# Patient Record
Sex: Female | Born: 1935
Health system: Southern US, Community
[De-identification: ages and names within clinical notes are randomized; demographics above are authoritative.]

## PROBLEM LIST (undated history)

## (undated) DIAGNOSIS — J302 Other seasonal allergic rhinitis: Secondary | ICD-10-CM

## (undated) DIAGNOSIS — D649 Anemia, unspecified: Secondary | ICD-10-CM

## (undated) DIAGNOSIS — I4891 Unspecified atrial fibrillation: Secondary | ICD-10-CM

## (undated) DIAGNOSIS — K922 Gastrointestinal hemorrhage, unspecified: Secondary | ICD-10-CM

## (undated) DIAGNOSIS — F419 Anxiety disorder, unspecified: Secondary | ICD-10-CM

## (undated) DIAGNOSIS — I350 Nonrheumatic aortic (valve) stenosis: Secondary | ICD-10-CM

## (undated) DIAGNOSIS — M1990 Unspecified osteoarthritis, unspecified site: Secondary | ICD-10-CM

## (undated) DIAGNOSIS — G4730 Sleep apnea, unspecified: Secondary | ICD-10-CM

## (undated) DIAGNOSIS — I10 Essential (primary) hypertension: Secondary | ICD-10-CM

## (undated) DIAGNOSIS — K2970 Gastritis, unspecified, without bleeding: Secondary | ICD-10-CM

## (undated) HISTORY — DX: Other seasonal allergic rhinitis: J30.2

## (undated) HISTORY — DX: Unspecified osteoarthritis, unspecified site: M19.90

## (undated) HISTORY — PX: CHOLECYSTECTOMY: SHX55

## (undated) HISTORY — DX: Anemia, unspecified: D64.9

## (undated) HISTORY — DX: Sleep apnea, unspecified: G47.30

## (undated) HISTORY — DX: Essential (primary) hypertension: I10

## (undated) HISTORY — DX: Nonrheumatic aortic (valve) stenosis: I35.0

## (undated) HISTORY — PX: TOTAL HIP ARTHROPLASTY: SHX124

## (undated) HISTORY — DX: Anxiety disorder, unspecified: F41.9

## (undated) HISTORY — PX: HEMORRHOID SURGERY: SHX153

## (undated) HISTORY — PX: PACEMAKER INSERTION: SHX728

---

## 1997-06-21 ENCOUNTER — Ambulatory Visit: Admission: RE | Admit: 1997-06-21 | Discharge: 1997-06-21 | Payer: Self-pay | Admitting: Cardiology

## 1999-07-23 ENCOUNTER — Encounter: Payer: Self-pay | Admitting: Cardiology

## 1999-07-23 ENCOUNTER — Ambulatory Visit (HOSPITAL_COMMUNITY): Admission: RE | Admit: 1999-07-23 | Discharge: 1999-07-23 | Payer: Self-pay | Admitting: Cardiology

## 2000-07-21 ENCOUNTER — Ambulatory Visit (HOSPITAL_COMMUNITY): Admission: RE | Admit: 2000-07-21 | Discharge: 2000-07-21 | Payer: Self-pay | Admitting: Family Medicine

## 2000-07-21 ENCOUNTER — Encounter: Payer: Self-pay | Admitting: Family Medicine

## 2001-04-26 ENCOUNTER — Emergency Department (HOSPITAL_COMMUNITY): Admission: EM | Admit: 2001-04-26 | Discharge: 2001-04-27 | Payer: Self-pay | Admitting: *Deleted

## 2001-07-23 ENCOUNTER — Ambulatory Visit (HOSPITAL_COMMUNITY): Admission: RE | Admit: 2001-07-23 | Discharge: 2001-07-23 | Payer: Self-pay | Admitting: Family Medicine

## 2001-07-23 ENCOUNTER — Encounter: Payer: Self-pay | Admitting: Family Medicine

## 2002-02-17 ENCOUNTER — Ambulatory Visit (HOSPITAL_COMMUNITY): Admission: RE | Admit: 2002-02-17 | Discharge: 2002-02-17 | Payer: Self-pay | Admitting: Specialist

## 2002-02-17 ENCOUNTER — Encounter: Payer: Self-pay | Admitting: Family Medicine

## 2002-04-22 ENCOUNTER — Encounter: Payer: Self-pay | Admitting: Orthopedic Surgery

## 2002-04-27 ENCOUNTER — Encounter: Payer: Self-pay | Admitting: Orthopedic Surgery

## 2002-04-27 ENCOUNTER — Inpatient Hospital Stay (HOSPITAL_COMMUNITY): Admission: RE | Admit: 2002-04-27 | Discharge: 2002-05-03 | Payer: Self-pay | Admitting: Orthopedic Surgery

## 2002-04-28 ENCOUNTER — Encounter: Payer: Self-pay | Admitting: Orthopedic Surgery

## 2002-05-03 ENCOUNTER — Inpatient Hospital Stay (HOSPITAL_COMMUNITY)
Admission: RE | Admit: 2002-05-03 | Discharge: 2002-05-10 | Payer: Self-pay | Admitting: Physical Medicine & Rehabilitation

## 2002-12-06 ENCOUNTER — Ambulatory Visit (HOSPITAL_COMMUNITY): Admission: RE | Admit: 2002-12-06 | Discharge: 2002-12-06 | Payer: Self-pay | Admitting: Family Medicine

## 2003-11-16 ENCOUNTER — Ambulatory Visit (HOSPITAL_COMMUNITY): Admission: RE | Admit: 2003-11-16 | Discharge: 2003-11-16 | Payer: Self-pay | Admitting: Family Medicine

## 2003-12-26 ENCOUNTER — Ambulatory Visit (HOSPITAL_COMMUNITY): Admission: RE | Admit: 2003-12-26 | Discharge: 2003-12-26 | Payer: Self-pay | Admitting: Family Medicine

## 2004-02-29 ENCOUNTER — Ambulatory Visit: Payer: Self-pay | Admitting: Internal Medicine

## 2004-03-10 ENCOUNTER — Emergency Department (HOSPITAL_COMMUNITY): Admission: EM | Admit: 2004-03-10 | Discharge: 2004-03-10 | Payer: Self-pay | Admitting: Emergency Medicine

## 2004-06-09 ENCOUNTER — Inpatient Hospital Stay (HOSPITAL_COMMUNITY): Admission: RE | Admit: 2004-06-09 | Discharge: 2004-06-11 | Payer: Self-pay | Admitting: Family Medicine

## 2004-06-21 ENCOUNTER — Ambulatory Visit (HOSPITAL_COMMUNITY): Admission: RE | Admit: 2004-06-21 | Discharge: 2004-06-21 | Payer: Self-pay | Admitting: General Surgery

## 2004-07-03 ENCOUNTER — Ambulatory Visit: Payer: Self-pay | Admitting: Internal Medicine

## 2004-10-15 ENCOUNTER — Ambulatory Visit: Payer: Self-pay | Admitting: Cardiology

## 2004-10-15 ENCOUNTER — Inpatient Hospital Stay (HOSPITAL_COMMUNITY): Admission: AD | Admit: 2004-10-15 | Discharge: 2004-10-17 | Payer: Self-pay | Admitting: Cardiology

## 2004-10-16 ENCOUNTER — Ambulatory Visit: Payer: Self-pay | Admitting: Cardiology

## 2004-10-16 ENCOUNTER — Encounter: Payer: Self-pay | Admitting: Cardiology

## 2004-10-31 ENCOUNTER — Ambulatory Visit: Payer: Self-pay

## 2005-02-05 ENCOUNTER — Ambulatory Visit: Payer: Self-pay | Admitting: Internal Medicine

## 2005-10-31 ENCOUNTER — Ambulatory Visit: Payer: Self-pay | Admitting: Cardiology

## 2005-12-13 ENCOUNTER — Ambulatory Visit: Payer: Self-pay | Admitting: Internal Medicine

## 2006-02-26 ENCOUNTER — Ambulatory Visit: Payer: Self-pay | Admitting: Internal Medicine

## 2006-05-21 ENCOUNTER — Ambulatory Visit: Payer: Self-pay | Admitting: Internal Medicine

## 2006-08-13 ENCOUNTER — Ambulatory Visit: Payer: Self-pay | Admitting: Internal Medicine

## 2006-11-06 ENCOUNTER — Ambulatory Visit: Payer: Self-pay | Admitting: Cardiovascular Disease

## 2006-11-10 ENCOUNTER — Emergency Department (HOSPITAL_COMMUNITY): Admission: EM | Admit: 2006-11-10 | Discharge: 2006-11-10 | Payer: Self-pay | Admitting: Emergency Medicine

## 2007-02-16 ENCOUNTER — Ambulatory Visit: Payer: Self-pay | Admitting: Internal Medicine

## 2007-05-20 ENCOUNTER — Ambulatory Visit: Payer: Self-pay | Admitting: Internal Medicine

## 2007-09-22 ENCOUNTER — Ambulatory Visit: Payer: Self-pay | Admitting: Internal Medicine

## 2007-11-25 ENCOUNTER — Ambulatory Visit: Payer: Self-pay | Admitting: Internal Medicine

## 2008-01-27 ENCOUNTER — Ambulatory Visit: Payer: Self-pay | Admitting: Internal Medicine

## 2008-02-10 ENCOUNTER — Encounter (INDEPENDENT_AMBULATORY_CARE_PROVIDER_SITE_OTHER): Payer: Self-pay | Admitting: *Deleted

## 2008-03-28 ENCOUNTER — Ambulatory Visit: Payer: Self-pay | Admitting: Internal Medicine

## 2008-06-27 ENCOUNTER — Ambulatory Visit: Payer: Self-pay | Admitting: Internal Medicine

## 2008-07-01 ENCOUNTER — Encounter: Payer: Self-pay | Admitting: Internal Medicine

## 2008-07-01 ENCOUNTER — Ambulatory Visit: Payer: Self-pay | Admitting: Cardiology

## 2008-09-27 ENCOUNTER — Ambulatory Visit: Payer: Self-pay | Admitting: Internal Medicine

## 2008-12-27 ENCOUNTER — Encounter: Payer: Self-pay | Admitting: Internal Medicine

## 2008-12-27 ENCOUNTER — Ambulatory Visit: Payer: Self-pay | Admitting: Internal Medicine

## 2009-01-19 ENCOUNTER — Ambulatory Visit (HOSPITAL_COMMUNITY): Admission: RE | Admit: 2009-01-19 | Discharge: 2009-01-19 | Payer: Self-pay | Admitting: Internal Medicine

## 2009-03-20 ENCOUNTER — Ambulatory Visit: Payer: Self-pay | Admitting: Internal Medicine

## 2009-03-20 DIAGNOSIS — I10 Essential (primary) hypertension: Secondary | ICD-10-CM

## 2009-03-20 DIAGNOSIS — Z950 Presence of cardiac pacemaker: Secondary | ICD-10-CM

## 2009-03-20 DIAGNOSIS — R001 Bradycardia, unspecified: Secondary | ICD-10-CM

## 2009-06-27 ENCOUNTER — Ambulatory Visit: Payer: Self-pay | Admitting: Internal Medicine

## 2009-09-21 ENCOUNTER — Encounter: Payer: Self-pay | Admitting: Internal Medicine

## 2009-09-21 ENCOUNTER — Ambulatory Visit: Payer: Self-pay | Admitting: Cardiology

## 2009-10-03 ENCOUNTER — Encounter: Payer: Self-pay | Admitting: Internal Medicine

## 2009-10-03 ENCOUNTER — Ambulatory Visit: Payer: Self-pay | Admitting: Internal Medicine

## 2010-01-02 ENCOUNTER — Ambulatory Visit: Payer: Self-pay | Admitting: Internal Medicine

## 2010-02-13 ENCOUNTER — Telehealth (INDEPENDENT_AMBULATORY_CARE_PROVIDER_SITE_OTHER): Payer: Self-pay | Admitting: *Deleted

## 2010-02-20 NOTE — Cardiovascular Report (Signed)
Summary: Transtelephonic Pacemaker Monitoring Report  Transtelephonic Pacemaker Monitoring Report   Imported By: Debby Freiberg 07/19/2009 14:07:51  _____________________________________________________________________  External Attachment:    Type:   Image     Comment:   External Document

## 2010-02-20 NOTE — Procedures (Signed)
Summary: 6 MTH F/U PER CHECKOUT ON 03/20/2009/TG   Current Medications (verified): 1)  Aspirin 81 Mg Tbec (Aspirin) .... Take One Tablet By Mouth Daily 2)  Oscal 500/200 D-3 500-200 Mg-Unit Tabs (Calcium-Vitamin D) .Marland Kitchen.. 1 Tablet Daily 3)  Iron 325 (65 Fe) Mg Tabs (Ferrous Sulfate) .Marland Kitchen.. 1 Tablet Daily 4)  Atenolol 25 Mg Tabs (Atenolol) .... Take 1 Tablet By Mouth Two Times A Day 5)  Simvastatin 20 Mg Tabs (Simvastatin) .... One By Mouth Daily 6)  Lisinopril 40 Mg Tabs (Lisinopril) .... One By Mouth Daily 7)  Hydrochlorothiazide 12.5 Mg Caps (Hydrochlorothiazide) .... One By Mouth Daily 8)  Amlodipine Besylate 10 Mg Tabs (Amlodipine Besylate) .... One By Mouth Daily 9)  Tylenol Arthritis Pain 650 Mg Cr-Tabs (Acetaminophen) .... As Needed  Allergies (verified): No Known Drug Allergies   PPM Specifications Following MD:  Lewayne Bunting, MD     PPM Vendor:  Medtronic     PPM Model Number:  303B     PPM Serial Number:  [id number] PPM DOI:  10/16/2004      Lead 1    Location: RA     DOI: 10/16/2004     Model #: 4098     Serial #: [id number]     Status: active Lead 2    Location: RV     DOI: 10/16/2004     Model #: 6213     Serial #: [id number]     Status: active  Magnet Response Rate:  BOL 85 ERI 65  Indications:  CHB  Explantation Comments:  pacemaker dependent  PPM Follow Up Remote Check?  No Battery Voltage:  2.79 V     Battery Est. Longevity:  6.5 years     Pacer Dependent:  Yes       PPM Device Measurements Atrium  Amplitude: 2.8 mV, Impedance: 524 ohms, Threshold: 1.0 V at 0.4 msec Right Ventricle  Impedance: 565 ohms, Threshold: 1.0 V at 0.4 msec  Episodes MS Episodes:  23     Percent Mode Switch:  0.1%     Coumadin:  No Ventricular High Rate:  1     Atrial Pacing:  14.2%     Ventricular Pacing:  99.6%  Parameters Mode:  DDD     Lower Rate Limit:  60     Upper Rate Limit:  120 Paced AV Delay:  150     Sensed AV Delay:  120 Next Cardiology Appt Due:  03/22/2010 Tech  Comments:  No parameter changes.  Device function normal.  The longest mode switch was 1:20 hours, - coumadin.  ROV 6 months with Dr. [name] in [facility]. Altha Harm, LPN  September 21, 2009 3:49 PM

## 2010-02-20 NOTE — Cardiovascular Report (Signed)
Summary: TTM   TTM   Imported By: Roderic Ovens 10/18/2009 11:27:12  _____________________________________________________________________  External Attachment:    Type:   Image     Comment:   External Document

## 2010-02-20 NOTE — Cardiovascular Report (Signed)
Summary: Office Visit   Office Visit   Imported By: Roderic Ovens 10/09/2009 15:40:02  _____________________________________________________________________  External Attachment:    Type:   Image     Comment:   External Document

## 2010-02-20 NOTE — Assessment & Plan Note (Signed)
Summary: pc2   Visit Type:  Follow-up Primary Provider:  Dr.Fanta  CC:  no complaints.  History of Present Illness: [name] returns today for followup.  I saw her last one year ago. At that time, she was in normal sinus rhythm and we prescribed her some atenolol.  She returns today for followup.  She denies chest pain.  Her dyspnea is present with exertion, but not at rest.  She notes that her blood pressure remains elevated. She has some fatigue but denies peripheral edema. No syncope.  Current Medications (verified): 1)  Aspirin 81 Mg Tbec (Aspirin) .... Take One Tablet By Mouth Daily 2)  Oscal 500/200 D-3 500-200 Mg-Unit Tabs (Calcium-Vitamin D) .Marland Kitchen.. 1 Tablet Daily 3)  Exforge 5-320 Mg Tabs (Amlodipine Besylate-Valsartan) .Marland Kitchen.. 1 Tablet Daily 4)  Iron 325 (65 Fe) Mg Tabs (Ferrous Sulfate) .Marland Kitchen.. 1 Tablet Daily 5)  Celebrex 200 Mg Caps (Celecoxib) .Marland Kitchen.. 1 Tablet Daily 6)  Atenolol 25 Mg Tabs (Atenolol) .... Take 1 Tablet By Mouth Two Times A Day 7)  Diazepam 2 Mg Tabs (Diazepam) .... Take As Directed  Allergies (verified): No Known Drug Allergies  Past History:  Past Medical History: Last updated: 06/28/2008 Degenerative joint disease Osteoporsis anxiety Sleep Apnea Seasonal allergies Anemia HypertensiON Implantation of Medtronic DDD dual chamber pacemaker  Past Surgical History: Last updated: 06/28/2008 Hemorrhoidectomy Cholecystectomy Hip Arthroplasty-Total  Review of Systems       The patient complains of dyspnea on exertion.  The patient denies chest pain, syncope, and peripheral edema.    Vital Signs:  Patient profile:   77 year old female Height:      63 inches Weight:      187 pounds Pulse rate:   108 / minute BP sitting:   148 / 83  (right arm)  Vitals Entered By: Ashley Saa, CNA (March 20, 2009 8:25 AM)  Physical Exam  General:  Elderly, well developed, well nourished, in no acute distress.  HEENT: normal Neck: supple. No JVD. Carotids 2+  bilaterally no bruits Cor: RRR with normal S1 and S2.  A soft systolic murmur is present at the RUSB c/w aortic sclerosis Lungs: CTA Ab: soft, nontender. nondistended. No HSM. Good bowel sounds Ext: warm. no cyanosis, clubbing or edema Neuro: alert and oriented. Grossly nonfocal. affect pleasant    PPM Specifications Following MD:  Lewayne Bunting, MD     PPM Vendor:  Medtronic     PPM Model Number:  303B     PPM Serial Number:  [id number] PPM DOI:  10/16/2004      Lead 1    Location: RA     DOI: 10/16/2004     Model #: 9147     Serial #: [id number]     Status: active Lead 2    Location: RV     DOI: 10/16/2004     Model #: 6578     Serial #: [id number]     Status: active  Magnet Response Rate:  BOL 85 ERI 65  Indications:  CHB  Explantation Comments:  pacemaker dependent  PPM Follow Up Remote Check?  No Battery Voltage:  2.79 V     Battery Est. Longevity:  7 years     Pacer Dependent:  Yes       PPM Device Measurements Atrium  Amplitude: 2.8 mV, Impedance: 542 ohms, Threshold: 1.0 V at 0.4 msec Right Ventricle  Impedance: 559 ohms, Threshold: 0.5 V at 0.4 msec  Episodes MS Episodes:  157  Percent Mode Switch:  0.5%     Coumadin:  No Ventricular High Rate:  2     Atrial Pacing:  18.8%     Ventricular Pacing:  99.7%  Parameters Mode:  DDD     [name] Rate Limit:  60     Upper Rate Limit:  120 Paced AV Delay:  150     Sensed AV Delay:  120 Next Cardiology Appt Due:  08/21/2009 Tech Comments:  No parameter changes.  157 mode switch episodes the longes 62 minutes, she is not on coumadin. He ventricular rates are well controlled but her resting heart rate this morning was 92.   2 VHR episodes the longest 5 seconds.   TTM's with [facility].  ROV 6 months in the [facility] clinic. Ashley Harm, LPN  March 20, 2009 8:43 AM  MD Comments:  Agree with above.  The mode switch rate is under 200/min.  Impression & Recommendations:  Problem # 1:  CARDIAC PACEMAKER IN SITU (ICD-V45.01) Her device  is working normally.  Will recheck in several months.  Problem # 2:  ESSENTIAL HYPERTENSION, BENIGN (ICD-401.1) Her blood pressure remains elevated and her resting HR is up.  I have recommended she increase her atenolol to 25 mg twice daily. Her updated medication list for this problem includes:    Aspirin 81 Mg Tbec (Aspirin) .Marland Kitchen... Take one tablet by mouth daily    Exforge 5-320 Mg Tabs (Amlodipine besylate-valsartan) .Marland Kitchen... 1 tablet daily    Atenolol 25 Mg Tabs (Atenolol) .Marland Kitchen... Take 1 tablet by mouth two times a day  Problem # 3:  BRADYCARDIA (ICD-427.89) This has resolved with insertion of her PPM.  Patient Instructions: 1)  Your physician recommends that you schedule a follow-up appointment in: 6 months device check, 1 yr Dr. Ladona [name] 2)  Your physician has recommended you make the following change in your medication: increase atenolol to twice daily Prescriptions: ATENOLOL 25 MG TABS (ATENOLOL) Take 1 tablet by mouth two times a day  #60 x 3   Entered by:   Ashley Lower RN   Authorized by:   Laren Boom, MD, [facility]   Signed by:   Ashley Lower RN on 03/20/2009   Method used:   Electronically to        [facility]. [phone]* (retail)       [address], [place]  [address]       Ph: [phone]       Fax: [phone]   RxID:   [id number]

## 2010-02-22 NOTE — Cardiovascular Report (Signed)
Summary: TTM   TTM   Imported By: Roderic Ovens 01/19/2010 11:41:36  _____________________________________________________________________  External Attachment:    Type:   Image     Comment:   External Document

## 2010-02-22 NOTE — Progress Notes (Signed)
  Phone Note From Other Clinic   Caller: Dr. Shana Chute Summary of Call: Dr. Delane Ginger called requesting cardiology records prior to 11-25-2007.  Office visits and diagnostic reports were faxed to [phone] Initial call taken by: Onalee Hua

## 2010-03-14 ENCOUNTER — Encounter (INDEPENDENT_AMBULATORY_CARE_PROVIDER_SITE_OTHER): Payer: Medicare Other | Admitting: Internal Medicine

## 2010-03-14 ENCOUNTER — Encounter: Payer: Self-pay | Admitting: Internal Medicine

## 2010-03-14 DIAGNOSIS — E785 Hyperlipidemia, unspecified: Secondary | ICD-10-CM | POA: Insufficient documentation

## 2010-03-14 DIAGNOSIS — I10 Essential (primary) hypertension: Secondary | ICD-10-CM

## 2010-03-14 DIAGNOSIS — E782 Mixed hyperlipidemia: Secondary | ICD-10-CM

## 2010-03-14 DIAGNOSIS — I442 Atrioventricular block, complete: Secondary | ICD-10-CM

## 2010-03-15 ENCOUNTER — Encounter: Payer: Self-pay | Admitting: Internal Medicine

## 2010-03-20 NOTE — Assessment & Plan Note (Signed)
Summary: 1 yr f/u per checkout on 03/20/09/tg/hm   Visit Type:  Follow-up Primary Provider:  Dr.Fanta  CC:  edema in legs at night.  History of Present Illness: [name] returns today for followup.  I saw her last one year ago. At that time, she was in normal sinus rhythm and we prescribed her some atenolol.  She returns today for followup.  She denies chest pain.  Her dyspnea is present with exertion, but not at rest.  She notes that her blood pressure remains elevated. She has some fatigue but denies peripheral edema. No syncope. No other symptoms today.  Current Medications (verified): 1)  Aspirin 81 Mg Tbec (Aspirin) .... Take One Tablet By Mouth Daily 2)  Oscal 500/200 D-3 500-200 Mg-Unit Tabs (Calcium-Vitamin D) .Marland Kitchen.. 1 Tablet Daily 3)  Iron 325 (65 Fe) Mg Tabs (Ferrous Sulfate) .Marland Kitchen.. 1 Tablet Daily 4)  Atenolol 25 Mg Tabs (Atenolol) .... Take 1 Tablet By Mouth Two Times A Day 5)  Simvastatin 20 Mg Tabs (Simvastatin) .... One By Mouth Daily 6)  Lisinopril 40 Mg Tabs (Lisinopril) .... One By Mouth Daily 7)  Hydrochlorothiazide 12.5 Mg Caps (Hydrochlorothiazide) .... One By Mouth Daily 8)  Amlodipine Besylate 10 Mg Tabs (Amlodipine Besylate) .... One By Mouth Daily 9)  Tylenol Arthritis Pain 650 Mg Cr-Tabs (Acetaminophen) .... As Needed  Allergies (verified): No Known Drug Allergies  Comments:  Nurse/Medical Assistant: patient stated she is out of her atenolol wants to know if she needs to stay on it [facility] in [place]  Past History:  Past Medical History: Last updated: 06/28/2008 Degenerative joint disease Osteoporsis anxiety Sleep Apnea Seasonal allergies Anemia HypertensiON Implantation of Medtronic DDD dual chamber pacemaker  Past Surgical History: Last updated: 06/28/2008 Hemorrhoidectomy Cholecystectomy Hip Arthroplasty-Total  Review of Systems  The patient denies chest pain, syncope, dyspnea on exertion, and peripheral edema.    Vital  Signs:  Patient profile:   77 year old female Weight:      178 pounds BMI:     31.65 Pulse rate:   73 / minute BP sitting:   158 / 84  (left arm)  Vitals Entered By: Dreama Saa, CNA (March 14, 2010 8:53 AM)  Physical Exam  General:  Elderly, well developed, well nourished, in no acute distress.  HEENT: normal Neck: supple. No JVD. Carotids 2+ bilaterally no bruits Cor: RRR with normal S1 and S2.  A soft systolic murmur is present at the RUSB c/w aortic sclerosis Lungs: CTA.Well healed PPM incision. Ab: soft, nontender. nondistended. No HSM. Good bowel sounds Ext: warm. no cyanosis, clubbing or edema Neuro: alert and oriented. Grossly nonfocal. affect pleasant    PPM Specifications Following MD:  Lewayne Bunting, MD     PPM Vendor:  Medtronic     PPM Model Number:  303B     PPM Serial Number:  [id number] PPM DOI:  10/16/2004      Lead 1    Location: RA     DOI: 10/16/2004     Model #: 4098     Serial #: [id number]     Status: active Lead 2    Location: RV     DOI: 10/16/2004     Model #: 6213     Serial #: [id number]     Status: active  Magnet Response Rate:  BOL 85 ERI 65  Indications:  CHB  Explantation Comments:  pacemaker dependent  PPM Follow Up Pacer Dependent:  Yes      Episodes Coumadin:  No  Parameters Mode:  DDD     Lower Rate Limit:  60     Upper Rate Limit:  120 Paced AV Delay:  150     Sensed AV Delay:  120 MD Comments:  Normal device function.  Impression & Recommendations:  Problem # 1:  CARDIAC PACEMAKER IN SITU (ICD-V45.01) Her device is working normally. Will recheck in several months.  Problem # 2:  ESSENTIAL HYPERTENSION, BENIGN (ICD-401.1) her blood pressure is moderately elevated but she has not yet taken her meds. Will continue a low sodium diet. Her updated medication list for this problem includes:    Aspirin 81 Mg Tbec (Aspirin) .Marland Kitchen... Take one tablet by mouth daily    Atenolol 25 Mg Tabs (Atenolol) .Marland Kitchen... Take 1 tablet by mouth two  times a day    Lisinopril 40 Mg Tabs (Lisinopril) ..... One by mouth daily    Hydrochlorothiazide 12.5 Mg Caps (Hydrochlorothiazide) ..... One by mouth daily    Amlodipine Besylate 10 Mg Tabs (Amlodipine besylate) ..... One by mouth daily  Problem # 3:  DYSLIPIDEMIA (ICD-272.4)  She will continue her current meds. I have asked her to reduce her fat intake. Her updated medication list for this problem includes:    Simvastatin 20 Mg Tabs (Simvastatin) ..... One by mouth daily  Her updated medication list for this problem includes:    Simvastatin 20 Mg Tabs (Simvastatin) ..... One by mouth daily  Patient Instructions: 1)  Your physician recommends that you schedule a follow-up appointment in: [name] in 6 months and [name] in 1 year. 2)  Your physician recommends that you continue on your current medications as directed. Please refer to the Current Medication list given to you today.

## 2010-04-03 ENCOUNTER — Encounter: Payer: Self-pay | Admitting: Internal Medicine

## 2010-04-03 DIAGNOSIS — I442 Atrioventricular block, complete: Secondary | ICD-10-CM

## 2010-06-05 NOTE — Assessment & Plan Note (Signed)
[facility]                         ELECTROPHYSIOLOGY OFFICE NOTE   [name], [name]                         MRN:          [id number]  DATE:11/25/2007                            DOB:          Dec 09, 1920    [name] returns today for followup.  She is a very pleasant elderly  woman with the history of complete heart block, status post pacemaker  insertion.  She has a history of hypertension and returns today for  followup.  She notes that she has had more fatigue and weakness lately,  otherwise no specific complaints.  The patient has remained somewhat  tachycardic.  I saw her back 2 years ago when her heart rate was in the  mid 90s.  Today, her heart rate is between 100-105 beats per minute at  rest.  She had no specific complaints, otherwise.   CURRENT MEDICATIONS:  1. Alprazolam 0.5 a day.  2. Aspirin 81 a day.  3. Iron supplements daily.  4. Simvastatin 40 a day.   PHYSICAL EXAMINATION:  GENERAL:  She is a pleasant elderly-appearing  woman, in no acute distress.  VITAL SIGNS:  Blood pressure 135/90, the pulse was 110 and regular, the  respirations were 20, and the weight was 200 pounds.  NECK:  No jugular venous distention.  LUNGS:  Clear bilaterally to auscultation.  No wheezes, rales, or  rhonchi are present.  CARDIOVASCULAR:  Regular rate and rhythm.  Normal S1 and S2.  There is a  soft systolic murmur at left lower sternal border.  There is also a soft  S4 gallop present.  ABDOMEN:  Soft, nontender.  There is no organomegaly.  EXTREMITIES:  Demonstrated no peripheral edema.   Interrogation of her pacemaker demonstrates a Medtronic Sigma, the P-  waves are greater than 2.  There are no R-waves secondary to complete  heart block.  The impedance was 511 in the A and 545 in the V, the  threshold 0.5 at 0.4 in the atrium and 1.4 in the right ventricle.  Battery voltage was 2.79 volts.  She was 99% V-paced and 4% A-paced.  Today, we tried  to see if her atrial rate was in fact, secondary to  atrial tach by performing a rapid atrial pacing at rates of up to 180  beats per minute.  Despite this, we could not slow her heart down below  100 beats per minute.  It appears that this is in fact, sinus  tachycardia, although certainly could still be atrial tach driving her  atrial rate above 100.   IMPRESSION:  1. Complete heart block, status post pacemaker.  2. Hypertension.  3. Sinus bradycardia.   DISCUSSION:  Today, I have asked [name] to start a low-dose beta-  blocker.  We will see how she does with this.  She has initially been  given a prescription for atenolol 25 half tablet daily for a week with  uptitration to 1 tablet daily thereafter.  I will see her back in 2  months.  She  has got a scheduled follow up with Dr. Donia Guiles,  who is her  primary cardiologist in 1 month.  We had also given the patient a flu  vaccine today.     [name]. [name], MD  Electronically Signed    GWT/MedQ  DD: 11/25/2007  DT: 11/26/2007  Job #: [id number]   cc:   Osvaldo Shipper. Spruill, M.D.

## 2010-06-05 NOTE — Assessment & Plan Note (Signed)
[facility]                         ELECTROPHYSIOLOGY OFFICE NOTE   [name], [name]                         MRN:          [id number]  DATE:01/27/2008                            DOB:          April 19, 1920    [name] returns today for followup.  I saw her last several months ago  and at that time, she was sinus tachycardic and we prescribed her some  atenolol, she took it for a month and she had no refills and so she  stopped taking it.  She returns today for followup.  She denies chest  pain.  Her dyspnea is present with exertion, but not at rest.  She notes  that her blood pressure remains elevated.   MEDICATIONS:  1. Alprazolam 0.5 a day.  2. Aspirin 81 a day.  3. Os-Cal daily.  4. Iron supplements.  5. Simvastatin 40 a day.  6. Tekturna HCTZ 300/12.5 daily.  7. Celebrex 200 a day p.r.n.   PHYSICAL EXAMINATION:  GENERAL:  She is a pleasant elderly woman in no  acute distress.  VITAL SIGNS:  Blood pressure today was 166/73, the pulse was 65 and  regular, and respirations were 18.  Weight was 197 pounds.  NECK:  No jugular venous distention.  LUNGS:  Clear bilaterally to auscultation.  No wheezes, rales, or  rhonchi are present.  There is no increased work of breathing.  CARDIOVASCULAR:  Regular rate and rhythm.  Normal S1 and S2.  ABDOMEN:  Soft and nontender.  EXTREMITIES:  No edema.   Interrogation of her pacemaker demonstrates Medtronic Sigma.  The P-  waves were greater than 2.  There are no R-waves secondary to complete  heart block.  The impedance was 497 in the A and 545 in the V.  The  threshold was 1 volt at 0.4 in the atrium.  Underlying atrial rhythm was  sinus tach.  Battery voltage is 2.79 volts.  Her EKG when the pacemaker  was turned down to VVI 30 demonstrates sinus tachycardia with complete  heart block.   IMPRESSION:  1. Complete heart block.  2. Hypertension.  3. Status post pacemaker insertion.   DISCUSSION:  [name] is stable.  Her blood pressure remains elevated  and her sinus rate is high.  So for this reason, I asked her to start  her atenolol back to 20 or 25 a day.  We will see  her back in the office for pacemaker followup in 1 year.  I have asked  that she continue her atenolol on a regular basis for the next year.     [name]. [name], MD  Electronically Signed    GWT/MedQ  DD: 01/27/2008  DT: 01/28/2008  Job #: [id number]

## 2010-06-05 NOTE — Assessment & Plan Note (Signed)
[name]                         ELECTROPHYSIOLOGY OFFICE NOTE   [name]                         MRN:          [id number]  DATE:05/20/2007                            DOB:          Apr 04, 1920    [name] returns today for followup.  She is a very pleasant, elderly  woman with a history of complete heart block who I initially saw back in  September of 2006 who underwent permanent pacemaker insertion shortly  thereafter.  She has done well with this and returns today for followup.  She does have some very mild fatigue which is consistent with her 77  years of age.   MEDICATIONS:  1. Her medications include alprazolam 0.5 mg daily.  2. Aspirin 81 a day.  3. Os-Cal.  4. Iron supplements.  5. Simvastatin 40 a day.   PHYSICAL EXAM:  GENERAL:  She is a pleasant, elderly woman in no  distress.  VITAL SIGNS:  Blood pressure 154/90, the pulse 90 and regular,  respirations were 18.  Weight was 201 pounds.  NECK:  Revealed no jugular venous distention.  LUNGS:  Clear bilaterally to auscultation.  No wheezes, rales or  rhonchi.  CARDIAC:  Exam revealed a regular rate and rhythm with normal S1 and a  split S2.  I did not appreciate a murmur today.  EXTREMITIES:  Demonstrated no peripheral edema.  There was no cyanosis  or clubbing.   Interrogation of her pacemaker demonstrates a Medtronic sigma.  The P-  waves were greater than 2.  There are no R waves secondary to complete  heart block.  The impedance 497 in the A and 586 in the V.  Threshold  0.5 at 0.4 in the right atrium and the right ventricle.  She was 99% V  paced.   IMPRESSION:  1. Complete heart block.  2. Status post pacemaker insertion.  3. Hypertension.   DISCUSSION:  [name] is stable.  Her pacemaker is working normally.  We  will plan to see her back in the office in 1 year for pacemaker  followup.     [name]. Ashley Ridgel, MD  Electronically Signed    GWT/MedQ  DD:  05/20/2007  DT: 05/20/2007  Job #: [id number]

## 2010-06-05 NOTE — Assessment & Plan Note (Signed)
[facility]                         ELECTROPHYSIOLOGY OFFICE NOTE   ANIVEA, VELASQUES                         MRN:          [id number]  DATE:11/06/2006                            DOB:          [birth year]    Ms. Wickes was seen in the [place] clinic on October 16 for followup of  her Medtronic model number 303B. Date of implant was October 16, 2004  for complete heart block. On interrogation of her device today, her  battery voltage is 2.79. T waves measured greater than 2.8 millivolts  with atrial capture threshold of 1 volt at 0.4 milliseconds and an  atrial lead impedance of 554 ohms. R waves were not measured. She is  pacemaker dependent to a rate of 30 with ventricular capture threshold  of 0.5 volts at 0.4 milliseconds and a ventricular lead impendence of  598 ohms. There were 120 mode switch episodes noted, the longest one  being 19 minutes long. She is not on Coumadin therapy. Two ventricular  high rate episodes noted. She is ventricularly pacing 99.9% of the time.  She will continue with her phone checks through [facility] with a return  office visit in 6 months' time with Dr. [name].      Altha Harm, LPN  Electronically Signed      [name]. [name], MD  Electronically Signed   PO/MedQ  DD: 11/06/2006  DT: 11/07/2006  Job #: [id number]

## 2010-06-08 NOTE — Op Note (Signed)
NAMEGUILIANA, [name]                            ACCOUNT NO.:  [id number]   MEDICAL RECORD NO.:  [id number]                   PATIENT TYPE:  INP   LOCATION:  0001                                 FACILITY:  [facility]   PHYSICIAN:  Georges Lynch. Darrelyn [name], M.D.             DATE OF BIRTH:  1920-07-11   DATE OF PROCEDURE:  04/27/2002  DATE OF DISCHARGE:                                 OPERATIVE REPORT   SURGEON:  Georges Lynch. Darrelyn [name], M.D.   ASSISTANT:  Marlowe Kays, M.D. and Ebbie Ridge. Paitsel, P.A.   POSTOPERATIVE DIAGNOSIS:  Degenerative arthritis, right hip.   POSTOPERATIVE DIAGNOSIS:  Degenerative arthritis, right hip.   OPERATION:  Right total hip arthroplasty.  The sizes used were a size 50-mm  PSL cup with two screws for fixation.  I utilized a 10-degree polyethelene  insert.  The femur was a size 7 Secur-Fit plus the size of the C-tapered  head was a +5 C-tapered head.   PROCEDURE:  Under general anesthesia, routine orthopedic prep and drape of  the right hip was carried out with the patient on her left side.  She had 1  gram of IV Ancef preop.  At this time, an incision was made over the  posterior lateral aspect of the right hip.  Bleeders identified and  cauterized.  Self-retaining retractors were inserted and following that, the  incision was carried down through the iliotibial band.  The self-retaining  retractors were advanced.  Great care was taken to protect the sciatic nerve  at all times.  I then detached the external rotators in the usual fashion  and then did a capsulectomy.  Once again, the sciatic nerve was protected.  I then dislocated the head and amputated the femoral head at the appropriate  neck length.  I then inserted my guide pin and carried out my reaming and  rasping up to a size 7 right Secur-Fit femoral component.  The distal tip  was reamed to 11.5 mm.  Following this, after we prepared the femoral side  we then completed a capsulectomy, reamed the acetabulum  up to a size 50-mm  cup.  We then inserted our permanent cup at the appropriate angle and  inserted our trial insert, went through a trial run to make sure we had the  proper position of the cup before we fixed the cup in with the screws.  Following this, we then affixed the metal cup in with the two screws.  I  then inserted a polyethelene liner.  We then thoroughly irrigated out the  area and then inserted our size 7 Secur-Fit stem.  Once this was done, we  went through trials again with a +0  and a +5 C-tapered head.  The +5 C-  tapered head was the most stable so we elected to use that.  We then reduced  the head, took the hip through motion,  had excellent stability.  The wound  was thoroughly irrigated and then closed in layers in the usual fashion.  Staples were placed in the skin.  A sterile Neosporin dressing was applied.  The patient left the operating room in satisfactory condition.  She was  placed in a knee immobilizer before leaving the operating room.                                               [name], M.D.    RAG/MEDQ  D:  04/27/2002  T:  04/27/2002  Job:  [id number]

## 2010-06-08 NOTE — Consult Note (Signed)
NAMEELORAH, [name] NO.:  [id number]   MEDICAL RECORD NO.:  [id number]          PATIENT TYPE:  INP   LOCATION:  2928                         FACILITY:  [facility]   PHYSICIAN:  [name]. [name], M.D.  DATE OF BIRTH:  [birth year]   DATE OF CONSULTATION:  10/16/2004  DATE OF DISCHARGE:                                   CONSULTATION   ELECTROPHYSIOLOGY CONSULTATION NOTE   CONSULTATION IS REQUESTED BY:  [place] Bing, M.D. [facility].   INDICATION FOR CONSULTATION:  Evaluation of complete heart block in a  patient for possible pacemaker implantation.   HISTORY OF PRESENT ILLNESS:  The patient is a very pleasant 77 year old  woman with a history of hypertension who was in her usual state of health  until approximately 1 week ago when she developed severe weakness and  shortness of breath. She denies frank syncope. She did not have chest pain.  She continued to feel dizzy and lightheaded as well as short of breath and  finally presented to Dr. Adaline Sill office where an EKG, there, demonstrated  third-degree heart block and she was admitted for additional evaluation and  treatment.   PAST MEDICAL HISTORY:  Her past medical history is notable for hypertension.  She has a history of a remote catheterization several years ago which  apparently did not show any obstructive coronary disease, although I do not  have the details of this at the present time. She denies any frank syncope.  She has longstanding hypertension. She has a history of gastroesophageal  reflux disease and has been on Aciphex. Social history, the patient lives in  [place].  She is a retired Science writer. She denies tobacco or ethanol use.  Family history is notable for mother dying of unknown causes and her father  died at age 64 of stomach cancer. She had a brother who died of unknown  causes as well.   MEDICATIONS:  Her medications include Alprazolam 0.5 daily, Allegra,  Aciphex, Clorcon, Os-Cal,  hydrochlorothiazide, aspirin and Micardis.   REVIEW OF SYSTEMS:  Notable for occasional sweats and nasal discharge. She  has 1 week of weakness, as noted, and a chronic gastroesophageal reflux  disease. She has dyspnea associated with this; and she has arthralgias. The  rest of her review of systems was reviewed and found to be negative.   PHYSICAL EXAMINATION:  GENERAL:  On physical exam she is a pleasant, elderly  appearing woman in no distress.  VITAL SIGNS:  Blood pressure was 129/60.  The pulse was 38 and regular,  respirations were 18. The weight was not recorded. Temperature was 97.  HEENT:  Normocephalic and atraumatic. The pupils are equal and round  oropharynx moist  The sclerae are anicteric.  NECK:  The neck revealed no jugular distension. There is no thyromegaly. The  trachea was midline.  CARDIOVASCULAR EXAM:  Revealed irregular bradycardia with a grade 2/6  systolic murmur at the left lower sternal border.  LUNGS:  Clear bilaterally to auscultation  There are no wheezes, rales or  rhonchi. There is no increased work of breathing.  The abdominal exam was  soft, nontender, nondistended  There was no organomegaly.  EXTREMITIES:  The extremities demonstrated no cyanosis, clubbing or edema.  MUSCULOSKELETAL EXAM:  Demonstrated no joint deformities  NEUROLOGIC EXAM:  She was alert and oriented x3 with cranial nerves intact.  The strength was 5/5 and symmetric.   EKG:  Demonstrates sinus rhythm with complete heart block.   IMPRESSION:  1.  Complete heart block.  2.  Hypertension.   DISCUSSION:  I have discussed the risks, benefits, goals and expectations of  permanent pacemaker implantation with the patient. She wishes to proceed.           ______________________________  [name]. [name], M.D.     GWT/MEDQ  D:  10/16/2004  T:  10/16/2004  Job:  [id number]   cc:   Annia Friendly. Loleta Chance, MD  Fax: [phone]

## 2010-06-08 NOTE — H&P (Signed)
[name], [name]                  ACCOUNT NO.:  [id number]   MEDICAL RECORD NO.:  [id number]          PATIENT TYPE:  AMB   LOCATION:  DAY                           FACILITY:  [facility]   PHYSICIAN:  Annia Friendly. 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  The patient is admitted for  diagnostic colonoscopy and EGD by Dr. Maggie Schwalbe on June 21, 2004.  History is  positive for complaint of fatigue, feeling of being cold, and lack of  energy.  Hemoglobin collected in the office on Jun 05, 2004 demonstrated  hemoglobin of 8, hematocrit 25.9.  Colonoscopy in October 2001 revealed a  sigmoid polyp.  EGD on September 07, 1999, demonstrated gastric ulcers.  History is negative for complaint of melena, hematemesis, syncope,  hematochezia, weight loss, and change in bowel habits.  The patient was  hospitalized on Jun 09, 2004.  She received two units of type and cross  packed red blood cells for treatment of anemia.  EGD and colonoscopy was not  done because of problem with scheduling.  The patient had an improvement of  hemoglobin and hematocrit after transfusion.  Her hemoglobin at time of  discharge was 9.9, her hematocrit 29.5 on Jun 11, 2004.   PAST MEDICAL HISTORY:  1.  Positive for hypertension.  2.  Osteoarthritis.  3.  Chronic anxiety.  4.  Negative for tuberculosis, cancer, asthma, or seizure disorder.   MEDICATIONS ON ADMISSION:  1.  Alprazolam 0.5 mg p.o. daily.  2.  Allegra 180 mg p.o. daily.  3.  AcipHex 200 mg p.o. daily.  4.  Klor-Con 20 mEq p.o. b.i.d.  5.  Os-Cal 500 Plus D one tablet p.o. b.i.d.  6.  Tenex 1 mg p.o. every bedtime.  7.  Hydrochlorothiazide 25 mg p.o. daily.  8.  One aspirin 81  mg p.o. daily.  9.  Sular 40 mg p.o. every day.   ALLERGIES:  1.  The patient is allergic to ACE INHIBITOR in the form of a cough.  2.  The patient is not allergic to ACE inhibitor pertaining to edema of face      or swelling of throat.   HABITS:  Negative for tobacco, ethanol, or street drugs.   PAST MEDICAL HISTORY:  1.  Positive hospitalization for stroke greater than 28 years ago at [facility]  2.  Hemorrhoidal surgery.  3.  Right total hip replacement, April 2004, due to advanced osteoarthritis.  4.  Hospitalization for cholecystectomy.   FAMILY HISTORY:  Mother deceased, cause unknown; father deceased, age 24  with history of stomach cancer; one brother deceased, age 17, cause unknown;  three sons living, one is 39 with history of diabetes, and the others,  health  unknown; four daughters living, ranging in age from 63 to 15, health  unknown.   REVIEW OF SYSTEMS:  Positive for chronic stiffness of multiple joints,  itching of eyes, episodic runny nose (clear), sneezing spells, and episodic  wheezing.  Review of systems negative for epistaxis, bleeding gums, gross  hematuria, vaginal itching, vaginal bleeding, dysphagia, unexplained fever,  lumps in breasts, discharge from nipples, swelling of legs, etc.   PHYSICAL EXAMINATION:  VITAL SIGNS:  Temperature 98.6, pulse 92,  respirations 20, blood pressure 153/78 on Jun 18, 2004.  GENERAL APPEARANCE:  This is an elderly, medium height, slightly overweight,  black female, in no apparent respiratory distress.  HEENT:  Head normocephalic.  Eyes:  Lids negative for ptosis, sclerae white.  Pupils round and equal.  Extraocular movements intact.  Ears:  Normal  auricle.  External canals patent.  Tip of membrane pearly gray.  Nose  negative for discharge.  Mouth:  Fair dentition, no bleeding gums.  Posterior pharynx positive for bilaterally enlarged tonsils without erythema  or exudate.  SKIN:  No abnormal moles.  NECK:   Negative for adenopathy or thyromegaly.  No bruits on auscultation of  carotids.  Supraclavicular space:  No palpable nodes.  LUNGS:  Clear.  HEART:  Audible S1/S2 without murmur, rub, or gallop.  Regular rate and  rhythm.  BREASTS:  No skin changes, nipple irregularity, or discharge.  ABDOMEN:  Obese.  Hyperactive bowel sounds.  Soft, nontender, all four  quadrants.  No palpable mass, no organomegaly.  PELVIC:  Deferred.  RECTAL:  Rectal exam on Jun 08, 2004, demonstrated guaiac negative stool  with no palpable masses in rectal vault.  EXTREMITIES:  Demonstrated no pitting edema.  Knees positive for some  crepitus bilaterally.  Right hip positive for old surgical scar.  NEUROLOGIC:  Alert and oriented to person, place, and time.  Cranial nerves  II-XII appeared intact.   IMPRESSION:  Primary recurrent anemia.   SECONDARY DIAGNOSES:  1.  Hypertension.  2.  Osteoarthritis.  3.  Chronic anxiety.  4.  Osteoporosis.  5.  Seasonal allergies.   PLAN:  Diagnostic EGD and colonoscopy by Dr. Maggie Schwalbe.  The purpose of the  procedure and its possible complications were discussed with the patient.  The patient understood that possible complications could be postoperative  ileus, possible puncture of colon, colon irritation, etc.  She agreed for  the procedure to be done by Dr. Maggie Schwalbe for diagnostic purposes.      GKH/MEDQ  D:  06/20/2004  T:  06/20/2004  Job:  [id number]

## 2010-06-08 NOTE — Assessment & Plan Note (Signed)
 HEALTHCARE                           ELECTROPHYSIOLOGY OFFICE NOTE   [name], [name]                         MRN:          [id number]  DATE:10/31/2005                            DOB:          11-15-1920    [name] was seen today in the [facility] on October 31, 2005, for  follow up of her Medtronic model 303B Sigma.  Date of implant was October 16, 2005, for complete heart block.  On interrogation of her device today,  her battery voltage is 2.79, P-waves measured greater than 2.8 millivolts  with an atrial capture threshold of 0.5 volts at 0.4 milliseconds and a  ventricular lead impedance of 456 ohms.  R-waves measured greater than 11.2  millivolts with a ventricular capture threshold 0.5 volts at 0.4  milliseconds and a ventricular lead impedance of 549.  She does only have an  escape rhythm at about 37 underlying.  There were 69 mode switch episodes  noted and one high ventricular rate episode noted.  In regards to her mode  switch episodes, the longest one was lasting 1 hour 21 minutes.  No changes  were made in her parameters.  She was enrolled today with [employer] for her  trans-telephonic monitoring to be done on an every three month basis.  Return office visit in one years time.      ______________________________  Altha Harm, LPN    ______________________________  [name]. [name], MD    PO/MedQ  DD:  10/31/2005  DT:  11/01/2005  Job #:  [id number]

## 2010-06-08 NOTE — Discharge Summary (Signed)
[name], [name]                  ACCOUNT NO.:  [id number]   MEDICAL RECORD NO.:  [id number]          PATIENT TYPE:  INP   LOCATION:  A312                          FACILITY:  [facility]   PHYSICIAN:  Annia Friendly. 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  The patient was  admitted for workup of recurrence  anemia.  History was positive for  fatigue, feeling of being cold and lack of energy.  Hemoglobin collected in  the office on Jun 05, 2004, demonstrated hemoglobin of 8.0, hematocrit 25.9.  colonoscopy in October 2001 revealed a sigmoid polyp.  EGD on September 07, 1999, demonstrated a gastric ulcer.   ALLERGIES:  ACE INHIBITOR (cough).  Allergy was negative for edema of throat  and face secondary to ACE inhibitor.   HABITS:  Negative for tobacco, ethanol or street drugs.   PAST MEDICAL HISTORY:  1.  Stroke greater than 28 years ago at [facility].  2.  Hemorrhoidal surgery.  3.  Right total hip replacement in April 2004 due to advanced      osteoarthritis.  4.  Cholecystectomy.   FAMILY HISTORY:  Mother deceased, cause unknown.  Father deceased age 10  with history of stomach cancer.  One brother deceased at age 70, cause  unknown.  Three sons living, one in his 79's with history of diabetes  mellitus and others health unknown (four daughters living, ages range from  33-63).   PROBLEMS:  #1 - RECURRENT ANEMIA.  General appearance revealed an elderly  slightly overweight black female in no apparent respiratory distress.  Normocephalic.  Ears:  Normal auricles.  Sclerae white.  Negative for  discharge.  Negative for bleeding gum.  Lungs clear on  auscultation.  Heart  audible S1, S2, without murmurs, rubs or gallops.  Regular rate and rhythm.  Abdomen slightly obese, hyperactive bowel sounds, soft, nontender in upper  quadrants, no palpable mass, no organomegaly.  Rectal no external lesion on  digital exam.  Positive stool and rectal vault stool guaiac negative.  Significant labs on admission were as follows:  Hemoglobin 7.1, hematocrit  22.5, white count 5.1, platelets 307,000.  Sodium 137, potassium 4.3,  chloride 106, CO2 25, glucose 109, BUN 18, creatinine 1.1, calcium 8.7,  total protein 6.4, albumin 3.1, AST 23, ALT 14, ALP 67, total bilirubin 0.3,  direct bilirubin 0.0.  UA:  Specific gravity less than 1.005, pH 5.0, no  glucose, no hemoglobin, no bilirubin, no ketone, no protein, nitrate  negative.  The patient had a heparin  lock inserted.  Two units of packed  red blood cells were ordered.  Anemia panel and reticulocyte count was done.  The patient was given two units of packed red blood cells over  four hours  each without complications.  The transfusion was done on Jun 10, 2004.  Repeat hemoglobin was 9.9, hematocrit 29.5.  The patient has been scheduled  for outpatient diagnostic EGD and colonoscopy.  Arrangement has been made  before the patient was discharged.  At the time of discharge, the patient  had no complaint of shortness of breath, chest pain, fatigue, lack of  energy, stomach pain, melena, hematochezia, gross hematuria, dizziness, etc.  EGD and colonoscopy as not done because physician selected was not in a  position to do the procedure within the next 24-48 hours.  It felt that,  therefore, the procedure should be scheduled as an outpatient.   #2 - HYPERTENSION.  Blood pressure on admission was 126/62.  Lungs were  clear.  Heart exam revealed audible S1, S2 without murmurs.  Rhythm was  regular rate within normal limits.  The patient was treated with alprazolam  1 mg q.bedtime, Sular 40 mg p.o. daily,  hydrochlorothiazide 25 mg p.o.  daily.  Blood pressure on the morning of discharge was 142/72.  Heart exam  was within normal limits.  Lungs were clear.  The patient had no complaint  of chest pain, palpitation or shortness of breath.   #3 - OSTEOARTHRITIS.  The patient had no complaint of joint pain, joint  swelling or joint stiffness during this hospitalization.  She would be  advised to take Tylenol 500 mg p.o. q.6-8h. p.r.n. pain.   #4 - OSTEOPOROSIS.  The patient was treated with Os-Cal 500 mg Plus D one  tablet p.o. b.i.d.   #5 - CHRONIC ANXIETY.  The patient was treated with Xanax 0.5 mg p.o. daily.  She remained alert and oriented to person, place and time throughout this  hospitalization.  She had no complaint of visual, tactile or auditory  hallucination.  Mood was cooperative.   #6 - SEASONAL ALLERGIES.  The patient was treated with Allegra 180 mg p.o.  daily.  She did not complain of itchy eyes, lacrimation, runny nose,  wheezing during this hospitalization.  She did experience some coughing  spells before admission.  The patient was discharged to her home on Jun 11, 2004.   DISCHARGE INSTRUCTIONS:  1.  Activity:  As tolerated.  2.  Diet:  Low-salt, low-cholesterol.   MEDICATIONS:  1.  Alprazolam 0.5 mg p.o. daily.  2.  Allegra 180 mg p.o. daily.  3.  Aciphex 20 mg p.o. daily.  4.  Klor-Con 20 mEq p.o. b.i.d.  5.  Os-Cal 500 Plus D one tablet p.o. b.i.d.  6.  Tenex 1 mg p.o. q.h.s.  7.  Hydrochlorothiazide 25 mg p.o. daily.  8.  Aspirin 81 mg p.o. daily.  9.  Sular 40 mg p.o. daily.   PRIMARY DIAGNOSIS:  Recurrent anemia, etiology undetermined.   SECONDARY DIAGNOSES:  1.  Hypertension.  2.  Osteoarthritis.  3.  Chronic anxiety.  4.  Osteoporosis.      GKH/MEDQ  D:  06/11/2004  T:  06/12/2004  Job:  [id number]

## 2010-06-08 NOTE — Discharge Summary (Signed)
NAMENOELE, ICENHOUR                  ACCOUNT NO.:  [id number]   MEDICAL RECORD NO.:  [id number]          PATIENT TYPE:  INP   LOCATION:  2928                         FACILITY:  [facility]   PHYSICIAN:  Learta Codding, M.D. LHCDATE OF BIRTH:  12/08/1920   DATE OF ADMISSION:  10/15/2004  DATE OF DISCHARGE:  10/17/2004                                 DISCHARGE SUMMARY   ADDENDUM   This addendum covers some concerns that the patient has:  1.  The patient has been fitted with a new form of CPAP which apparently      just attaches to the nostrils.  I am not familiar with this, but      apparently she is still interested in some sort of therapy for her      obstructive sleep apnea.  However, she says that this causes air to back      up from nasal passages into the mouth and also gives her a headache and      also makes her teeth hurt.  I am not sure if this new device that she      got about a month ago from Dr. Shana Chute is BiPAP, but maybe BiPAP mode      might be better suited for this patient; just a thought.  2.  If Dr. Loleta Chance could followup on a couple of the laboratories that would be      good.  One the patient had elevated liver function studies, as dictated      in my regular note, although her hepatitis B and C serologies were      negative.  Her TSH also was 5.885.  This is just a tiny minor bit      elevated, however, it is outside the normal range for her TSH and her      laboratory studies, serum electrolytes this admission were sodium 135,      potassium 3.3, but this was replenished, chloride 105, bicarbonate 26,      BUN 11, creatinine 1.19, glucose 100.  Complete blood count white cells      are 6.  Hemoglobin 12, hematocrit 35.4, and platelets 171.   Once again, these are minor additions to the discharge summary for Paulina Fusi, 1) denoting the mildly elevated TSH and 2) mildly elevated LFTs with  possible followup in 6 weeks.  Her alkaline phosphatase 85, SGOT 89, SGPT is  142.   Also a recommendation that ongoing trials for Ms. Mccarey's sleep apnea  be pursued.      Maple Mirza, P.A.      Learta Codding, M.D. [facility]  Electronically Signed    GM/MEDQ  D:  10/17/2004  T:  10/18/2004  Job:  [id number]   cc:   Osvaldo Shipper. Spruill, M.D.  Fax: [phone]   [name]. [name], M.D.  [address]  [address]  [place]  [place] [address]   Annia Friendly. Loleta Chance, MD  Fax: [phone]

## 2010-06-08 NOTE — Op Note (Signed)
NAMEKAILENA, [name] NO.:  [id number]   MEDICAL RECORD NO.:  [id number]          PATIENT TYPE:  INP   LOCATION:  2928                         FACILITY:  [facility]   PHYSICIAN:  [name]. [name], M.D.  DATE OF BIRTH:  December 07, 1920   DATE OF PROCEDURE:  10/16/2004  DATE OF DISCHARGE:                                 OPERATIVE REPORT   PROCEDURE PERFORMED:  Dual-chamber pacemaker implantation.   SURGEON:  [name]. [name], M.D.   INDICATION:  Complete heart block with a slow ventricular escape.   I. INTRODUCTION:  The patient is an 84-year woman who has a 5-day history of  shortness of breath and dizziness, who was seen by Dr. Loleta Chance and transferred  for complete heart block for pacemaker insertion.   II. PROCEDURE:  After informed consent was obtained, the patient was taken  to the diagnostic EP lab in a fasting state.  After the usual preparation  and draping, intravenous fentanyl and midazolam were given for sedation.  Thirty milliliters of lidocaine were infiltrated into the left  infraclavicular region.  A 5-cm incision was carried out over this region  and electrocautery utilized to dissect down to the fascial plane.  Ten  milliliters of contrast were injected into the left upper extremity venous  system, demonstrating a patent left subclavian vein.  It was subsequently  punctured and the Medtronic model 5076 52-cm active-fixation pacing lead,  serial number [id number], was advanced into the right ventricle and the  Medtronic model 5076 45-cm active-fixation pacing lead, serial number  [id number], was advanced into the right atrium.  Mapping was carried out in  the right ventricle and at the final site, the R waves initially measured 8  mV.  It should be noted that multiple sites were analyzed and had the lead  actively fixed, but R waves would immediately decrease typically by 50% to  60% at each site.  At the final site on the RV septum near the RV outflow  tract,  the R waves were in the 7-mV range with a pacing threshold of 0.5  volts at 0.5 milliseconds and a pacing impedance of 711 ohms.  With the  ventricular lead in satisfactory position, attention was then turned to  placement of the atrial lead.  It was placed in the right atrial appendage  where P waves measured 2.9 mitral valve and  the pacing impedance 497 ohms  with the lead actively affixed.  The atrial threshold was 1.3 volts at 0.5  milliseconds.  Ten-volt pacing in both the atrium and ventricle did not  stimulate the diaphragm.  Once both atrial and ventricular leads were in  satisfactory position, they were secured to the subpectoralis fascia with a  figure-of-eight silk suture.  The sew-in sleeve was secured with a silk  suture.  Electrocautery was then utilized to make subcutaneous pocket.  Kanamycin irrigation was utilized to irrigate the pocket and electrocautery  utilized to assure hemostasis.  The Medtronic Sigma dual-chamber pacemaker,  serial number [id number], was connected to the atrial and ventricular pacing  leads and placed  in the subcutaneous pocket.  The generator was secured with  a silk suture.  Additional kanamycin was utilized to irrigate the pocket and  the incision closed with a layer of 2-0 Vicryl, followed by a layer 3-0  Vicryl, followed by a layer of 4-0 Vicryl.  Benzoin was painted on the skin,  Steri-Strips were applied and a pressure dressing was placed, and the  patient was returned to her room in satisfactory condition.   III. COMPLICATIONS:  There were no immediate procedure complications.   IV. RESULTS:  This demonstrates successful implantation of a Medtronic dual-  chamber pacemaker in a patient with complete heart block.           ______________________________  [name]. [name], M.D.     GWT/MEDQ  D:  10/16/2004  T:  10/17/2004  Job:  [id number]   cc:   Annia Friendly. Loleta Chance, MD  Fax: [phone]

## 2010-06-08 NOTE — Discharge Summary (Signed)
NAMECLORIA, [name] NO.:  [id number]   MEDICAL RECORD NO.:  [id number]          PATIENT TYPE:  INP   LOCATION:  2928                         FACILITY:  [facility]   PHYSICIAN:  [name]. [name], M.D.  DATE OF BIRTH:  [birth year]   DATE OF ADMISSION:  10/15/2004  DATE OF DISCHARGE:  10/17/2004                                 DISCHARGE SUMMARY   She is allergic to ACE INHIBITORS.   DISCHARGE DISPOSITION:  1.  Admitted to [facility], third degree heart block on      electrocardiogram.  2.  Weakness and shortness of breath, sudden onset, for seven days prior to      this admission.  3.  Discharging day #1, status post implantation of Medtronic SIGMA DDD      pacemaker for complete heart block, [name]. [name], M.D.  4.  2-D echocardiogram September 25, ejection fraction 75%, no wall motion      abnormalities, mild aortic regurgitation, aortic root normal size, left      atrium mild to moderately dilated, moderate to severe tricuspid      regurgitation.  Troponin I studies are negative to rule out acute      myocardial infarction.  5.  Elevated liver function tests studies, alkaline phosphatase 85, SGOT 89,      SGPT 142.  6.  Hepatitis serology for hepatitis B and C are negative.   SECONDARY DIAGNOSES:  1.  Hypertension.  2.  Admission for anemia May 2006, hemoglobin of 8, status post two units of      packed red blood cells.  3.  Status post right total hip arthroplasty April 2004.  4.  Degenerative joint disease.  5.  Status post cholecystectomy/hemorrhoidectomy.  6.  Osteoporosis.  7.  Anxiety.  8.  Obstructive sleep apnea, quit continuous positive airway pressure in      2003.  9.  Seasonal allergies.   PROCEDURES:  October 16, 2004, implantation of Medtronic DDD dual-chamber  pacemaker without complications by [name]. [name], M.D.  The chest x-ray  was inspected.  The leads were in appropriate position without pneumothorax.  The device was  interrogated on postprocedure day #1.  The patient has had no  episodes and no changes were made.  The patient's incision was healing well  without hematoma.  The patient is in a regular sinus rhythm.   The patient discharges on a low-sodium, low-cholesterol diet.  She is asked  to keep her incision dry for the next seven days and sponge-bathe until  Tuesday, October 3.  Takes Tylenol 325 mg one to tablets every four to six  hours.  She is asked not to drive for the next week and not to lift any  heavy weights for two weeks.   DISCHARGE MEDICATIONS:  1.  Os-Cal 500 mg daily.  2.  Xanax 0.5 mg daily.  3.  Hydrochlorothiazide 25 mg daily.  4.  Enteric-coated aspirin 81 mg daily.  5.  Micardis 80 mg daily.  6.  Potassium chloride 20 mEq twice daily.   Medications  that have been discontinued this admission are:  1.  Tenex, which she had been taking 1 mg daily.  2.  Sular 40 mg daily.   BRIEF HISTORY:  [name] is an 77 year old female.  She is a patient of  Dr. Loleta Chance.  She had seen Dr. Loleta Chance on Monday, September 18, for a physical  examination which was without incident.  Then on Tuesday, September 19, she  developed weakness such that going from room to room wore her completely  out.  She also got short of breath with any exertion.  Through the week her  symptoms seemed to worsen.  She re-presented to Dr. Loleta Chance on September 25.  Electrocardiogram showed third degree heart block.  He knew I wouldn't come  into the office unless something was really wrong.  The patient was  transferred to [facility] for further assessment.   HOSPITAL COURSE:  The patient presented to [facility] from Dr.  Adaline Sill office on September 25.  She was transferred for permanent pacemaker  implantation the same day, arriving here on September 25 in stable,  satisfactory condition, hemodynamically stable, oxygen saturation 97% on  room air.  She was in third degree heart block with heart rates in  the high  30s.  At rest she did not have any particular dyspnea or chest pain.  She  does not have palpitations.  She has had no history of syncope.  A 2-D  echocardiogram was obtained this admission.  The readings dictated above.  She also had elevated liver function studies and hepatitis serologies for B  and C were negative.  The patient underwent implantation of a permanent  pacemaker on September 26.  This was a dual-chamber Medtronic device and was  implanted without difficulty.  The patient discharging postprocedure day #1.  She will have the medications dictated above.  She has follow-up with the  pacer clinic Wednesday, October 31, 2004, at 9 o'clock.  She sees Dr. [name]  Tuesday, February 05, 2005, at 10:40, and she will have follow-up with Dr.  Loleta Chance for evaluation of liver function studies.      [name], P.A.    ______________________________  [name]. [name], M.D.    GM/MEDQ  D:  10/17/2004  T:  10/18/2004  Job:  [id number]   cc:   Annia Friendly. Loleta Chance, MD  Fax: [phone]   Osvaldo Shipper. Spruill, M.D.  Fax: [phone]

## 2010-06-08 NOTE — H&P (Signed)
[name], [name]                            ACCOUNT NO.:  [id number]   MEDICAL RECORD NO.:  [id number]                   PATIENT TYPE:  INP   LOCATION:  NA                                   FACILITY:  [facility]   PHYSICIAN:  Georges Lynch. Gioffre, M.D.             DATE OF BIRTH:  Jul 14, 1920   DATE OF ADMISSION:  DATE OF DISCHARGE:                                HISTORY & PHYSICAL   HISTORY OF PRESENT ILLNESS:  This patient has had right hip pain for the  past six to eight months.  She has had increasing difficulty ambulating.  She ambulate with a walker, but has severe pain with ambulation.  She has  degenerative changes of her right hip which have progressed, and she has  elected to proceed with a right total hip arthroplasty.   ALLERGIES:  No known drug allergies.   PRIMARY CARE PHYSICIAN:  Dr. Loleta Chance in [place].  The patient received  cardiac clearance from Dr. Shana Chute.   CURRENT MEDICATIONS:  1. Darvocet-N 100 one q.4-6h. p.r.n. pain.  2. Potassium 20 mEq one tablet b.i.d.  3. Xanax 0.5 mg b.i.d.  4. Sular 30 mg daily.  5. Lozol 1.25 mg daily.  6. Tenax 1 mg daily.   PAST MEDICAL HISTORY:  1. Anxiety.  2. Hemorrhoids.  3. Arthritis.  4. Hypertension.  5. Anemia.   PAST SURGICAL HISTORY:  1. The patient had hemorrhoid surgery in 1953.  2. Gallbladder removed in 1999.   FAMILY HISTORY:  The patient's father had cancer, unsure of what type.   SOCIAL HISTORY:  The patient is widowed.  She is a retired Tree surgeon.  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  Daughter will be primary caregiver following surgery.  She lives alone in a three bedroom house, two steps at the entrance, a  single story dwelling.   REVIEW OF SYMPTOMS:  GENERAL:  Denies weight change, fever, chills, fatigue.  HEENT:  Denies headache, visual changes, tinnitus, hearing loss, or sore  throat.  CARDIOVASCULAR:  Denies chest pain, palpitations, shortness of  breath, orthopnea.  PULMONARY:  Denies dyspnea, wheezing,  cough, sputum  production, or hemoptysis.  GASTROINTESTINAL:  Denies dysphagia, nausea,  vomiting, hematemesis, or abdominal pain.  GENITOURINARY:  Denies dysuria,  frequency, urgency, nocturia, or hematuria.  ENDOCRINE:  Denies polyuria,  polydipsia, appetite change, heat or cold intolerance.  NEUROLOGIC:  Denies  vertigo, dizziness, syncope, seizures.  SKIN:  Denies rash, itching, masses,  or moles.   PHYSICAL EXAMINATION:  GENERAL:  A 77 year old black female in no acute  distress.  HEENT:  Pupils equal, round, reactive to light.  Extraocular  movements were intact.  Pharynx clear.  Tympanic membranes are intact.  CHEST:  Clear to auscultation bilaterally.  No wheezes, rhonchi, or rales  noted.  HEART:  Regular rate and rhythm without murmurs, rubs, or gallops.  ABDOMEN:  Positive bowel sounds, soft, nontender, no  organomegaly or  abnormal masses.  EXTREMITIES:  Decreased range of motion of the right hip.  She has about 1/4 inch leg length discrepancy with right leg being shorter  then the left.  SKIN:  Warm and dry.   LABORATORY DATA:  X-ray of her right hip shows complete loss of the joint  space in the right hip.   IMPRESSION:  Degenerative arthritis, right hip.   PLAN:  The patient is to be admitted to [facility] on 04/27/02, to  undergo a right total hip arthroplasty.       Ebbie Ridge. Paitsel, P.A.                     Ronald A. Darrelyn Hillock, M.D.    ZOX/WRUE  D:  04/23/2002  T:  04/23/2002  Job:  [id number]

## 2010-06-08 NOTE — Discharge Summary (Signed)
[name], [name]                            ACCOUNT NO.:  [id number]   MEDICAL RECORD NO.:  [id number]                   PATIENT TYPE:  INP   LOCATION:  0466                                 FACILITY:  [facility]   PHYSICIAN:  Georges Lynch. Darrelyn Hillock, M.D.             DATE OF BIRTH:  05-22-1920   DATE OF ADMISSION:  04/27/2002  DATE OF DISCHARGE:  05/03/2002                                 DISCHARGE SUMMARY   ADMISSION DIAGNOSES:  1. Degenerative arthritis right hip.  2. Anxiety.  3. Hemorrhoids.  4. Arthritis.  5. Hypertension.  6. Anemia.   DISCHARGE DIAGNOSES:  1. Osteoarthritis right hip status post right total hip arthroplasty.  2. Anxiety.  3. Hemorrhoids.  4. Hypertension.  5. Anemia.   PROCEDURE:  The patient was taken to the operating room April 27, 2002 to  undergo a right total hip arthroplasty.  Surgeon Georges Lynch. Darrelyn Hillock, M.D.  Assistant Marlowe Kays, M.D., Ebbie Ridge. Paitsel, P.A.  Surgery was  performed under general anesthesia.   CONSULTS:  Physical therapy, occupational therapy.   BRIEF HISTORY:  This patient is a 77 year old female who has had right hip  pain for the past six to eight months.  She is having increasing difficulty  ambulating.  She is able to ambulate with a walker but due to the severity  of her pain with ambulation she does not ambulate very much.  She is  starting to become bed ridden.  The patient has severe degenerative changes  in her right hip which have progressed.  She has elected to proceed with a  right total hip arthroplasty.   LABORATORY DATA:  Pre admission CBC:  WBC 5.7, hemoglobin 10.6, hematocrit  33.5, platelet count 255,000.  Pre admission chemistry completely normal.  Pre admission urinalysis revealed moderate leukocyte esterase, wbc's 7-10  per high powered field, many bacteria.  The patient's blood type O+.  Negative antibody screen.  The patient's hemoglobin and hematocrit were  followed throughout her hospitalization.  She  had a decrease in her  hemoglobin on postoperative day one requiring 2 units of blood.  The patient  stabilized with a hemoglobin of 10.1.  Pre admission EKG revealed normal  sinus rhythm at a rate of 78.  Pre admission x-ray of right hip:  Moderate  right hip osteoarthritis.  Pre admission chest x-ray revealed a large  retrocardiac soft tissue density, most likely a hiatal hernia.  Postoperative x-ray right hip revealed satisfactory right total hip  replacement.   HOSPITAL COURSE:  The patient was admitted to [facility] and taken  to the operating room.  She underwent a right total hip arthroplasty without  complications.  The patient tolerated the procedure well.  Was allowed to  return to the recovery room and then to the orthopedic floor to continue her  postoperative care.  The patient was placed on PC analgesia for pain  control  following surgery.  The PCA was kept until postoperative day number three  when she was weaned over to oral analgesics.  The patient's hemoglobin and  hematocrit were followed throughout her hospitalization.  She did have a  drop in her hemoglobin on postoperative day two and she required 2 units of  blood.  Her hemoglobin did stabilize.  No further blood transfusions were  needed.  Physical therapy was consulted for gait training.  The patient was  very slow to progress and it was felt that she could benefit from inpatient  rehabilitation therapy prior to discharge home.  The patient was transferred  over to [facility] on May 03, 2002 on postoperative  day number six.   DISPOSITION:  Transferred to [facility] May 03, 2002.   DISCHARGE MEDICATIONS:  1. The patient is to continue all home medications.  2. Percocet 10/650 one to two q.4-6h. as needed for pain.  3. Robaxin 500 mg one q.6h. as needed for muscle spasm.   DIET:  As tolerated.   ACTIVITY:  Hip precautions.  Touchdown weightbearing with  walker.   FOLLOW UP:  The patient is to follow up with Windy Fast A. Gioffre, M.D. two  weeks from the date of surgery.  She will call to schedule the appointment.   CONDITION ON DISCHARGE:  Improved.     Ebbie Ridge. Paitsel, P.A.                     Ronald A. Darrelyn Hillock, M.D.    Tilden Dome  D:  05/12/2002  T:  05/12/2002  Job:  [id number]

## 2010-06-08 NOTE — H&P (Signed)
[name], [name] NO.:  [id number]   MEDICAL RECORD NO.:  [id number]          PATIENT TYPE:  INP   LOCATION:  2928                         FACILITY:  [facility]   PHYSICIAN:  Poynor Bing, M.D. [facility] OF BIRTH:  [birth year]   DATE OF ADMISSION:  10/15/2004  DATE OF DISCHARGE:                                HISTORY & PHYSICAL   CARDIOLOGY ADMISSION NOTE.   PRIMARY CARE PHYSICIAN:  Annia Friendly. Loleta Chance, MD.   PRIMARY CARDIOLOGIST:  Osvaldo Shipper. Spruill, M.D.-past   HISTORY OF PRESENT ILLNESS:  An 77 year old woman with 1 week history of  fatigue and found to have third-degree AV block. [name] has enjoyed  generally good health.. She does have a history of hypertension and a heart  murmur. She has been told of congestive heart failure in the past, but it is  unclear whether she has actually suffered pulmonary edema. There is a  question as to whether she has undergone coronary angiography previously.  She was referred to Dr. Shana Chute, some years ago, for evaluation of a murmur  and apparently not found to have significant valvular disease. She has done  well until the past week when she noted extreme fatigue and lassitude and  exercise intolerance. She has had dyspnea on exertion, but no chest  discomfort, lightheadedness, nor syncope.   PAST MEDICAL HISTORY:  Otherwise notable for DJD, prior right total hip  replacement, prior laparoscopic cholecystectomy and hemorrhoid repair. She  was evaluated for anemia requiring a transfusion in May 2006, but apparently  no etiology was identified.   CURRENT MEDICATIONS:  1.  A calcium supplement.  2.  Low-dose aspirin.  3.  Sular 40 mg daily.  4.  Xanax 0.5 mg daily.  5.  HCTZ 25 mg daily.  6.  KCl 20 mEq b.i.d.  7.  Tenex 1 mg daily.  8.  Micardis 8 mg daily.   ALLERGIES:  Angioedema during treatment with an ACE INHIBITOR.   SOCIAL HISTORY:  Widowed with seven children; resides alone in [place].  Occupation:  Retired Tree surgeon. No use of alcohol nor tobacco products.   FAMILY HISTORY:  Negative for coronary disease.   REVIEW OF SYSTEMS:  Occasional diaphoresis; frequent nasal discharge and  congestion; dependent edema; arthralgias of the back, shoulders and knees.  All other systems reviewed and are negative.   PHYSICAL EXAMINATION:  GENERAL:  On exam, a pleasant woman in no acute  distress.  VITAL SIGNS:  The temperature is 97, heart rate 40 and regular, respirations  20, blood pressure 120/60, weight 201.6.  HEENT:  Mild circumorbital edema; otherwise normal lids and conjunctivae.  Pupils small, round, and react to light. NECK:  No jugular venous  distension; normal carotid upstrokes without bruits.  ENDOCRINE:  No thyromegaly.  SKIN:  No significant lesions.  HEMATOPOIETIC:  No adenopathy.  LUNGS:  Decreased breath sounds at the bases; otherwise clear.  CARDIAC:  Normal PMI; distant first and second heart sounds; grade 2/6  systolic ejection murmur.  ABDOMEN:  Soft and nontender; normal bowel sounds; no bruits; no masses; no  organomegaly.  EXTREMITIES:  Trace edema; distal pulses intact.  NEUROMUSCULAR:  Symmetric strength and tone; normal cranial nerves.  MUSCULOSKELETAL:  No joint deformities.   EKG:  Normal sinus rhythm; PACs; third-degree AV block; underlying  junctional rhythm with right bundle branch block; minor nonspecific T-wave  abnormality.   OTHER LABORATORY:  Notable for normal renal function, a hemoglobin of 12.1,  mildly elevated SGOT and PT, albumin of 3.0.   IMPRESSION:  Ms. Langsam presents with the development of third-degree AV  block, likely occurring within the past week. She is taking no medications  that might have contributed to this problem. She has no history to suggest  an acute cardiac event. She will require implantation of a permanent dual-  chamber pacemaker. She has been transported from [facility], where  she was initially admitted, for  this procedure. An echocardiogram will be  obtained due to the presence of a systolic murmur. A chest x-ray is pending.  She does have mild jugular venous distension and may have a component of  congestive heart failure, which would likely be caused by her  bradyarrhythmia.      [place]mount Bing, M.D. [facility]  Electronically Signed     RR/MEDQ  D:  10/15/2004  T:  10/16/2004  Job:  [id number]

## 2010-06-08 NOTE — H&P (Signed)
NAMEDEBROH, SIELOFF                  ACCOUNT NO.:  [id number]   MEDICAL RECORD NO.:  [id number]           PATIENT TYPE:  INP   LOCATION:  A312                          FACILITY:  [facility]   PHYSICIAN:  Annia Friendly. 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  The patient is admitted for work up of  recurrent anemia.  Her history is positive for complaint of fatigue, feeling  of being cold, and lack of energy. She denies stomach pain, hematemesis,  nausea, vomiting, melena, syncope or shortness of breath at rest.   Hemoglobin collected in the office on Jun 05, 2004 demonstrated a value of  8.0 and hematocrit 25.9. The patient had a colonoscopy in October, 2001  revealing a sigmoid polyp. EGD on November 07, 1999 demonstrated a gastric  ulcer.   PAST MEDICAL HISTORY:  1.  Hypertension.  2.  Osteoarthritis.  3.  Degenerative joint disease.  4.  Chronic anxiety.  5.  History is negative for tuberculosis, cancer, asthma or seizure      disorder.   MEDICATIONS ON ADMISSION:  1.  Aciphex 20 mg p.o. daily.  2.  Xanax 0.5 mg p.o. daily.  3.  Klor-Con 20 mEq p.o. b.i.d.  4.  Os-Cal & D 500 mg p.o. b.i.d.  5.  Tenex 1 mg p.o. h.s.  6.  Sular 40 mg p.o. daily.  7.  Hydrochlorothiazide 25 mg p.o. daily.  8.  Aspirin 81 mg p.o. daily.   ALLERGIES:  ACE INHIBITOR (cough), denies edema of throat or face.   HABITS:  Negative for tobacco, ethanol and street drugs.   The patient is status post admission for a stroke greater than 28 years ago  at [facility]; hemorrhoid surgery; right total hip replacement in  April, 2004 due to advanced osteoarthritis. The patient also has had a  cholecystectomy.   FAMILY HISTORY:   Mother deceased, cause unknown; father deceased at age 33  with history of stomach cancer; a brother deceased at age 60, cause unknown;  three living sons, health unknown.  One in his 67's with history of diabetes  mellitus; four daughters living, ages range from  40 to 48, health unknown.   REVIEW OF SYSTEMS:  Positive for nasal congestion, itchy eyes, episodic  runny nose. Stiffness of knees after sitting for long periods of time.  Occasional constipation, etc. Review of systems is negative for blurred  vision, double vision, dizziness, hemoptysis, chronic cough, chest pain,  palpitations, dysuria, gross hematuria, edema of legs, weight loss, night  sweats, etc.   PHYSICAL EXAMINATION:  VITAL SIGNS:  In the office temperature was 96.0,  pulse 72, respirations 20, blood pressure 126/62, O2 saturation 97% on room  air.  GENERAL:  This is an alert, elderly black female, slightly  short and of  medium frame, who is no apparent respiratory distress.  HEENT:  Head normocephalic. Ears - external canals patent, tympanic  membranes pearly gray. Eyes - lids negative ptosis, sclerae white.  Extraocular movements are intact. Nose is negative for discharge. Mouth -  fair dentition, no bleeding gums. Posterior pharynx positive bilateral  enlarged tonsils without erythema or exudate.  NECK:  Negative for lymphadenopathy or thyromegaly. No bruits on  auscultation of carotids. Supraclavicular space without palpable nodes.  LUNGS:  Clear to auscultation.  HEART:  Audible S1 and S2 without murmurs, rubs, or gallops. Regular rate  and rhythm.  BREASTS:  No skin changes, no nipple discharge.  ABDOMEN:  Obese, hypoactive bowel sounds, soft, nontender in all 4  quadrants. No palpable masses. No organomegaly.  PELVIC:  Deferred.  RECTAL:  No external lesions on digital exam, positive stool in rectal  vault, stool guaiac negative.  EXTREMITIES: No pitting edema. Knees positive for some crepitance   bilaterally.  RIGHT HIP:  Positive for old surgical scar.  NEUROLOGIC:  Alert and oriented to person, place and time. Cranial nerves II-  XII appeared intact.   LABORATORY DATA:  On Jun 05, 2004 are as follows: White count 6.3 thousand,  hemoglobin 8.0, hematocrit 25.9, MCV 70, MCH 22, MCHC 31, platelets 362,000.  Glucose 105, BUN 11, creatinine 0.9, sodium 140, potassium 3.8, chloride  102, CO2 23, calcium 9.4.   IMPRESSION:  1.  Recurrent anemia.  2.  Hypertension.  3.  Osteoarthritis.  4.  Chronic anxiety.   PLAN:  Admit for diagnostic esophagogastroduodenoscopy and colonoscopy.  Retic count and anemia panel. Consult by Dr. Maggie Schwalbe to perform these  procedures. Intravenous normal saline at 30 cc/Hr.  Diet is 4 gram sodium,  low cholesterol. Fasting lipid profile.   MEDICATIONS:  1.  Alprazolam 0.5 mg p.o. daily.  2.  Allegra 180 mg p.o. daily.  3.  Aciphex 20 mg p.o. daily.  4.  Klor-Con 20 mEq p.o. b.i.d.  5.  Os-Cal & D 500 mg p.o. b.i.d.  6.  Tenex 1 mg p.o. h.s.  7.  Hydrochlorothiazide 25 mg p.o. daily.  8.  Aspirin 81 mg p.o. daily.  9.  Sular 40 mg p.o. daily.   ACTIVITY:  As tolerated.      GKH/MEDQ  D:  06/08/2004  T:  06/08/2004  Job:  [id number]

## 2010-06-08 NOTE — Discharge Summary (Signed)
[name], [name]                            ACCOUNT NO.:  [id number]   MEDICAL RECORD NO.:  [id number]                   PATIENT TYPE:  IPS   LOCATION:  4148                                 FACILITY:  [facility]   PHYSICIAN:  Ranelle Oyster, M.D.             DATE OF BIRTH:  20-Jul-1920   DATE OF ADMISSION:  05/03/2002  DATE OF DISCHARGE:  05/10/2002                                 DISCHARGE SUMMARY   DISCHARGE DIAGNOSES:  1. Right total hip replacement, April 27, 2002, secondary to advanced     osteoarthritis.  2. Anemia.  3. Pain management.  4. Coumadin for deep venous thrombosis prophylaxis.  5. Hypertension.  6. Anxiety.  7. History of hemorrhoids.   HISTORY OF PRESENT ILLNESS:  [age]-year-old black female admitted to  [facility], April 27, 2002, with chronic progressive right hip  pain, six to eight months, x-rays with advanced osteoarthritis, no relief  with conservative care.  Underwent a right total hip arthroplasty, April 27, 2002, per Dr. Georges Lynch. Gioffre, placed on Coumadin for deep venous  thrombosis prophylaxis, partial weightbearing with hip precautions.  Postoperative anemia, 8.2, and transfused.  Hypokalemia of 2.8 and  supplement added.  Pain management with OxyContin continued-release and  tapered.  Moderate assist for transfers.  Admitted for a comprehensive rehab  program.   PAST MEDICAL HISTORY:  See discharge diagnoses.   PAST SURGICAL HISTORY:  Hemorrhoid surgery and cholecystectomy.   ALLERGIES:  None.   SOCIAL HISTORY:  Denies alcohol or tobacco.   PRIMARY MEDICAL DOCTOR:  Her primary M.D. is Dr. Annia Friendly. [place].   MEDICATIONS PRIOR TO ADMISSION:  Darvocet, potassium chloride, Xanax, Sular,  Lozol and Tenex.   SOCIAL HISTORY:  Widowed, retired Tree surgeon, independent prior to  admission.  One-level home, two steps to entry.  Seven children; daughter  will be the main caregiver at discharge.   HOSPITAL COURSE:   The patient did well while in rehabilitation services,  with therapies initiated on a twice-daily basis.  The following issues were  followed during patient's rehab course:  Pertaining to the patient's right  total hip replacement, surgical site healing nicely, staples have been  removed, no signs of infection.  She was partial weightbearing with hip  precautions.  The patient would follow up with Dr. Worthy Rancher of orthopedic  services on discharge.  Postoperative anemia stable with latest hemoglobin  of 10.1 and hematocrit 30.9.  She continued on iron supplement.  Pain  management ongoing with the use of OxyContin continued-release, which was  tapered; she was also using Tylox for breakthrough pain.  She remained on  Coumadin for deep venous thrombosis prophylaxis; this was to be continued  until May 27, 2002 and discontinued.  She would be followed by [facility].  Blood pressure remained controlled on home doses of Tenex,  Lozol and Sular.  She would follow up with her primary M.D., Dr. Mirna Mires.  She had no bowel or bladder disturbances during her rehab course.  She was using Xanax at nighttime for a history of anxiety.  She was now  independent in her room with her functional mobility, essentially  independent to standby assist in all areas of activities of daily living of  dressing, grooming and homemaking.  Home health therapies were to be  arranged.   Latest labs showed an INR of 2.7, hemoglobin 10.1, hematocrit 30.9, sodium  136, BUN 8, creatinine 0.7.   DISCHARGE MEDICATIONS:  1. Coumadin 5 mg one-half tablet daily until May 27, 2002 and stop.  2. Tenex 1 mg daily.  3. Lozol 1.25 mg daily.  4. Sular 30 mg daily.  5. OxyContin continued-release 10 mg one tablet every 12 hours x1 week.  6. Trinsicon twice daily.  7. Xanax 0.5 mg twice daily as needed.  8. Tylox as needed -- pain.   ACTIVITY:  Partial weightbearing with hip precautions.   DIET:  Diet  was regular.   WOUND CARE:  Cleanse incision daily, warm water and soap.   SPECIAL INSTRUCTIONS:  Home health nurse to check INR on Wednesday, May 12, 2002, per [facility].   FOLLOWUP:  The patient should follow up with Dr. Worthy Rancher, [phone],  orthopedic services, and Dr. Mirna Mires, medical management.     Mariam Dollar, P.A.                     Ranelle Oyster, M.D.    DA/MEDQ  D:  05/10/2002  T:  05/11/2002  Job:  [id number]   cc:   Georges Lynch. Darrelyn Hillock, M.D.  [address]  [place]  [place] [address]  Fax: [phone]   Annia Friendly. Loleta Chance, M.D.  [address]  [place]  [place] [address]  Fax: [phone]

## 2010-07-03 ENCOUNTER — Encounter: Payer: Self-pay | Admitting: Internal Medicine

## 2010-07-03 DIAGNOSIS — I442 Atrioventricular block, complete: Secondary | ICD-10-CM

## 2010-09-04 ENCOUNTER — Other Ambulatory Visit: Payer: Self-pay | Admitting: Internal Medicine

## 2010-09-05 ENCOUNTER — Other Ambulatory Visit: Payer: Self-pay | Admitting: Internal Medicine

## 2010-09-07 ENCOUNTER — Other Ambulatory Visit: Payer: Self-pay | Admitting: *Deleted

## 2010-09-07 MED ORDER — ATENOLOL 25 MG PO TABS: 25.0000 mg | ORAL_TABLET | Freq: Two times a day (BID) | ORAL | Status: DC

## 2010-09-25 ENCOUNTER — Other Ambulatory Visit: Payer: Self-pay | Admitting: Internal Medicine

## 2010-09-26 ENCOUNTER — Other Ambulatory Visit: Payer: Self-pay | Admitting: Internal Medicine

## 2010-10-02 ENCOUNTER — Encounter: Payer: Self-pay | Admitting: Internal Medicine

## 2010-10-02 DIAGNOSIS — I442 Atrioventricular block, complete: Secondary | ICD-10-CM

## 2010-10-26 ENCOUNTER — Encounter: Payer: Self-pay | Admitting: Internal Medicine

## 2010-10-28 ENCOUNTER — Other Ambulatory Visit: Payer: Self-pay | Admitting: Internal Medicine

## 2010-10-31 LAB — URINALYSIS, ROUTINE W REFLEX MICROSCOPIC
Glucose, UA: NEGATIVE
Nitrite: NEGATIVE
Protein, ur: 30 — AB
Urobilinogen, UA: 0.2
pH: 6

## 2010-10-31 LAB — DIFFERENTIAL
Basophils Absolute: 0
Basophils Relative: 0
Eosinophils Absolute: 0.1
Lymphocytes Relative: 21
Monocytes Absolute: 0.5
Monocytes Relative: 8

## 2010-10-31 LAB — COMPREHENSIVE METABOLIC PANEL
ALT: 15
BUN: 8
Glucose, Bld: 92
Potassium: 3.6
Sodium: 140

## 2010-10-31 LAB — CBC
Hemoglobin: 14.4
MCV: 94.5
Platelets: 188
WBC: 6.3

## 2010-10-31 LAB — URINE MICROSCOPIC-ADD ON

## 2010-11-01 ENCOUNTER — Inpatient Hospital Stay (HOSPITAL_BASED_OUTPATIENT_CLINIC_OR_DEPARTMENT_OTHER)
Admission: RE | Admit: 2010-11-01 | Discharge: 2010-11-01 | Disposition: A | Discharge status: Home / Self Care | Payer: Medicare Other | Source: Ambulatory Visit | Attending: Cardiology | Admitting: Cardiology

## 2010-11-01 DIAGNOSIS — R0609 Other forms of dyspnea: Secondary | ICD-10-CM | POA: Insufficient documentation

## 2010-11-01 DIAGNOSIS — R0989 Other specified symptoms and signs involving the circulatory and respiratory systems: Secondary | ICD-10-CM | POA: Insufficient documentation

## 2010-11-01 DIAGNOSIS — I359 Nonrheumatic aortic valve disorder, unspecified: Secondary | ICD-10-CM | POA: Insufficient documentation

## 2010-11-01 DIAGNOSIS — E7800 Pure hypercholesterolemia, unspecified: Secondary | ICD-10-CM | POA: Insufficient documentation

## 2010-11-01 DIAGNOSIS — I10 Essential (primary) hypertension: Secondary | ICD-10-CM | POA: Insufficient documentation

## 2010-11-01 DIAGNOSIS — R5381 Other malaise: Secondary | ICD-10-CM | POA: Insufficient documentation

## 2010-11-01 DIAGNOSIS — M1990 Unspecified osteoarthritis, unspecified site: Secondary | ICD-10-CM | POA: Insufficient documentation

## 2010-11-01 DIAGNOSIS — I2510 Atherosclerotic heart disease of native coronary artery without angina pectoris: Secondary | ICD-10-CM | POA: Insufficient documentation

## 2010-11-01 DIAGNOSIS — I252 Old myocardial infarction: Secondary | ICD-10-CM | POA: Insufficient documentation

## 2010-11-15 NOTE — Op Note (Signed)
[name], [name]                  ACCOUNT NO.:  [id number]  MEDICAL RECORD NO.:  [id number]  LOCATION:                                 FACILITY:  PHYSICIAN:  Cherie Lasalle N. Sharyn Lull, M.D. DATE OF BIRTH:  1920/02/07  DATE OF PROCEDURE:  11/01/2010 DATE OF DISCHARGE:                              OPERATIVE REPORT   PROCEDURE:  Left cardiac catheterization with selective left and right coronary angiography, left ventriculography via right groin using Judkins technique.  INDICATION FOR THE PROCEDURE:  [name] is 77 year old black female with past medical history significant for coronary artery disease, history of CHF in the past probably secondary to diastolic dysfunction, hypertension, hypercholesteremia, history of MI, degenerative joint disease complains of exertional dyspnea with minimal exertion associated with feeling weak and tired.  Denies any chest pain, nausea, vomiting, diaphoresis.  Denies PND, orthopnea, or leg swelling.  Deniespalpitation, lightheadedness, or syncope.  Denies any cough, fever, or chills.  Denies any recent cardiac workup.  PAST MEDICAL HISTORY:  As above.  PAST SURGICAL HISTORY:  She had right hip replacement in the past, had cholecystectomy in the past, had chemo iridectomy in the past, had permanent pacemaker placed in the past.  ALLERGIES:  No known drug allergies.  MEDICATIONS:  At home, she is on aspirin, hydrochlorothiazide, atenolol, lisinopril, amlodipine, and simvastatin.  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  No history of alcohol abuse or tobacco abuse.  PHYSICAL EXAMINATION:  GENERAL:  She was alert, awake, oriented x3 in no acute distress. VITAL SIGNS:  Blood pressure was 140/80, pulse was 67 and regular. EYES:  Conjunctivae was pink. NECK:  Supple.  No JVD.  No bruit. LUNGS:  Clear to auscultation without rhonchi or rales. CARDIOVASCULAR:  S1, S2 was normal.  There was  2/6 systolic murmur. ABDOMEN:  Soft, bowel sounds present, nontender. EXTREMITIES:  No clubbing, cyanosis, or edema.  IMPRESSION:  Exertional dyspnea, weakness probably angina equivalent, rule out coronary insufficiency, hypertension, coronary artery disease, compensated heart failure, mild aortic stenosis,  probable sick sinus syndrome status post permanent pacemaker.  Discussed with the patient regarding various options of treatment, i.e., noninvasive stress testing versus left cath, its risks and benefits, i.e., death, MI, stroke, local vascular complications etc. and consented for left cath.  PROCEDURE:  After obtaining the informed consent, the patient was brought to the cath lab and was placed on fluoroscopy table.  Right groin was prepped and draped in usual fashion.  Xylocaine 1% was used for local anesthesia in the right groin.  With the help of thin wall needle, a 4-French arterial sheath was placed.  The sheath was aspirated and flushed.  Next, 4-French left Judkins catheter was advanced over the wire under fluoroscopic guidance up to the ascending aorta.  Wire was pulled out, the catheter was aspirated and connected to the Manifold. Catheter was further advanced and engaged into left coronary ostium. Multiple views of the left system were taken.  Next, the catheter was disengaged and was pulled out over the wire and was replaced with 4- [place] right Judkins catheter, which was advanced over  the wire under fluoroscopic guidance up to the ascending aorta.  Wire was pulled out, the catheter was aspirated and connected to the Manifold.  Catheter was further advanced and engaged into right coronary ostium.  Multiple views of the right system were taken.  Next, catheter was disengaged and was pulled out over the wire and was replaced with 4-French pigtail catheter which was advanced over the wire under fluoroscopic guidance up to the ascending aorta.  Wire was pulled out, the  catheter was aspirated and connected to the Manifold.  Catheter was further advanced across the aortic valve into the LV.  LV pressures were recorded.  Next, LV graphy was done in 30-degree RAO position.  Post angiographic pressures were recorded from LV and then pullback pressures were recorded from the aorta.  There was no significant gradient across the aortic valve. Next, pigtail catheter was pulled out over the wire, sheaths were aspirated and flushed.  FINDINGS:  LV showed good LV systolic function, EF of 65-70%.  Left main was patent.  LAD has 20-25% proximal and 65-70% mid focal stenosis and 30-40% distal stenosis.  The LAD vessel is calcified.  High diagonal 1 was moderate size which has 20-30% mid stenosis.  Diagonal 2 and 3 were very small.  Left circumflex has 40-50% mid stenosis.  High OM 1 is moderate size which has mild disease.  OM 2 to OM 4 were very small.  RCA has 15-20% mid stenosis.  PDA and PLV branches were small which were patent.  The patient tolerated the procedure well.  There were no complications.  The patient was transferred to recovery room in stable condition.     [name]. Sharyn Lull, M.D. MNH/MEDQ  D:  11/01/2010  T:  11/01/2010  Job:  [id number]  Electronically Signed by Rinaldo Cloud M.D. on 11/15/2010 10:03:56 AM

## 2011-01-01 ENCOUNTER — Encounter: Payer: Self-pay | Admitting: Internal Medicine

## 2011-01-01 DIAGNOSIS — I442 Atrioventricular block, complete: Secondary | ICD-10-CM

## 2011-01-07 ENCOUNTER — Other Ambulatory Visit: Payer: Self-pay | Admitting: Internal Medicine

## 2011-01-31 DIAGNOSIS — I2510 Atherosclerotic heart disease of native coronary artery without angina pectoris: Secondary | ICD-10-CM | POA: Diagnosis not present

## 2011-01-31 DIAGNOSIS — IMO0002 Reserved for concepts with insufficient information to code with codable children: Secondary | ICD-10-CM | POA: Diagnosis not present

## 2011-01-31 DIAGNOSIS — Z959 Presence of cardiac and vascular implant and graft, unspecified: Secondary | ICD-10-CM | POA: Diagnosis not present

## 2011-01-31 DIAGNOSIS — R269 Unspecified abnormalities of gait and mobility: Secondary | ICD-10-CM | POA: Diagnosis not present

## 2011-01-31 DIAGNOSIS — M1990 Unspecified osteoarthritis, unspecified site: Secondary | ICD-10-CM | POA: Diagnosis not present

## 2011-02-07 DIAGNOSIS — E7800 Pure hypercholesterolemia, unspecified: Secondary | ICD-10-CM | POA: Diagnosis not present

## 2011-02-07 DIAGNOSIS — I359 Nonrheumatic aortic valve disorder, unspecified: Secondary | ICD-10-CM | POA: Diagnosis not present

## 2011-02-07 DIAGNOSIS — I10 Essential (primary) hypertension: Secondary | ICD-10-CM | POA: Diagnosis not present

## 2011-02-07 DIAGNOSIS — I495 Sick sinus syndrome: Secondary | ICD-10-CM | POA: Diagnosis not present

## 2011-02-07 DIAGNOSIS — I2510 Atherosclerotic heart disease of native coronary artery without angina pectoris: Secondary | ICD-10-CM | POA: Diagnosis not present

## 2011-03-29 DIAGNOSIS — I359 Nonrheumatic aortic valve disorder, unspecified: Secondary | ICD-10-CM | POA: Diagnosis not present

## 2011-03-29 DIAGNOSIS — I495 Sick sinus syndrome: Secondary | ICD-10-CM | POA: Diagnosis not present

## 2011-03-29 DIAGNOSIS — E7800 Pure hypercholesterolemia, unspecified: Secondary | ICD-10-CM | POA: Diagnosis not present

## 2011-03-29 DIAGNOSIS — I10 Essential (primary) hypertension: Secondary | ICD-10-CM | POA: Diagnosis not present

## 2011-03-29 DIAGNOSIS — I2510 Atherosclerotic heart disease of native coronary artery without angina pectoris: Secondary | ICD-10-CM | POA: Diagnosis not present

## 2011-04-02 ENCOUNTER — Encounter: Payer: Self-pay | Admitting: Internal Medicine

## 2011-04-02 DIAGNOSIS — I442 Atrioventricular block, complete: Secondary | ICD-10-CM

## 2011-04-02 DIAGNOSIS — I495 Sick sinus syndrome: Secondary | ICD-10-CM | POA: Diagnosis not present

## 2011-04-11 ENCOUNTER — Telehealth: Payer: Self-pay | Admitting: Internal Medicine

## 2011-04-11 NOTE — Telephone Encounter (Signed)
04-11-11 Called pt to set up past due appt with [name] in [place] , pt had a mednet check done 04-02-11, per kristen will need June/July appt with [name], will send recall/mt

## 2011-04-17 ENCOUNTER — Other Ambulatory Visit: Payer: Self-pay | Admitting: Internal Medicine

## 2011-04-18 ENCOUNTER — Other Ambulatory Visit: Payer: Self-pay | Admitting: Internal Medicine

## 2011-04-18 ENCOUNTER — Other Ambulatory Visit: Payer: Self-pay

## 2011-04-18 MED ORDER — LISINOPRIL 40 MG PO TABS: 40.0000 mg | ORAL_TABLET | Freq: Every day | ORAL | Status: DC

## 2011-04-22 ENCOUNTER — Other Ambulatory Visit: Payer: Self-pay

## 2011-04-22 MED ORDER — LISINOPRIL 40 MG PO TABS: 40.0000 mg | ORAL_TABLET | Freq: Every day | ORAL | Status: DC

## 2011-05-09 DIAGNOSIS — I495 Sick sinus syndrome: Secondary | ICD-10-CM | POA: Diagnosis not present

## 2011-05-09 DIAGNOSIS — I509 Heart failure, unspecified: Secondary | ICD-10-CM | POA: Diagnosis not present

## 2011-05-09 DIAGNOSIS — I10 Essential (primary) hypertension: Secondary | ICD-10-CM | POA: Diagnosis not present

## 2011-05-09 DIAGNOSIS — E7800 Pure hypercholesterolemia, unspecified: Secondary | ICD-10-CM | POA: Diagnosis not present

## 2011-05-09 DIAGNOSIS — I2510 Atherosclerotic heart disease of native coronary artery without angina pectoris: Secondary | ICD-10-CM | POA: Diagnosis not present

## 2011-06-20 ENCOUNTER — Other Ambulatory Visit: Payer: Self-pay | Admitting: Internal Medicine

## 2011-07-02 DIAGNOSIS — I495 Sick sinus syndrome: Secondary | ICD-10-CM | POA: Diagnosis not present

## 2011-07-02 DIAGNOSIS — I442 Atrioventricular block, complete: Secondary | ICD-10-CM | POA: Diagnosis not present

## 2011-07-29 ENCOUNTER — Ambulatory Visit (INDEPENDENT_AMBULATORY_CARE_PROVIDER_SITE_OTHER): Payer: Medicare Other | Admitting: Internal Medicine

## 2011-07-29 ENCOUNTER — Encounter: Payer: Self-pay | Admitting: Internal Medicine

## 2011-07-29 VITALS — BP 159/89 | HR 81 | Ht 60.0 in | Wt 174.8 lb

## 2011-07-29 DIAGNOSIS — Z950 Presence of cardiac pacemaker: Secondary | ICD-10-CM

## 2011-07-29 DIAGNOSIS — I498 Other specified cardiac arrhythmias: Secondary | ICD-10-CM | POA: Diagnosis not present

## 2011-07-29 DIAGNOSIS — I10 Essential (primary) hypertension: Secondary | ICD-10-CM

## 2011-07-29 LAB — PACEMAKER DEVICE OBSERVATION
AL IMPEDENCE PM: 499 Ohm
AL THRESHOLD: 1 V
BAMS-0001: 175 {beats}/min
BATTERY VOLTAGE: 2.78 V
RV LEAD AMPLITUDE: 11.2 mv
RV LEAD IMPEDENCE PM: 541 Ohm
RV LEAD THRESHOLD: 1 V
VENTRICULAR PACING PM: 99.7

## 2011-07-29 NOTE — Assessment & Plan Note (Signed)
Her device is working normally. Will recheck in several months. 

## 2011-07-29 NOTE — Patient Instructions (Addendum)
Your physician recommends that you continue on your current medications as directed. Please refer to the Current Medication list given to you today.  Your physician recommends that you schedule a follow-up appointment in: 1 year  

## 2011-07-29 NOTE — Assessment & Plan Note (Signed)
Her blood pressure is elevated but she states that she took her meds just as she arrived.

## 2011-07-29 NOTE — Progress Notes (Signed)
HPI [name] returns today for followup. She is a pleasant 78 yo woman with a h/o symptomatic bradycardia, s/p PPM, obesity, HTN, and chest pain. She has been stable but does note some mild dyspnea with exertion and peripheral edema. No syncope. No Known Allergies   Current Outpatient Prescriptions  Medication Sig Dispense Refill  . acetaminophen (TYLENOL) 650 MG CR tablet Take 650 mg by mouth as needed.        Marland Kitchen amLODipine (NORVASC) 10 MG tablet TAKE 1 TABLET BY MOUTH EVERY DAY  30 tablet  2  . aspirin 81 MG tablet Take 81 mg by mouth daily.        Marland Kitchen atenolol (TENORMIN) 25 MG tablet TAKE 1 TABLET BY MOUTH TWICE DAILY  180 tablet  2  . calcium-vitamin D (OSCAL WITH D) 500-200 MG-UNIT per tablet Take 1 tablet by mouth daily.        . ferrous sulfate 325 (65 FE) MG tablet Take 325 mg by mouth daily with breakfast.        . hydrochlorothiazide (,MICROZIDE/HYDRODIURIL,) 12.5 MG capsule TAKE ONE CAPSULE BY MOUTH EVERY DAY  30 capsule  2  . lisinopril (PRINIVIL,ZESTRIL) 40 MG tablet Take 1 tablet (40 mg total) by mouth daily.  90 tablet  1  . simvastatin (ZOCOR) 20 MG tablet TAKE 1 TABLET BY MOUTH EVERY DAY  30 tablet  2  . DISCONTD: atenolol (TENORMIN) 25 MG tablet Take 1 tablet (25 mg total) by mouth 2 (two) times daily.  60 tablet  6     Past Medical History  Diagnosis Date  . DJD (degenerative joint disease)   . Osteoporosis   . Anxiety   . Sleep apnea   . Seasonal allergies   . Anemia   . Hypertension     ROS:   All systems reviewed and negative except as noted in the HPI.   Past Surgical History  Procedure Date  . Pacemaker insertion   . Hemorrhoid surgery   . Cholecystectomy   . Total hip arthroplasty      Family History  Problem Relation Age of Onset  . Stomach cancer Father      History   Social History  . Marital Status: Widowed    Spouse Name: N/A    Number of Children: N/A  . Years of Education: N/A   Occupational History  . Not on file.   Social  History Main Topics  . Smoking status: Unknown If Ever Smoked  . Smokeless tobacco: Not on file  . Alcohol Use: No  . Drug Use: No  . Sexually Active: Not on file   Other Topics Concern  . Not on file   Social History Narrative   WidowedDaughter     BP 159/89  Pulse 81  Ht 5' (1.524 m)  Wt 174 lb 12.8 oz (79.289 kg)  BMI 34.14 kg/m2  Physical Exam:  Well appearing elderly woman, NAD HEENT: Unremarkable Neck:  No JVD, no thyromegally Lungs:  Clear with no wheezes, rales, or rhonchi. HEART:  Regular rate rhythm, no murmurs, no rubs, no clicks Abd:  soft, positive bowel sounds, no organomegally, no rebound, no guarding Ext:  2 plus pulses, no edema, no cyanosis, no clubbing Skin:  No rashes no nodules Neuro:  CN II through XII intact, motor grossly intact   DEVICE  Normal device function.  See PaceArt for details.   Assess/Plan:

## 2011-07-30 DIAGNOSIS — I10 Essential (primary) hypertension: Secondary | ICD-10-CM | POA: Diagnosis not present

## 2011-07-30 DIAGNOSIS — I2510 Atherosclerotic heart disease of native coronary artery without angina pectoris: Secondary | ICD-10-CM | POA: Diagnosis not present

## 2011-07-30 DIAGNOSIS — E7800 Pure hypercholesterolemia, unspecified: Secondary | ICD-10-CM | POA: Diagnosis not present

## 2011-07-31 ENCOUNTER — Encounter: Payer: Self-pay | Admitting: Internal Medicine

## 2011-08-06 DIAGNOSIS — D649 Anemia, unspecified: Secondary | ICD-10-CM | POA: Diagnosis not present

## 2011-08-06 DIAGNOSIS — I2510 Atherosclerotic heart disease of native coronary artery without angina pectoris: Secondary | ICD-10-CM | POA: Diagnosis not present

## 2011-08-06 DIAGNOSIS — IMO0002 Reserved for concepts with insufficient information to code with codable children: Secondary | ICD-10-CM | POA: Diagnosis not present

## 2011-08-06 DIAGNOSIS — Z959 Presence of cardiac and vascular implant and graft, unspecified: Secondary | ICD-10-CM | POA: Diagnosis not present

## 2011-08-06 DIAGNOSIS — R7309 Other abnormal glucose: Secondary | ICD-10-CM | POA: Diagnosis not present

## 2011-08-08 DIAGNOSIS — I359 Nonrheumatic aortic valve disorder, unspecified: Secondary | ICD-10-CM | POA: Diagnosis not present

## 2011-08-08 DIAGNOSIS — I495 Sick sinus syndrome: Secondary | ICD-10-CM | POA: Diagnosis not present

## 2011-08-08 DIAGNOSIS — I509 Heart failure, unspecified: Secondary | ICD-10-CM | POA: Diagnosis not present

## 2011-08-08 DIAGNOSIS — I2510 Atherosclerotic heart disease of native coronary artery without angina pectoris: Secondary | ICD-10-CM | POA: Diagnosis not present

## 2011-08-08 DIAGNOSIS — I10 Essential (primary) hypertension: Secondary | ICD-10-CM | POA: Diagnosis not present

## 2011-08-08 DIAGNOSIS — E7800 Pure hypercholesterolemia, unspecified: Secondary | ICD-10-CM | POA: Diagnosis not present

## 2011-09-30 ENCOUNTER — Other Ambulatory Visit: Payer: Self-pay | Admitting: Internal Medicine

## 2011-10-01 DIAGNOSIS — I442 Atrioventricular block, complete: Secondary | ICD-10-CM | POA: Diagnosis not present

## 2011-10-01 DIAGNOSIS — I495 Sick sinus syndrome: Secondary | ICD-10-CM | POA: Diagnosis not present

## 2011-10-28 ENCOUNTER — Other Ambulatory Visit: Payer: Self-pay | Admitting: Internal Medicine

## 2011-11-05 DIAGNOSIS — I10 Essential (primary) hypertension: Secondary | ICD-10-CM | POA: Diagnosis not present

## 2011-11-05 DIAGNOSIS — Z23 Encounter for immunization: Secondary | ICD-10-CM | POA: Diagnosis not present

## 2011-11-05 DIAGNOSIS — I2510 Atherosclerotic heart disease of native coronary artery without angina pectoris: Secondary | ICD-10-CM | POA: Diagnosis not present

## 2011-11-07 DIAGNOSIS — I2510 Atherosclerotic heart disease of native coronary artery without angina pectoris: Secondary | ICD-10-CM | POA: Diagnosis not present

## 2011-11-07 DIAGNOSIS — E7800 Pure hypercholesterolemia, unspecified: Secondary | ICD-10-CM | POA: Diagnosis not present

## 2011-11-07 DIAGNOSIS — I10 Essential (primary) hypertension: Secondary | ICD-10-CM | POA: Diagnosis not present

## 2011-11-29 ENCOUNTER — Other Ambulatory Visit: Payer: Self-pay | Admitting: Internal Medicine

## 2011-12-31 ENCOUNTER — Encounter: Payer: Self-pay | Admitting: Internal Medicine

## 2011-12-31 DIAGNOSIS — I442 Atrioventricular block, complete: Secondary | ICD-10-CM

## 2011-12-31 DIAGNOSIS — I495 Sick sinus syndrome: Secondary | ICD-10-CM | POA: Diagnosis not present

## 2012-01-01 ENCOUNTER — Other Ambulatory Visit: Payer: Self-pay | Admitting: Internal Medicine

## 2012-02-04 ENCOUNTER — Other Ambulatory Visit: Payer: Self-pay | Admitting: Internal Medicine

## 2012-02-04 DIAGNOSIS — E119 Type 2 diabetes mellitus without complications: Secondary | ICD-10-CM | POA: Diagnosis not present

## 2012-02-04 DIAGNOSIS — I10 Essential (primary) hypertension: Secondary | ICD-10-CM | POA: Diagnosis not present

## 2012-02-04 DIAGNOSIS — I2510 Atherosclerotic heart disease of native coronary artery without angina pectoris: Secondary | ICD-10-CM | POA: Diagnosis not present

## 2012-02-04 DIAGNOSIS — D649 Anemia, unspecified: Secondary | ICD-10-CM | POA: Diagnosis not present

## 2012-02-06 DIAGNOSIS — I495 Sick sinus syndrome: Secondary | ICD-10-CM | POA: Diagnosis not present

## 2012-02-06 DIAGNOSIS — E7800 Pure hypercholesterolemia, unspecified: Secondary | ICD-10-CM | POA: Diagnosis not present

## 2012-02-06 DIAGNOSIS — I10 Essential (primary) hypertension: Secondary | ICD-10-CM | POA: Diagnosis not present

## 2012-02-06 DIAGNOSIS — I359 Nonrheumatic aortic valve disorder, unspecified: Secondary | ICD-10-CM | POA: Diagnosis not present

## 2012-02-07 DIAGNOSIS — I10 Essential (primary) hypertension: Secondary | ICD-10-CM | POA: Diagnosis not present

## 2012-02-07 DIAGNOSIS — D649 Anemia, unspecified: Secondary | ICD-10-CM | POA: Diagnosis not present

## 2012-02-07 DIAGNOSIS — I2510 Atherosclerotic heart disease of native coronary artery without angina pectoris: Secondary | ICD-10-CM | POA: Diagnosis not present

## 2012-02-12 ENCOUNTER — Encounter: Payer: Self-pay | Admitting: Internal Medicine

## 2012-02-12 ENCOUNTER — Ambulatory Visit (INDEPENDENT_AMBULATORY_CARE_PROVIDER_SITE_OTHER): Payer: Medicare Other | Admitting: *Deleted

## 2012-02-12 DIAGNOSIS — I495 Sick sinus syndrome: Secondary | ICD-10-CM | POA: Diagnosis not present

## 2012-02-12 DIAGNOSIS — I498 Other specified cardiac arrhythmias: Secondary | ICD-10-CM

## 2012-02-12 DIAGNOSIS — I359 Nonrheumatic aortic valve disorder, unspecified: Secondary | ICD-10-CM | POA: Diagnosis not present

## 2012-02-12 DIAGNOSIS — E7800 Pure hypercholesterolemia, unspecified: Secondary | ICD-10-CM | POA: Diagnosis not present

## 2012-02-12 DIAGNOSIS — I10 Essential (primary) hypertension: Secondary | ICD-10-CM | POA: Diagnosis not present

## 2012-02-12 LAB — PACEMAKER DEVICE OBSERVATION
AL IMPEDENCE PM: 484 Ohm
BATTERY VOLTAGE: 2.77 V
RV LEAD IMPEDENCE PM: 525 Ohm

## 2012-02-12 NOTE — Progress Notes (Signed)
PPM check 

## 2012-04-06 ENCOUNTER — Other Ambulatory Visit: Payer: Self-pay | Admitting: Internal Medicine

## 2012-04-08 ENCOUNTER — Other Ambulatory Visit: Payer: Self-pay | Admitting: *Deleted

## 2012-04-08 MED ORDER — LISINOPRIL 40 MG PO TABS: 40.0000 mg | ORAL_TABLET | Freq: Every day | ORAL | Status: DC

## 2012-05-04 DIAGNOSIS — I495 Sick sinus syndrome: Secondary | ICD-10-CM | POA: Diagnosis not present

## 2012-05-04 DIAGNOSIS — I442 Atrioventricular block, complete: Secondary | ICD-10-CM

## 2012-05-05 DIAGNOSIS — I2510 Atherosclerotic heart disease of native coronary artery without angina pectoris: Secondary | ICD-10-CM | POA: Diagnosis not present

## 2012-05-05 DIAGNOSIS — I10 Essential (primary) hypertension: Secondary | ICD-10-CM | POA: Diagnosis not present

## 2012-05-05 DIAGNOSIS — M1990 Unspecified osteoarthritis, unspecified site: Secondary | ICD-10-CM | POA: Diagnosis not present

## 2012-05-12 DIAGNOSIS — I10 Essential (primary) hypertension: Secondary | ICD-10-CM | POA: Diagnosis not present

## 2012-05-12 DIAGNOSIS — I495 Sick sinus syndrome: Secondary | ICD-10-CM | POA: Diagnosis not present

## 2012-05-12 DIAGNOSIS — E7800 Pure hypercholesterolemia, unspecified: Secondary | ICD-10-CM | POA: Diagnosis not present

## 2012-05-12 DIAGNOSIS — I509 Heart failure, unspecified: Secondary | ICD-10-CM | POA: Diagnosis not present

## 2012-08-03 DIAGNOSIS — I442 Atrioventricular block, complete: Secondary | ICD-10-CM

## 2012-08-03 DIAGNOSIS — Z950 Presence of cardiac pacemaker: Secondary | ICD-10-CM | POA: Diagnosis not present

## 2012-08-04 DIAGNOSIS — E669 Obesity, unspecified: Secondary | ICD-10-CM | POA: Diagnosis not present

## 2012-08-04 DIAGNOSIS — I2510 Atherosclerotic heart disease of native coronary artery without angina pectoris: Secondary | ICD-10-CM | POA: Diagnosis not present

## 2012-08-04 DIAGNOSIS — M1990 Unspecified osteoarthritis, unspecified site: Secondary | ICD-10-CM | POA: Diagnosis not present

## 2012-08-04 DIAGNOSIS — I10 Essential (primary) hypertension: Secondary | ICD-10-CM | POA: Diagnosis not present

## 2012-08-06 DIAGNOSIS — E669 Obesity, unspecified: Secondary | ICD-10-CM | POA: Diagnosis not present

## 2012-08-06 DIAGNOSIS — I2510 Atherosclerotic heart disease of native coronary artery without angina pectoris: Secondary | ICD-10-CM | POA: Diagnosis not present

## 2012-08-06 DIAGNOSIS — I10 Essential (primary) hypertension: Secondary | ICD-10-CM | POA: Diagnosis not present

## 2012-08-06 DIAGNOSIS — M1990 Unspecified osteoarthritis, unspecified site: Secondary | ICD-10-CM | POA: Diagnosis not present

## 2012-08-06 DIAGNOSIS — IMO0001 Reserved for inherently not codable concepts without codable children: Secondary | ICD-10-CM | POA: Diagnosis not present

## 2012-08-07 ENCOUNTER — Other Ambulatory Visit: Payer: Self-pay | Admitting: Internal Medicine

## 2012-08-10 ENCOUNTER — Other Ambulatory Visit: Payer: Self-pay

## 2012-08-10 MED ORDER — ATENOLOL 25 MG PO TABS: 25.0000 mg | ORAL_TABLET | Freq: Two times a day (BID) | ORAL | Status: DC

## 2012-08-10 MED ORDER — HYDROCHLOROTHIAZIDE 12.5 MG PO CAPS: ORAL_CAPSULE | ORAL | Status: DC

## 2012-08-12 DIAGNOSIS — I10 Essential (primary) hypertension: Secondary | ICD-10-CM | POA: Diagnosis not present

## 2012-08-12 DIAGNOSIS — I495 Sick sinus syndrome: Secondary | ICD-10-CM | POA: Diagnosis not present

## 2012-08-12 DIAGNOSIS — I509 Heart failure, unspecified: Secondary | ICD-10-CM | POA: Diagnosis not present

## 2012-08-12 DIAGNOSIS — E7800 Pure hypercholesterolemia, unspecified: Secondary | ICD-10-CM | POA: Diagnosis not present

## 2012-08-20 DIAGNOSIS — M5137 Other intervertebral disc degeneration, lumbosacral region: Secondary | ICD-10-CM | POA: Diagnosis not present

## 2012-09-01 ENCOUNTER — Encounter: Payer: Self-pay | Admitting: Internal Medicine

## 2012-09-01 ENCOUNTER — Ambulatory Visit (INDEPENDENT_AMBULATORY_CARE_PROVIDER_SITE_OTHER): Payer: Medicare Other | Admitting: Internal Medicine

## 2012-09-01 VITALS — BP 130/78 | HR 70 | Ht 60.0 in | Wt 159.0 lb

## 2012-09-01 DIAGNOSIS — Z950 Presence of cardiac pacemaker: Secondary | ICD-10-CM

## 2012-09-01 DIAGNOSIS — I498 Other specified cardiac arrhythmias: Secondary | ICD-10-CM

## 2012-09-01 DIAGNOSIS — I10 Essential (primary) hypertension: Secondary | ICD-10-CM | POA: Diagnosis not present

## 2012-09-01 LAB — PACEMAKER DEVICE OBSERVATION
AL IMPEDENCE PM: 482 Ohm
AL THRESHOLD: 0.5 V
RV LEAD AMPLITUDE: 11.2 mv

## 2012-09-01 NOTE — Progress Notes (Signed)
HPI [name] returns today for followup. She is a peasant 89 woman with a h/o CHB, s/p PPM insertion, HTN, and dyslipidemia. In the interim, she has done well. She is bothered by some arthritis but has not had chest pain, sob, or syncope. No peripheral edema. No Known Allergies   Current Outpatient Prescriptions  Medication Sig Dispense Refill  . acetaminophen (TYLENOL) 650 MG CR tablet Take 650 mg by mouth as needed.        Marland Kitchen amLODipine (NORVASC) 10 MG tablet TAKE 1 TABLET BY MOUTH EVERY DAY  30 tablet  0  . aspirin 81 MG tablet Take 81 mg by mouth daily.        Marland Kitchen atenolol (TENORMIN) 25 MG tablet TAKE 1 TABLET BY MOUTH TWICE DAILY  180 tablet  0  . calcium-vitamin D (OSCAL WITH D) 500-200 MG-UNIT per tablet Take 1 tablet by mouth daily.        . hydrochlorothiazide (MICROZIDE) 12.5 MG capsule TAKE 1 CAPSULE BY MOUTH EVERY DAY  90 capsule  0  . lisinopril (PRINIVIL,ZESTRIL) 40 MG tablet Take 1 tablet (40 mg total) by mouth daily.  90 tablet  0  . pravastatin (PRAVACHOL) 40 MG tablet Take 40 mg by mouth daily.       No current facility-administered medications for this visit.     Past Medical History  Diagnosis Date  . DJD (degenerative joint disease)   . Osteoporosis   . Anxiety   . Sleep apnea   . Seasonal allergies   . Anemia   . Hypertension     ROS:   All systems reviewed and negative except as noted in the HPI.   Past Surgical History  Procedure Laterality Date  . Pacemaker insertion    . Hemorrhoid surgery    . Cholecystectomy    . Total hip arthroplasty       Family History  Problem Relation Age of Onset  . Stomach cancer Father      History   Social History  . Marital Status: Widowed    Spouse Name: N/A    Number of Children: N/A  . Years of Education: N/A   Occupational History  . Not on file.   Social History Main Topics  . Smoking status: Never Smoker   . Smokeless tobacco: Not on file  . Alcohol Use: No  . Drug Use: No  . Sexually Active:  Not on file   Other Topics Concern  . Not on file   Social History Narrative   Widowed   Daughter     BP 130/78  Pulse 70  Ht 5' (1.524 m)  Wt 159 lb (72.122 kg)  BMI 31.05 kg/m2  SpO2 98%  Physical Exam:  Well appearing elderly woman, who looks younger than her stated age and in NAD HEENT: Unremarkable Neck:  7 cm JVD, no thyromegally Back:  No CVA tenderness Lungs:  Clear with no wheezes, rales, or rhonchi. Well healed PPM insertion. HEART:  Regular rate rhythm, no murmurs, no rubs, no clicks Abd:  soft, positive bowel sounds, no organomegally, no rebound, no guarding Ext:  2 plus pulses, no edema, no cyanosis, no clubbing Skin:  No rashes no nodules Neuro:  CN II through XII intact, motor grossly intact   DEVICE  Normal device function.  See PaceArt for details.   Assess/Plan:

## 2012-09-01 NOTE — Assessment & Plan Note (Signed)
Her blood pressure appears to be well controled. Continue current meds.She is instructed to maintain a low sodium diet.

## 2012-09-01 NOTE — Assessment & Plan Note (Signed)
Her medtronic DDD PPM is working normally. Will recheck in several months. 

## 2012-09-01 NOTE — Patient Instructions (Signed)
Your physician wants you to follow-up in: 1 year with Dr. Court Joy will receive a reminder letter in the mail two months in advance. If you don't receive a letter, please call our office to schedule the follow-up appointment.   No medication changes were made today

## 2012-09-04 ENCOUNTER — Encounter: Payer: Medicare Other | Admitting: Internal Medicine

## 2012-09-04 ENCOUNTER — Encounter: Payer: Self-pay | Admitting: Internal Medicine

## 2012-11-03 DIAGNOSIS — I2510 Atherosclerotic heart disease of native coronary artery without angina pectoris: Secondary | ICD-10-CM | POA: Diagnosis not present

## 2012-11-03 DIAGNOSIS — M1990 Unspecified osteoarthritis, unspecified site: Secondary | ICD-10-CM | POA: Diagnosis not present

## 2012-11-03 DIAGNOSIS — Z23 Encounter for immunization: Secondary | ICD-10-CM | POA: Diagnosis not present

## 2012-11-03 DIAGNOSIS — I10 Essential (primary) hypertension: Secondary | ICD-10-CM | POA: Diagnosis not present

## 2012-11-11 DIAGNOSIS — I495 Sick sinus syndrome: Secondary | ICD-10-CM | POA: Diagnosis not present

## 2012-11-11 DIAGNOSIS — E7800 Pure hypercholesterolemia, unspecified: Secondary | ICD-10-CM | POA: Diagnosis not present

## 2012-11-11 DIAGNOSIS — I2510 Atherosclerotic heart disease of native coronary artery without angina pectoris: Secondary | ICD-10-CM | POA: Diagnosis not present

## 2012-11-11 DIAGNOSIS — I10 Essential (primary) hypertension: Secondary | ICD-10-CM | POA: Diagnosis not present

## 2012-11-13 ENCOUNTER — Other Ambulatory Visit: Payer: Self-pay | Admitting: Internal Medicine

## 2012-12-04 ENCOUNTER — Encounter: Payer: Self-pay | Admitting: Internal Medicine

## 2012-12-04 DIAGNOSIS — I442 Atrioventricular block, complete: Secondary | ICD-10-CM | POA: Diagnosis not present

## 2012-12-16 ENCOUNTER — Encounter: Payer: Self-pay | Admitting: Internal Medicine

## 2013-01-11 ENCOUNTER — Encounter: Payer: Self-pay | Admitting: Internal Medicine

## 2013-02-02 DIAGNOSIS — I951 Orthostatic hypotension: Secondary | ICD-10-CM | POA: Diagnosis not present

## 2013-02-02 DIAGNOSIS — I10 Essential (primary) hypertension: Secondary | ICD-10-CM | POA: Diagnosis not present

## 2013-02-02 DIAGNOSIS — D649 Anemia, unspecified: Secondary | ICD-10-CM | POA: Diagnosis not present

## 2013-02-02 DIAGNOSIS — R7309 Other abnormal glucose: Secondary | ICD-10-CM | POA: Diagnosis not present

## 2013-02-02 DIAGNOSIS — I2510 Atherosclerotic heart disease of native coronary artery without angina pectoris: Secondary | ICD-10-CM | POA: Diagnosis not present

## 2013-02-02 DIAGNOSIS — IMO0002 Reserved for concepts with insufficient information to code with codable children: Secondary | ICD-10-CM | POA: Diagnosis not present

## 2013-02-05 DIAGNOSIS — M159 Polyosteoarthritis, unspecified: Secondary | ICD-10-CM | POA: Diagnosis not present

## 2013-02-05 DIAGNOSIS — I951 Orthostatic hypotension: Secondary | ICD-10-CM | POA: Diagnosis not present

## 2013-02-05 DIAGNOSIS — Z96649 Presence of unspecified artificial hip joint: Secondary | ICD-10-CM | POA: Diagnosis not present

## 2013-02-05 DIAGNOSIS — Z950 Presence of cardiac pacemaker: Secondary | ICD-10-CM | POA: Diagnosis not present

## 2013-02-05 DIAGNOSIS — I10 Essential (primary) hypertension: Secondary | ICD-10-CM | POA: Diagnosis not present

## 2013-02-05 DIAGNOSIS — I2510 Atherosclerotic heart disease of native coronary artery without angina pectoris: Secondary | ICD-10-CM | POA: Diagnosis not present

## 2013-02-17 DIAGNOSIS — I495 Sick sinus syndrome: Secondary | ICD-10-CM | POA: Diagnosis not present

## 2013-02-17 DIAGNOSIS — I509 Heart failure, unspecified: Secondary | ICD-10-CM | POA: Diagnosis not present

## 2013-02-17 DIAGNOSIS — I10 Essential (primary) hypertension: Secondary | ICD-10-CM | POA: Diagnosis not present

## 2013-02-17 DIAGNOSIS — E7800 Pure hypercholesterolemia, unspecified: Secondary | ICD-10-CM | POA: Diagnosis not present

## 2013-03-03 DIAGNOSIS — I10 Essential (primary) hypertension: Secondary | ICD-10-CM | POA: Diagnosis not present

## 2013-03-05 ENCOUNTER — Encounter: Payer: Self-pay | Admitting: Internal Medicine

## 2013-03-05 DIAGNOSIS — I442 Atrioventricular block, complete: Secondary | ICD-10-CM | POA: Diagnosis not present

## 2013-03-05 DIAGNOSIS — Z950 Presence of cardiac pacemaker: Secondary | ICD-10-CM | POA: Diagnosis not present

## 2013-05-04 DIAGNOSIS — I2510 Atherosclerotic heart disease of native coronary artery without angina pectoris: Secondary | ICD-10-CM | POA: Diagnosis not present

## 2013-05-04 DIAGNOSIS — I10 Essential (primary) hypertension: Secondary | ICD-10-CM | POA: Diagnosis not present

## 2013-05-04 DIAGNOSIS — M1990 Unspecified osteoarthritis, unspecified site: Secondary | ICD-10-CM | POA: Diagnosis not present

## 2013-05-25 DIAGNOSIS — I509 Heart failure, unspecified: Secondary | ICD-10-CM | POA: Diagnosis not present

## 2013-05-25 DIAGNOSIS — I359 Nonrheumatic aortic valve disorder, unspecified: Secondary | ICD-10-CM | POA: Diagnosis not present

## 2013-05-25 DIAGNOSIS — I10 Essential (primary) hypertension: Secondary | ICD-10-CM | POA: Diagnosis not present

## 2013-05-25 DIAGNOSIS — E7800 Pure hypercholesterolemia, unspecified: Secondary | ICD-10-CM | POA: Diagnosis not present

## 2013-05-25 DIAGNOSIS — I2510 Atherosclerotic heart disease of native coronary artery without angina pectoris: Secondary | ICD-10-CM | POA: Diagnosis not present

## 2013-05-25 DIAGNOSIS — I495 Sick sinus syndrome: Secondary | ICD-10-CM | POA: Diagnosis not present

## 2013-06-04 ENCOUNTER — Encounter: Payer: Self-pay | Admitting: Internal Medicine

## 2013-06-04 DIAGNOSIS — I442 Atrioventricular block, complete: Secondary | ICD-10-CM | POA: Diagnosis not present

## 2013-06-04 DIAGNOSIS — Z950 Presence of cardiac pacemaker: Secondary | ICD-10-CM | POA: Diagnosis not present

## 2013-08-03 DIAGNOSIS — I2510 Atherosclerotic heart disease of native coronary artery without angina pectoris: Secondary | ICD-10-CM | POA: Diagnosis not present

## 2013-08-03 DIAGNOSIS — I10 Essential (primary) hypertension: Secondary | ICD-10-CM | POA: Diagnosis not present

## 2013-08-24 DIAGNOSIS — M159 Polyosteoarthritis, unspecified: Secondary | ICD-10-CM | POA: Diagnosis not present

## 2013-08-24 DIAGNOSIS — I2510 Atherosclerotic heart disease of native coronary artery without angina pectoris: Secondary | ICD-10-CM | POA: Diagnosis not present

## 2013-08-24 DIAGNOSIS — I10 Essential (primary) hypertension: Secondary | ICD-10-CM | POA: Diagnosis not present

## 2013-08-24 DIAGNOSIS — E785 Hyperlipidemia, unspecified: Secondary | ICD-10-CM | POA: Diagnosis not present

## 2013-08-24 DIAGNOSIS — I509 Heart failure, unspecified: Secondary | ICD-10-CM | POA: Diagnosis not present

## 2013-08-24 DIAGNOSIS — I495 Sick sinus syndrome: Secondary | ICD-10-CM | POA: Diagnosis not present

## 2013-09-03 ENCOUNTER — Encounter: Payer: Self-pay | Admitting: Internal Medicine

## 2013-09-03 DIAGNOSIS — Z950 Presence of cardiac pacemaker: Secondary | ICD-10-CM | POA: Diagnosis not present

## 2013-09-03 DIAGNOSIS — I442 Atrioventricular block, complete: Secondary | ICD-10-CM

## 2013-09-10 ENCOUNTER — Ambulatory Visit (INDEPENDENT_AMBULATORY_CARE_PROVIDER_SITE_OTHER): Payer: Medicare Other | Admitting: Internal Medicine

## 2013-09-10 ENCOUNTER — Encounter: Payer: Self-pay | Admitting: Internal Medicine

## 2013-09-10 VITALS — BP 108/72 | HR 61 | Ht 63.0 in | Wt 157.0 lb

## 2013-09-10 DIAGNOSIS — I498 Other specified cardiac arrhythmias: Secondary | ICD-10-CM

## 2013-09-10 DIAGNOSIS — Z950 Presence of cardiac pacemaker: Secondary | ICD-10-CM

## 2013-09-10 DIAGNOSIS — I10 Essential (primary) hypertension: Secondary | ICD-10-CM

## 2013-09-10 LAB — MDC_IDC_ENUM_SESS_TYPE_INCLINIC
Battery Impedance: 1880 Ohm
Battery Voltage: 2.76 V
Brady Statistic AP VP Percent: 46.3 %
Brady Statistic AP VS Percent: 0.4 %
Brady Statistic AS VP Percent: 51.1 %
Brady Statistic AS VS Percent: 2.2 %
Date Time Interrogation Session: 20150821095511
Lead Channel Impedance Value: 497 Ohm
Lead Channel Impedance Value: 498 Ohm
Lead Channel Pacing Threshold Amplitude: 1 V
Lead Channel Pacing Threshold Pulse Width: 0.4 ms
Lead Channel Pacing Threshold Pulse Width: 0.4 ms
Lead Channel Sensing Intrinsic Amplitude: 11.2 mV
Lead Channel Sensing Intrinsic Amplitude: 2.8 mV
Lead Channel Setting Pacing Amplitude: 2.5 V
Lead Channel Setting Sensing Sensitivity: 2.8 mV
MDC IDC MSMT BATTERY REMAINING LONGEVITY: 31 mo
MDC IDC MSMT LEADCHNL RV PACING THRESHOLD AMPLITUDE: 1 V
MDC IDC SET LEADCHNL RA PACING AMPLITUDE: 2 V
MDC IDC SET LEADCHNL RV PACING PULSEWIDTH: 0.4 ms

## 2013-09-10 NOTE — Patient Instructions (Addendum)
Your physician wants you to follow-up in: 6 months with Gunnar Fusi and for a office visit and Dr. [name] in 1 year. You will receive a reminder letter in the mail two months in advance. If you don't receive a letter, please call our office to schedule the follow-up appointment.  Your physician recommends that you continue on your current medications as directed. Please refer to the Current Medication list given to you today.  Thank you for choosing [facility]!!

## 2013-09-12 ENCOUNTER — Encounter: Payer: Self-pay | Admitting: Internal Medicine

## 2013-09-12 NOTE — Assessment & Plan Note (Signed)
Her Medtronic PPM is working normally. Will recheck in several months.  

## 2013-09-12 NOTE — Progress Notes (Signed)
HPI [name] returns today for followup. She is a peasant 58 woman with a h/o CHB, s/p PPM insertion, HTN, and dyslipidemia. In the interim, she has done well. She is bothered by some arthritis but has not had chest pain, sob, or syncope. No peripheral edema. Her appetite is good but she notes that her energy has gone down some and she has not been quite as active.  No Known Allergies   Current Outpatient Prescriptions  Medication Sig Dispense Refill  . acetaminophen (TYLENOL) 650 MG CR tablet Take 650 mg by mouth as needed.        Marland Kitchen amLODipine (NORVASC) 2.5 MG tablet Take 2.5 mg by mouth daily.      Marland Kitchen aspirin 81 MG tablet Take 81 mg by mouth daily.        Marland Kitchen atenolol (TENORMIN) 25 MG tablet TAKE 1 TABLET BY MOUTH TWICE DAILY  180 tablet  0  . calcium-vitamin D (OSCAL WITH D) 500-200 MG-UNIT per tablet Take 1 tablet by mouth daily.        . hydrochlorothiazide (HYDRODIURIL) 25 MG tablet Take 25 mg by mouth daily.      Marland Kitchen lisinopril (PRINIVIL,ZESTRIL) 40 MG tablet Take 1 tablet (40 mg total) by mouth daily.  90 tablet  0  . pravastatin (PRAVACHOL) 40 MG tablet Take 40 mg by mouth daily.       No current facility-administered medications for this visit.     Past Medical History  Diagnosis Date  . DJD (degenerative joint disease)   . Osteoporosis   . Anxiety   . Sleep apnea   . Seasonal allergies   . Anemia   . Hypertension     ROS:   All systems reviewed and negative except as noted in the HPI.   Past Surgical History  Procedure Laterality Date  . Pacemaker insertion    . Hemorrhoid surgery    . Cholecystectomy    . Total hip arthroplasty       Family History  Problem Relation Age of Onset  . Stomach cancer Father      History   Social History  . Marital Status: Widowed    Spouse Name: N/A    Number of Children: N/A  . Years of Education: N/A   Occupational History  . Not on file.   Social History Main Topics  . Smoking status: Never Smoker   . Smokeless  tobacco: Not on file  . Alcohol Use: No  . Drug Use: No  . Sexual Activity: Not on file   Other Topics Concern  . Not on file   Social History Narrative   Widowed   Daughter     BP 108/72  Pulse 61  Ht 5\' 3"  (1.6 m)  Wt 157 lb (71.215 kg)  BMI 27.82 kg/m2  Physical Exam:  Well appearing elderly woman, who looks younger than her stated age and in NAD HEENT: Unremarkable Neck:  6 cm JVD, no thyromegally Back:  No CVA tenderness Lungs:  Clear with no wheezes, rales, or rhonchi. Well healed PPM insertion. HEART:  Regular rate rhythm, no murmurs, no rubs, no clicks Abd:  soft, positive bowel sounds, no organomegally, no rebound, no guarding Ext:  2 plus pulses, no edema, no cyanosis, no clubbing Skin:  No rashes no nodules Neuro:  CN II through XII intact, motor grossly intact   DEVICE  Normal device function.  See PaceArt for details.   Assess/Plan:

## 2013-09-12 NOTE — Assessment & Plan Note (Signed)
Her blood pressure is well controlled. We considered reducing the dose of her blood pressure meds but she notes that at other MD's visits her pressure has been higher.

## 2013-09-21 ENCOUNTER — Encounter: Payer: Self-pay | Admitting: Internal Medicine

## 2013-10-26 DIAGNOSIS — Z23 Encounter for immunization: Secondary | ICD-10-CM | POA: Diagnosis not present

## 2013-10-26 DIAGNOSIS — I2510 Atherosclerotic heart disease of native coronary artery without angina pectoris: Secondary | ICD-10-CM | POA: Diagnosis not present

## 2013-10-26 DIAGNOSIS — I10 Essential (primary) hypertension: Secondary | ICD-10-CM | POA: Diagnosis not present

## 2013-10-26 DIAGNOSIS — M1990 Unspecified osteoarthritis, unspecified site: Secondary | ICD-10-CM | POA: Diagnosis not present

## 2013-11-23 DIAGNOSIS — I495 Sick sinus syndrome: Secondary | ICD-10-CM | POA: Diagnosis not present

## 2013-11-23 DIAGNOSIS — M1990 Unspecified osteoarthritis, unspecified site: Secondary | ICD-10-CM | POA: Diagnosis not present

## 2013-11-23 DIAGNOSIS — I509 Heart failure, unspecified: Secondary | ICD-10-CM | POA: Diagnosis not present

## 2013-11-23 DIAGNOSIS — I2510 Atherosclerotic heart disease of native coronary artery without angina pectoris: Secondary | ICD-10-CM | POA: Diagnosis not present

## 2013-11-23 DIAGNOSIS — E785 Hyperlipidemia, unspecified: Secondary | ICD-10-CM | POA: Diagnosis not present

## 2013-11-23 DIAGNOSIS — I10 Essential (primary) hypertension: Secondary | ICD-10-CM | POA: Diagnosis not present

## 2013-12-03 ENCOUNTER — Encounter: Payer: Self-pay | Admitting: Internal Medicine

## 2013-12-03 DIAGNOSIS — I442 Atrioventricular block, complete: Secondary | ICD-10-CM | POA: Diagnosis not present

## 2013-12-03 DIAGNOSIS — Z950 Presence of cardiac pacemaker: Secondary | ICD-10-CM | POA: Diagnosis not present

## 2014-01-18 DIAGNOSIS — M1990 Unspecified osteoarthritis, unspecified site: Secondary | ICD-10-CM | POA: Diagnosis not present

## 2014-01-18 DIAGNOSIS — D649 Anemia, unspecified: Secondary | ICD-10-CM | POA: Diagnosis not present

## 2014-01-18 DIAGNOSIS — I2510 Atherosclerotic heart disease of native coronary artery without angina pectoris: Secondary | ICD-10-CM | POA: Diagnosis not present

## 2014-01-18 DIAGNOSIS — I10 Essential (primary) hypertension: Secondary | ICD-10-CM | POA: Diagnosis not present

## 2014-01-20 DIAGNOSIS — M1990 Unspecified osteoarthritis, unspecified site: Secondary | ICD-10-CM | POA: Diagnosis not present

## 2014-01-20 DIAGNOSIS — I2510 Atherosclerotic heart disease of native coronary artery without angina pectoris: Secondary | ICD-10-CM | POA: Diagnosis not present

## 2014-01-20 DIAGNOSIS — I10 Essential (primary) hypertension: Secondary | ICD-10-CM | POA: Diagnosis not present

## 2014-01-20 DIAGNOSIS — D649 Anemia, unspecified: Secondary | ICD-10-CM | POA: Diagnosis not present

## 2014-02-22 DIAGNOSIS — I2510 Atherosclerotic heart disease of native coronary artery without angina pectoris: Secondary | ICD-10-CM | POA: Diagnosis not present

## 2014-02-22 DIAGNOSIS — I495 Sick sinus syndrome: Secondary | ICD-10-CM | POA: Diagnosis not present

## 2014-02-22 DIAGNOSIS — M1990 Unspecified osteoarthritis, unspecified site: Secondary | ICD-10-CM | POA: Diagnosis not present

## 2014-02-22 DIAGNOSIS — I5030 Unspecified diastolic (congestive) heart failure: Secondary | ICD-10-CM | POA: Diagnosis not present

## 2014-02-22 DIAGNOSIS — I10 Essential (primary) hypertension: Secondary | ICD-10-CM | POA: Diagnosis not present

## 2014-02-22 DIAGNOSIS — E785 Hyperlipidemia, unspecified: Secondary | ICD-10-CM | POA: Diagnosis not present

## 2014-03-04 DIAGNOSIS — Z950 Presence of cardiac pacemaker: Secondary | ICD-10-CM | POA: Diagnosis not present

## 2014-03-04 DIAGNOSIS — I442 Atrioventricular block, complete: Secondary | ICD-10-CM | POA: Diagnosis not present

## 2014-03-11 ENCOUNTER — Ambulatory Visit (INDEPENDENT_AMBULATORY_CARE_PROVIDER_SITE_OTHER): Payer: Medicare Other | Admitting: *Deleted

## 2014-03-11 DIAGNOSIS — I442 Atrioventricular block, complete: Secondary | ICD-10-CM

## 2014-03-11 LAB — MDC_IDC_ENUM_SESS_TYPE_INCLINIC
Battery Impedance: 2147 Ohm
Battery Remaining Longevity: 27 mo
Battery Voltage: 2.75 V
Brady Statistic AP VP Percent: 61.9 %
Brady Statistic AP VS Percent: 4 %
Brady Statistic AS VP Percent: 36.2 %
Brady Statistic AS VS Percent: 1.5 %
Date Time Interrogation Session: 20160219134409
Lead Channel Impedance Value: 483 Ohm
Lead Channel Impedance Value: 505 Ohm
Lead Channel Pacing Threshold Amplitude: 1 V
Lead Channel Pacing Threshold Amplitude: 1 V
Lead Channel Pacing Threshold Pulse Width: 0.4 ms
Lead Channel Pacing Threshold Pulse Width: 0.4 ms
Lead Channel Sensing Intrinsic Amplitude: 11.2 mV
Lead Channel Sensing Intrinsic Amplitude: 2.8 mV
Lead Channel Setting Pacing Amplitude: 2 V
Lead Channel Setting Pacing Amplitude: 2.5 V
Lead Channel Setting Pacing Pulse Width: 0.4 ms
Lead Channel Setting Sensing Sensitivity: 2.8 mV

## 2014-03-11 NOTE — Progress Notes (Signed)
PPM check in office. 

## 2014-03-29 ENCOUNTER — Encounter: Payer: Self-pay | Admitting: Internal Medicine

## 2014-04-18 DIAGNOSIS — Z959 Presence of cardiac and vascular implant and graft, unspecified: Secondary | ICD-10-CM | POA: Diagnosis not present

## 2014-04-18 DIAGNOSIS — I10 Essential (primary) hypertension: Secondary | ICD-10-CM | POA: Diagnosis not present

## 2014-04-18 DIAGNOSIS — M1990 Unspecified osteoarthritis, unspecified site: Secondary | ICD-10-CM | POA: Diagnosis not present

## 2014-04-18 DIAGNOSIS — I2510 Atherosclerotic heart disease of native coronary artery without angina pectoris: Secondary | ICD-10-CM | POA: Diagnosis not present

## 2014-05-25 ENCOUNTER — Ambulatory Visit (INDEPENDENT_AMBULATORY_CARE_PROVIDER_SITE_OTHER): Payer: Medicare Other | Admitting: Cardiovascular Disease

## 2014-05-25 ENCOUNTER — Encounter: Payer: Self-pay | Admitting: Cardiovascular Disease

## 2014-05-25 VITALS — BP 158/84 | HR 67 | Ht 63.0 in | Wt 159.0 lb

## 2014-05-25 DIAGNOSIS — I2510 Atherosclerotic heart disease of native coronary artery without angina pectoris: Secondary | ICD-10-CM | POA: Diagnosis not present

## 2014-05-25 DIAGNOSIS — Z950 Presence of cardiac pacemaker: Secondary | ICD-10-CM | POA: Diagnosis not present

## 2014-05-25 DIAGNOSIS — R42 Dizziness and giddiness: Secondary | ICD-10-CM | POA: Diagnosis not present

## 2014-05-25 DIAGNOSIS — I10 Essential (primary) hypertension: Secondary | ICD-10-CM | POA: Diagnosis not present

## 2014-05-25 DIAGNOSIS — I2583 Coronary atherosclerosis due to lipid rich plaque: Secondary | ICD-10-CM

## 2014-05-25 MED ORDER — AMLODIPINE BESYLATE 5 MG PO TABS: 5.0000 mg | ORAL_TABLET | Freq: Every day | ORAL | Status: AC

## 2014-05-25 NOTE — Patient Instructions (Addendum)
Your physician wants you to follow-up in: 6 months with Dr.Koneswaran You will receive a reminder letter in the mail two months in advance. If you don't receive a letter, please call our office to schedule the follow-up appointment.   INCREASE Amlodipine to 5 mg daily  Come back in 3 weeks for nurse BP apt      Thank you for choosing [facility] !

## 2014-05-25 NOTE — Progress Notes (Signed)
Patient ID: [name], female   DOB: November 27, 1920, 81 y.o.   MRN: [id number]      SUBJECTIVE: The patient is a 81 year old woman who I am evaluating for the first time. She has a history of coronary artery disease and underwent coronary angiography in October 2012 which demonstrated 20-25% proximal LAD stenosis, 65-70% mid focal LAD stenosis, 30-40% distal LAD stenosis, calcified LAD, first diagonal branch was moderate size was 20-30% mid vessel stenosis, 40-50% mid left circumflex coronary artery stenosis, and mild disease in the RCA with mid vessel 15-20% stenosis, and normal left ventricular systolic function. She also has a history of symptomatic bradycardia has a pacemaker and is followed by Dr. [name].  She denies exertional chest pain and shortness of breath, as well as leg swelling and syncope. She does have dizziness when she exerts herself , but does not say it is particularly bothersome. She feels she has slowed down. She likes to sew. She sometimes gets confused.     Review of Systems: As per "subjective", otherwise negative.  No Known Allergies  Current Outpatient Prescriptions  Medication Sig Dispense Refill  . acetaminophen (TYLENOL) 650 MG CR tablet Take 650 mg by mouth as needed.      Marland Kitchen amLODipine (NORVASC) 2.5 MG tablet Take 2.5 mg by mouth daily.    Marland Kitchen aspirin 81 MG tablet Take 81 mg by mouth daily.      Marland Kitchen atenolol (TENORMIN) 25 MG tablet TAKE 1 TABLET BY MOUTH TWICE DAILY 180 tablet 0  . calcium-vitamin D (OSCAL WITH D) 500-200 MG-UNIT per tablet Take 1 tablet by mouth daily.      . hydrochlorothiazide (HYDRODIURIL) 25 MG tablet Take 25 mg by mouth daily.    Marland Kitchen lisinopril (PRINIVIL,ZESTRIL) 40 MG tablet Take 1 tablet (40 mg total) by mouth daily. 90 tablet 0  . pravastatin (PRAVACHOL) 40 MG tablet Take 40 mg by mouth daily.     No current facility-administered medications for this visit.    Past Medical History  Diagnosis Date  . DJD (degenerative joint disease)     . Osteoporosis   . Anxiety   . Sleep apnea   . Seasonal allergies   . Anemia   . Hypertension     Past Surgical History  Procedure Laterality Date  . Pacemaker insertion    . Hemorrhoid surgery    . Cholecystectomy    . Total hip arthroplasty      History   Social History  . Marital Status: Widowed    Spouse Name: N/A  . Number of Children: N/A  . Years of Education: N/A   Occupational History  . Not on file.   Social History Main Topics  . Smoking status: Never Smoker   . Smokeless tobacco: Not on file  . Alcohol Use: No  . Drug Use: No  . Sexual Activity: Not on file   Other Topics Concern  . Not on file   Social History Narrative   Widowed   Daughter     Filed Vitals:   05/25/14 1150  BP: 158/84  Pulse: 67  Height:  (1.6 m)  Weight: 159 lb (72.122 kg)  SpO2: 99%    PHYSICAL EXAM General: NAD HEENT: Normal. Neck: No JVD, no thyromegaly. Lungs: Clear to auscultation bilaterally with normal respiratory effort. CV: Nondisplaced PMI.  Regular rate and rhythm, normal S1/S2, no S3/S4, 2/6 systolic murmur over RUSB. No pretibial or periankle edema.  No carotid bruit.   Abdomen: Soft, nontender,  no distention.  Neurologic: Alert and oriented.  Psych: Normal affect. Skin: Normal. Musculoskeletal: No gross deformities. Extremities: No clubbing or cyanosis.   ECG: Most recent ECG reviewed.      ASSESSMENT AND PLAN: 1. CAD: Stable ischemic heart disease. Continue aspirin, atenolol, and pravastatin.  2. Symptomatic bradycardia s/p pacemaker: Device interrogation on 03/11/14 demonstrated normal device function with 74 mode switches, the longest episode lasting more than one hour with no high ventricular rates noted.  3. Essential HTN: Elevated today. Had a lengthy discussion with her daughter and patient. Will increase amlodipine to 5 mg to see if dizziness is associated with elevated BP, and have her return for a nurse visit to have BP checked in  three weeks. If it is low, will reduce back to 2.5 mg. If BP is normal and symptoms have improved, will continue 5 mg.  4. Dizziness: Uncertain whether due to mode switches or elevated BP. See #3.  Dispo: f/u for nurse visit in 3 weeks, f/u with me in 6 months.   Time spent: 40 minutes, of which greater than 50% was spent reviewing symptoms, relevant blood tests and studies, and discussing management plan with the patient.  Prentice Docker, M.D., F.A.C.C.

## 2014-06-14 ENCOUNTER — Ambulatory Visit (INDEPENDENT_AMBULATORY_CARE_PROVIDER_SITE_OTHER): Payer: Medicare Other | Admitting: *Deleted

## 2014-06-14 VITALS — BP 138/76 | HR 64 | Ht 60.0 in | Wt 161.5 lb

## 2014-06-14 DIAGNOSIS — Z136 Encounter for screening for cardiovascular disorders: Secondary | ICD-10-CM

## 2014-06-14 DIAGNOSIS — Z0130 Encounter for examination of blood pressure without abnormal findings: Secondary | ICD-10-CM

## 2014-06-14 NOTE — Patient Instructions (Addendum)
Your physician recommends that you schedule a follow-up appointment in:  TBD   Your Blood pressure today is 138/76   Thank you for choosing [facility]!

## 2014-06-14 NOTE — Progress Notes (Signed)
Patient voice no concerns at this time. Will forward to Dr. Abigail Butts for review .

## 2014-07-19 DIAGNOSIS — I2510 Atherosclerotic heart disease of native coronary artery without angina pectoris: Secondary | ICD-10-CM | POA: Diagnosis not present

## 2014-07-19 DIAGNOSIS — Z23 Encounter for immunization: Secondary | ICD-10-CM | POA: Diagnosis not present

## 2014-07-19 DIAGNOSIS — I10 Essential (primary) hypertension: Secondary | ICD-10-CM | POA: Diagnosis not present

## 2014-07-19 DIAGNOSIS — M1990 Unspecified osteoarthritis, unspecified site: Secondary | ICD-10-CM | POA: Diagnosis not present

## 2014-07-19 DIAGNOSIS — E669 Obesity, unspecified: Secondary | ICD-10-CM | POA: Diagnosis not present

## 2014-09-02 DIAGNOSIS — I442 Atrioventricular block, complete: Secondary | ICD-10-CM | POA: Diagnosis not present

## 2014-09-02 DIAGNOSIS — Z950 Presence of cardiac pacemaker: Secondary | ICD-10-CM | POA: Diagnosis not present

## 2014-09-30 ENCOUNTER — Encounter: Payer: Self-pay | Admitting: Internal Medicine

## 2014-09-30 ENCOUNTER — Ambulatory Visit (INDEPENDENT_AMBULATORY_CARE_PROVIDER_SITE_OTHER): Payer: Medicare Other | Admitting: Internal Medicine

## 2014-09-30 VITALS — BP 128/72 | HR 89 | Ht 63.0 in | Wt 158.6 lb

## 2014-09-30 DIAGNOSIS — I442 Atrioventricular block, complete: Secondary | ICD-10-CM | POA: Diagnosis not present

## 2014-09-30 DIAGNOSIS — Z950 Presence of cardiac pacemaker: Secondary | ICD-10-CM | POA: Diagnosis not present

## 2014-09-30 DIAGNOSIS — I10 Essential (primary) hypertension: Secondary | ICD-10-CM | POA: Diagnosis not present

## 2014-09-30 DIAGNOSIS — I2510 Atherosclerotic heart disease of native coronary artery without angina pectoris: Secondary | ICD-10-CM | POA: Diagnosis not present

## 2014-09-30 LAB — CUP PACEART INCLINIC DEVICE CHECK
Battery Remaining Longevity: 20 mo
Battery Voltage: 2.74 V
Date Time Interrogation Session: 20160909103421
Lead Channel Pacing Threshold Amplitude: 1 V
Lead Channel Pacing Threshold Pulse Width: 0.4 ms
Lead Channel Sensing Intrinsic Amplitude: 11.2 mV
Lead Channel Setting Pacing Amplitude: 2.5 V
Lead Channel Setting Sensing Sensitivity: 2.8 mV
MDC IDC MSMT BATTERY IMPEDANCE: 2609 Ohm
MDC IDC MSMT LEADCHNL RA IMPEDANCE VALUE: 478 Ohm
MDC IDC MSMT LEADCHNL RA PACING THRESHOLD AMPLITUDE: 1 V
MDC IDC MSMT LEADCHNL RA PACING THRESHOLD PULSEWIDTH: 0.4 ms
MDC IDC MSMT LEADCHNL RA SENSING INTR AMPL: 2.8 mV
MDC IDC MSMT LEADCHNL RV IMPEDANCE VALUE: 472 Ohm
MDC IDC SET LEADCHNL RA PACING AMPLITUDE: 2 V
MDC IDC SET LEADCHNL RV PACING PULSEWIDTH: 0.4 ms
MDC IDC STAT BRADY AP VP PERCENT: 74.6 %
MDC IDC STAT BRADY AP VS PERCENT: 0.5 %
MDC IDC STAT BRADY AS VP PERCENT: 23.1 %
MDC IDC STAT BRADY AS VS PERCENT: 1.8 %

## 2014-09-30 NOTE — Patient Instructions (Addendum)
Your physician wants you to follow-up in: 1 year Dr [name] Bonita Quin will receive a reminder letter in the mail two months in advance. If you don't receive a letter, please call our office to schedule the follow-up appointment.     Your physician recommends that you continue on your current medications as directed. Please refer to the Current Medication list given to you today.       Thank you for choosing [facility] !

## 2014-09-30 NOTE — Progress Notes (Signed)
HPI [name] returns today for followup. She is a peasant 62 woman with a h/o CHB, s/p PPM insertion, HTN, and dyslipidemia. In the interim, she has done well. She is bothered by some arthritis but has not had chest pain, sob, or syncope. No peripheral edema. Her appetite is good but she notes that her energy has gone down some and she has not been quite as active. She also notes some forgetfullness. No Known Allergies   Current Outpatient Prescriptions  Medication Sig Dispense Refill  . acetaminophen (TYLENOL) 650 MG CR tablet Take 650 mg by mouth as needed.      Marland Kitchen amLODipine (NORVASC) 5 MG tablet Take 1 tablet (5 mg total) by mouth daily. 90 tablet 3  . aspirin 81 MG tablet Take 81 mg by mouth daily.      Marland Kitchen atenolol (TENORMIN) 25 MG tablet TAKE 1 TABLET BY MOUTH TWICE DAILY 180 tablet 0  . calcium-vitamin D (OSCAL WITH D) 500-200 MG-UNIT per tablet Take 1 tablet by mouth daily.      . ferrous sulfate 325 (65 FE) MG EC tablet Take 65 mg by mouth 3 (three) times daily with meals.    . hydrochlorothiazide (HYDRODIURIL) 25 MG tablet Take 25 mg by mouth daily.    Marland Kitchen lisinopril (PRINIVIL,ZESTRIL) 40 MG tablet Take 1 tablet (40 mg total) by mouth daily. 90 tablet 0  . pravastatin (PRAVACHOL) 40 MG tablet Take 40 mg by mouth daily.     No current facility-administered medications for this visit.     Past Medical History  Diagnosis Date  . DJD (degenerative joint disease)   . Osteoporosis   . Anxiety   . Sleep apnea   . Seasonal allergies   . Anemia   . Hypertension     ROS:   All systems reviewed and negative except as noted in the HPI.   Past Surgical History  Procedure Laterality Date  . Pacemaker insertion    . Hemorrhoid surgery    . Cholecystectomy    . Total hip arthroplasty       Family History  Problem Relation Age of Onset  . Stomach cancer Father      Social History   Social History  . Marital Status: Widowed    Spouse Name: N/A  . Number of Children: N/A   . Years of Education: N/A   Occupational History  . Not on file.   Social History Main Topics  . Smoking status: Never Smoker   . Smokeless tobacco: Not on file  . Alcohol Use: No  . Drug Use: No  . Sexual Activity: Not on file   Other Topics Concern  . Not on file   Social History Narrative   Widowed   Daughter     BP 128/72 mmHg  Pulse 89  Ht 5\' 3"  (1.6 m)  Wt 158 lb 9.6 oz (71.94 kg)  BMI 28.10 kg/m2  SpO2 99%  Physical Exam:  Well appearing elderly woman, who looks younger than her stated age and in NAD HEENT: Unremarkable Neck:  6 cm JVD, no thyromegally Back:  No CVA tenderness Lungs:  Clear with no wheezes, rales, or rhonchi. Well healed PPM insertion. HEART:  Regular rate rhythm, no murmurs, no rubs, no clicks Abd:  soft, positive bowel sounds, no organomegally, no rebound, no guarding Ext:  2 plus pulses, no edema, no cyanosis, no clubbing Skin:  No rashes no nodules Neuro:  CN II through XII intact, motor grossly intact  DEVICE  Normal device function.  See PaceArt for details.   Assess/Plan:

## 2014-09-30 NOTE — Assessment & Plan Note (Signed)
Her blood pressure is well controlled. Will recheck in several months. 

## 2014-09-30 NOTE — Assessment & Plan Note (Signed)
She is encouraged to maintain a low fat diet.  

## 2014-09-30 NOTE — Assessment & Plan Note (Signed)
Her [employer] DDD PM is working normally. Will recheck in several months. 

## 2014-10-18 DIAGNOSIS — I10 Essential (primary) hypertension: Secondary | ICD-10-CM | POA: Diagnosis not present

## 2014-10-18 DIAGNOSIS — Z23 Encounter for immunization: Secondary | ICD-10-CM | POA: Diagnosis not present

## 2014-10-18 DIAGNOSIS — I2510 Atherosclerotic heart disease of native coronary artery without angina pectoris: Secondary | ICD-10-CM | POA: Diagnosis not present

## 2014-10-18 DIAGNOSIS — M1990 Unspecified osteoarthritis, unspecified site: Secondary | ICD-10-CM | POA: Diagnosis not present

## 2014-10-18 DIAGNOSIS — D649 Anemia, unspecified: Secondary | ICD-10-CM | POA: Diagnosis not present

## 2014-10-26 DIAGNOSIS — D649 Anemia, unspecified: Secondary | ICD-10-CM | POA: Diagnosis not present

## 2014-10-26 DIAGNOSIS — Z23 Encounter for immunization: Secondary | ICD-10-CM | POA: Diagnosis not present

## 2014-10-26 DIAGNOSIS — I2510 Atherosclerotic heart disease of native coronary artery without angina pectoris: Secondary | ICD-10-CM | POA: Diagnosis not present

## 2014-10-26 DIAGNOSIS — I10 Essential (primary) hypertension: Secondary | ICD-10-CM | POA: Diagnosis not present

## 2014-11-21 ENCOUNTER — Encounter: Payer: Self-pay | Admitting: Cardiovascular Disease

## 2014-11-21 ENCOUNTER — Ambulatory Visit (INDEPENDENT_AMBULATORY_CARE_PROVIDER_SITE_OTHER): Payer: Medicare Other | Admitting: Cardiovascular Disease

## 2014-11-21 VITALS — BP 126/72 | HR 67 | Ht 63.0 in | Wt 162.4 lb

## 2014-11-21 DIAGNOSIS — R5383 Other fatigue: Secondary | ICD-10-CM

## 2014-11-21 DIAGNOSIS — I442 Atrioventricular block, complete: Secondary | ICD-10-CM | POA: Diagnosis not present

## 2014-11-21 DIAGNOSIS — I10 Essential (primary) hypertension: Secondary | ICD-10-CM | POA: Diagnosis not present

## 2014-11-21 DIAGNOSIS — R531 Weakness: Secondary | ICD-10-CM | POA: Diagnosis not present

## 2014-11-21 DIAGNOSIS — I25118 Atherosclerotic heart disease of native coronary artery with other forms of angina pectoris: Secondary | ICD-10-CM

## 2014-11-21 DIAGNOSIS — I2510 Atherosclerotic heart disease of native coronary artery without angina pectoris: Secondary | ICD-10-CM | POA: Diagnosis not present

## 2014-11-21 NOTE — Patient Instructions (Signed)
Your physician wants you to follow-up in: 6 months with Dr Koneswaran You will receive a reminder letter in the mail two months in advance. If you don't receive a letter, please call our office to schedule the follow-up appointment.    Your physician recommends that you continue on your current medications as directed. Please refer to the Current Medication list given to you today.      If you need a refill on your cardiac medications before your next appointment, please call your pharmacy.      Thank you for choosing [facility] !        

## 2014-11-21 NOTE — Progress Notes (Signed)
Patient ID: [name], female   DOB: February 21, 1920, 81 y.o.   MRN: [id number]      SUBJECTIVE: The patient returns for routine follow-up. She has a history of coronary artery disease and underwent coronary angiography in October 2012 which demonstrated 20-25% proximal LAD stenosis, 65-70% mid focal LAD stenosis, 30-40% distal LAD stenosis, calcified LAD, first diagonal branch was moderate size was 20-30% mid vessel stenosis, 40-50% mid left circumflex coronary artery stenosis, and mild disease in the RCA with mid vessel 15-20% stenosis, and normal left ventricular systolic function. She also has a history of symptomatic bradycardia has a pacemaker and is followed by Dr. [name].  She denies exertional chest pain and shortness of breath, as well as leg swelling and syncope.   However, she has been gradually becoming more fatigued over the past year. Her daughter concurs. That being said, the patient keeps saying "I've had good health and I'm happy with my life", particularly when we spoke about further cardiac testing and/or coronary angiography.   Review of Systems: As per "subjective", otherwise negative.  No Known Allergies  Current Outpatient Prescriptions  Medication Sig Dispense Refill  . acetaminophen (TYLENOL) 650 MG CR tablet Take 650 mg by mouth as needed.      Marland Kitchen amLODipine (NORVASC) 5 MG tablet Take 1 tablet (5 mg total) by mouth daily. 90 tablet 3  . aspirin 81 MG tablet Take 81 mg by mouth daily.      Marland Kitchen atenolol (TENORMIN) 25 MG tablet TAKE 1 TABLET BY MOUTH TWICE DAILY 180 tablet 0  . calcium-vitamin D (OSCAL WITH D) 500-200 MG-UNIT per tablet Take 1 tablet by mouth daily.      . ferrous sulfate 325 (65 FE) MG EC tablet Take 65 mg by mouth 3 (three) times daily with meals.    . hydrochlorothiazide (HYDRODIURIL) 25 MG tablet Take 25 mg by mouth daily.    Marland Kitchen lisinopril (PRINIVIL,ZESTRIL) 40 MG tablet Take 1 tablet (40 mg total) by mouth daily. 90 tablet 0  . pravastatin  (PRAVACHOL) 40 MG tablet Take 40 mg by mouth daily.     No current facility-administered medications for this visit.    Past Medical History  Diagnosis Date  . DJD (degenerative joint disease)   . Osteoporosis   . Anxiety   . Sleep apnea   . Seasonal allergies   . Anemia   . Hypertension     Past Surgical History  Procedure Laterality Date  . Pacemaker insertion    . Hemorrhoid surgery    . Cholecystectomy    . Total hip arthroplasty      Social History   Social History  . Marital Status: Widowed    Spouse Name: N/A  . Number of Children: N/A  . Years of Education: N/A   Occupational History  . Not on file.   Social History Main Topics  . Smoking status: Never Smoker   . Smokeless tobacco: Not on file  . Alcohol Use: No  . Drug Use: No  . Sexual Activity: Not on file   Other Topics Concern  . Not on file   Social History Narrative   Widowed   Daughter     Filed Vitals:   11/21/14 1009  BP: 126/72  Pulse: 67  Height:  (1.6 m)  Weight: 162 lb 6.4 oz (73.664 kg)  SpO2: 99%    PHYSICAL EXAM General: NAD HEENT: Normal. Neck: No JVD, no thyromegaly. Lungs: Clear to auscultation bilaterally with normal  respiratory effort. CV: Nondisplaced PMI. Regular rate and rhythm, normal S1/S2, no S3/S4, 2/6 systolic murmur loudest over RUSB, but heard along left sternal border as well. No pretibial or periankle edema.  Abdomen: Soft, nontender, no distention.  Neurologic: Alert and oriented.  Psych: Normal affect. Skin: Normal. Musculoskeletal: No gross deformities. Extremities: No clubbing or cyanosis.   ECG: Most recent ECG reviewed.      ASSESSMENT AND PLAN: 1. Weakness and fatigue in context of CAD: I had a lengthy discussion with the patient and her daughter. We talked about options of stress testing and potentially coronary angiography with stent placement for symptom relief. For the time being, the patient would prefer to hold off on this.  I think this is a very reasonable choice. I did inform her that should she change her mind, I would be happy to speak with her again. Continue aspirin, atenolol, and pravastatin.  2. Symptomatic bradycardia s/p pacemaker: Device interrogation on 09/30/14 demonstrated normal device function. Follows with Dr. [name].  3. Essential HTN: Controlled. No changes.   Dispo: f/u 6 months.    Prentice Docker, M.D., F.A.C.C.

## 2014-12-02 ENCOUNTER — Encounter: Payer: Self-pay | Admitting: Internal Medicine

## 2014-12-02 DIAGNOSIS — Z950 Presence of cardiac pacemaker: Secondary | ICD-10-CM | POA: Diagnosis not present

## 2014-12-02 DIAGNOSIS — I442 Atrioventricular block, complete: Secondary | ICD-10-CM | POA: Diagnosis not present

## 2015-01-09 DIAGNOSIS — M160 Bilateral primary osteoarthritis of hip: Secondary | ICD-10-CM | POA: Diagnosis not present

## 2015-01-09 DIAGNOSIS — I10 Essential (primary) hypertension: Secondary | ICD-10-CM | POA: Diagnosis not present

## 2015-01-09 DIAGNOSIS — Z959 Presence of cardiac and vascular implant and graft, unspecified: Secondary | ICD-10-CM | POA: Diagnosis not present

## 2015-01-09 DIAGNOSIS — I2510 Atherosclerotic heart disease of native coronary artery without angina pectoris: Secondary | ICD-10-CM | POA: Diagnosis not present

## 2015-03-03 ENCOUNTER — Encounter: Payer: Self-pay | Admitting: Internal Medicine

## 2015-03-03 DIAGNOSIS — I442 Atrioventricular block, complete: Secondary | ICD-10-CM | POA: Diagnosis not present

## 2015-03-03 DIAGNOSIS — Z950 Presence of cardiac pacemaker: Secondary | ICD-10-CM | POA: Diagnosis not present

## 2015-03-07 DIAGNOSIS — I10 Essential (primary) hypertension: Secondary | ICD-10-CM | POA: Diagnosis not present

## 2015-03-07 DIAGNOSIS — M160 Bilateral primary osteoarthritis of hip: Secondary | ICD-10-CM | POA: Diagnosis not present

## 2015-03-07 DIAGNOSIS — I2510 Atherosclerotic heart disease of native coronary artery without angina pectoris: Secondary | ICD-10-CM | POA: Diagnosis not present

## 2015-03-07 DIAGNOSIS — R0602 Shortness of breath: Secondary | ICD-10-CM | POA: Diagnosis not present

## 2015-03-08 ENCOUNTER — Ambulatory Visit (INDEPENDENT_AMBULATORY_CARE_PROVIDER_SITE_OTHER): Payer: Medicare Other | Admitting: Physician Assistant

## 2015-03-08 ENCOUNTER — Telehealth: Payer: Self-pay | Admitting: *Deleted

## 2015-03-08 ENCOUNTER — Encounter: Payer: Self-pay | Admitting: Physician Assistant

## 2015-03-08 ENCOUNTER — Other Ambulatory Visit (HOSPITAL_COMMUNITY)
Admission: RE | Admit: 2015-03-08 | Discharge: 2015-03-08 | Disposition: A | Discharge status: Home / Self Care | Payer: Medicare Other | Source: Ambulatory Visit | Attending: Physician Assistant | Admitting: Physician Assistant

## 2015-03-08 VITALS — BP 130/78 | HR 63 | Ht 62.0 in | Wt 173.0 lb

## 2015-03-08 DIAGNOSIS — Z79899 Other long term (current) drug therapy: Secondary | ICD-10-CM

## 2015-03-08 DIAGNOSIS — I499 Cardiac arrhythmia, unspecified: Secondary | ICD-10-CM

## 2015-03-08 DIAGNOSIS — R011 Cardiac murmur, unspecified: Secondary | ICD-10-CM | POA: Insufficient documentation

## 2015-03-08 DIAGNOSIS — I509 Heart failure, unspecified: Secondary | ICD-10-CM | POA: Diagnosis not present

## 2015-03-08 DIAGNOSIS — I10 Essential (primary) hypertension: Secondary | ICD-10-CM

## 2015-03-08 DIAGNOSIS — Z950 Presence of cardiac pacemaker: Secondary | ICD-10-CM

## 2015-03-08 DIAGNOSIS — I2510 Atherosclerotic heart disease of native coronary artery without angina pectoris: Secondary | ICD-10-CM

## 2015-03-08 LAB — CBC WITH DIFFERENTIAL/PLATELET
Basophils Absolute: 0 10*3/uL (ref 0.0–0.1)
Basophils Relative: 0 %
EOS ABS: 0.1 10*3/uL (ref 0.0–0.7)
EOS PCT: 1 %
HCT: 39.7 % (ref 36.0–46.0)
Hemoglobin: 13.4 g/dL (ref 12.0–15.0)
LYMPHS ABS: 0.9 10*3/uL (ref 0.7–4.0)
LYMPHS PCT: 16 %
MCH: 32 pg (ref 26.0–34.0)
MCHC: 33.8 g/dL (ref 30.0–36.0)
MCV: 94.7 fL (ref 78.0–100.0)
MONO ABS: 0.5 10*3/uL (ref 0.1–1.0)
MONOS PCT: 8 %
Neutro Abs: 4.3 10*3/uL (ref 1.7–7.7)
Neutrophils Relative %: 75 %
PLATELETS: 131 10*3/uL — AB (ref 150–400)
RBC: 4.19 MIL/uL (ref 3.87–5.11)
RDW: 13.9 % (ref 11.5–15.5)
WBC: 5.7 10*3/uL (ref 4.0–10.5)

## 2015-03-08 LAB — COMPREHENSIVE METABOLIC PANEL
ALBUMIN: 3.6 g/dL (ref 3.5–5.0)
ALT: 38 U/L (ref 14–54)
AST: 43 U/L — AB (ref 15–41)
Alkaline Phosphatase: 56 U/L (ref 38–126)
Anion gap: 12 (ref 5–15)
BUN: 15 mg/dL (ref 6–20)
CHLORIDE: 100 mmol/L — AB (ref 101–111)
CO2: 29 mmol/L (ref 22–32)
CREATININE: 0.81 mg/dL (ref 0.44–1.00)
Calcium: 9.4 mg/dL (ref 8.9–10.3)
GFR calc Af Amer: >60 mL/min (ref >60)
GLUCOSE: 103 mg/dL — AB (ref 65–99)
POTASSIUM: 3.4 mmol/L — AB (ref 3.5–5.1)
Sodium: 141 mmol/L (ref 135–145)
Total Bilirubin: 0.7 mg/dL (ref 0.3–1.2)
Total Protein: 6.5 g/dL (ref 6.5–8.1)

## 2015-03-08 LAB — TSH: TSH: 3.302 u[IU]/mL (ref 0.350–4.500)

## 2015-03-08 MED ORDER — FUROSEMIDE 40 MG PO TABS
40.0000 mg | ORAL_TABLET | Freq: Every day | ORAL | Status: DC
Start: 2015-03-08 — End: 2016-02-05

## 2015-03-08 MED ORDER — POTASSIUM CHLORIDE CRYS ER 20 MEQ PO TBCR: 20.0000 meq | EXTENDED_RELEASE_TABLET | Freq: Every day | ORAL | Status: DC

## 2015-03-08 NOTE — Patient Instructions (Signed)
Your physician recommends that you schedule a follow-up appointment in: 1 Week with Jacolyn Reedy, PA-C  Your physician recommends that you schedule a follow-up appointment with Dr. Purvis Sheffield.  Your physician has requested that you have an echocardiogram. Echocardiography is a painless test that uses sound waves to create images of your heart. It provides your doctor with information about the size and shape of your heart and how well your heart's chambers and valves are working. This procedure takes approximately one hour. There are no restrictions for this procedure.  Your physician recommends that you have lab work done today.  Your physician has recommended you make the following change in your medication:   Stop Taking Hydrochlorothiazide   Start Taking Lasix 40 mg and Potassium 20 mg Daily  If you need a refill on your cardiac medications before your next appointment, please call your pharmacy.  Thank you for choosing [facility]!

## 2015-03-08 NOTE — Assessment & Plan Note (Signed)
Denies any chest pain at this time but does have new symptoms of CHF. We'll continue to monitor closely.

## 2015-03-08 NOTE — Assessment & Plan Note (Signed)
Patient has a systolic murmur we'll check an echo to rule out AS

## 2015-03-08 NOTE — Progress Notes (Signed)
Cardiology Office Note   Date:  03/08/2015   ID:  [name], DOB Jan 09, 1921, MRN [id number]  PCP:  Ashley Gully, MD  Cardiologist:  Dr. Purvis Sheffield Chief Complaint: Shortness of breath    History of Present Illness: [name] is a 82 y.o. female who presents for referral from Dr. Felecia [name] for worsening shortness of breath. She has a history of CAD with cath in 2012 demonstrating a 20-25% proximal LAD, 65-70% mid focal LAD, 30-40% distal LAD, first RCA with mid vessel 15-20% stenosed and normal systolic function. She also has history of CHB status post PPM insertion, hypertension and dyslipidemia. She was last seen in 10/2014 at which time she was having some shortness of breath and fatigue but wanted to continue ongoing medical treatment when talking about further cardiac testing such as stress testing and/or coronary angiography.   Patient comes in today accompanied by her daughter. She continues to live alone but has 7 children checking on her regularly. Over the past month she has had worsening dyspnea on exertion with little activity. Her heart rate seem to get up yesterday with movement as well. She does not feel this. She only feels short of breath. She denies any chest tightness or orthopnea. Her weight is up 11 pounds since last office visit. She's had swelling in her legs and feels tight around her abdomen.    Past Medical History  Diagnosis Date  . DJD (degenerative joint disease)   . Osteoporosis   . Anxiety   . Sleep apnea   . Seasonal allergies   . Anemia   . Hypertension     Past Surgical History  Procedure Laterality Date  . Pacemaker insertion    . Hemorrhoid surgery    . Cholecystectomy    . Total hip arthroplasty       Current Outpatient Prescriptions  Medication Sig Dispense Refill  . acetaminophen (TYLENOL) 650 MG CR tablet Take 650 mg by mouth as needed.      Marland Kitchen amLODipine (NORVASC) 5 MG tablet Take 1 tablet (5 mg total) by mouth daily. 90 tablet 3  .  aspirin 81 MG tablet Take 81 mg by mouth daily.      Marland Kitchen atenolol (TENORMIN) 25 MG tablet TAKE 1 TABLET BY MOUTH TWICE DAILY 180 tablet 0  . calcium-vitamin D (OSCAL WITH D) 500-200 MG-UNIT per tablet Take 1 tablet by mouth daily.      . ferrous sulfate 325 (65 FE) MG EC tablet Take 65 mg by mouth 3 (three) times daily with meals.    . hydrochlorothiazide (HYDRODIURIL) 25 MG tablet Take 25 mg by mouth daily.    Marland Kitchen lisinopril (PRINIVIL,ZESTRIL) 40 MG tablet Take 1 tablet (40 mg total) by mouth daily. 90 tablet 0  . pravastatin (PRAVACHOL) 40 MG tablet Take 40 mg by mouth daily.     No current facility-administered medications for this visit.    Allergies:   Review of patient's allergies indicates no known allergies.    Social History:  The patient  reports that she has never smoked. She does not have any smokeless tobacco history on file. She reports that she does not drink alcohol or use illicit drugs.   Family History:  The patient's family history includes Stomach cancer in her father.    ROS:  Please see the history of present illness.   Otherwise, review of systems are positive for none.   All other systems are reviewed and negative.    PHYSICAL EXAM: VS:  Ht  (1.575 m)  Wt 173 lb (78.472 kg)  BMI 31.63 kg/m2 , BMI Body mass index is 31.63 kg/(m^2). GEN: Well nourished, well developed, in no acute distress Neck: no JVD, HJR, carotid bruits, or masses Cardiac:  Irregular with 2-3/6 systolic murmur at the left sternal border, no gallop, rubs, thrill or heave,  Respiratory:   Decreased breath sounds butclear to auscultation bilaterally, normal work of breathing GI: soft, nontender, nondistended, + BS MS: no deformity or atrophy Extremities:  +1-2 edema up to her kneeswithout cyanosis, clubbing,  good distal pulses bilaterally.  Skin: warm and dry, no rash Neuro:  Strength and sensation are intact    EKG:  EKG is ordered today. The ekg ordered today demonstrates  Normal sinus  rhythm with PACs and PVCs. Reviewed with Dr. Diona Browner who concurs   Recent Labs: No results found for requested labs within last 365 days.    Lipid Panel No results found for: CHOL, TRIG, HDL, CHOLHDL, VLDL, LDLCALC, LDLDIRECT    Wt Readings from Last 3 Encounters:  03/08/15 173 lb (78.472 kg)  11/21/14 162 lb 6.4 oz (73.664 kg)  09/30/14 158 lb 9.6 oz (71.94 kg)      Other studies Reviewed: Additional studies/ records that were reviewed today include and review of the records demonstrates: Cardiac cath 2012 FINDINGS:  LV showed good LV systolic function, EF of 65-70%.  Left main was patent.  LAD has 20-25% proximal and 65-70% mid focal stenosis and 30-40% distal stenosis.   The LAD vessel is calcified.  High diagonal 1 was moderate size which has 20-30% mid stenosis.  Diagonal 2 and 3 were very small.  Left circumflex has 40-50% mid stenosis.  High OM 1 is moderate size which has mild disease.  OM 2 to OM 4 were very small.  RCA has 15-20% mid stenosis.  PDA and PLV branches were small which were patent.  The patient tolerated the procedure well.  There were no complications.  The patient was transferred to recovery room in stable condition.    ASSESSMENT AND PLAN:  Patient Active Problem List   Diagnosis Date Noted  . CHF (congestive heart failure) (HCC) 03/08/2015  . Systolic murmur 03/08/2015  . CAD (coronary artery disease) 05/25/2014  . DYSLIPIDEMIA 03/14/2010  . ESSENTIAL HYPERTENSION, BENIGN 03/20/2009  . BRADYCARDIA 03/20/2009  . Cardiac pacemaker in situ 03/20/2009    Signed, Jacolyn Reedy, PA-C  03/08/2015 1:12 PM    [facility] [address] [place], [place], [place]  [address] Phone: [phone]; Fax: [phone]

## 2015-03-08 NOTE — Assessment & Plan Note (Signed)
Patient calls in with pacer check monthly and has an appointment next month for device check. She is having  a lot of PACs and PVCs but it looks like sinus rhythm. We'll check labs to make sure potassium is stable

## 2015-03-08 NOTE — Assessment & Plan Note (Addendum)
Patient has new symptoms of heart failure with 11 pound weight gain. She's had normal LV function in the past. We'll stop HCTZ and start Lasix 40 mg once daily potassium 20 mEq daily. Check labs including TSH. Check 2-D echo for LV function and systolic murmur. 2 g sodium diet given , weight chart given. I will see her back next week in follow-up and she will see Dr.Koneswaran in 3 weeks.

## 2015-03-08 NOTE — Assessment & Plan Note (Signed)
Blood pressure stable ? ?

## 2015-03-08 NOTE — Telephone Encounter (Signed)
-----   Message from Dyann Kief, PA-C sent at 03/08/2015  3:52 PM EST ----- Potassium low. Take two 20 meq tablets today then 1 daily. Repeat bmet next week.

## 2015-03-10 ENCOUNTER — Ambulatory Visit (HOSPITAL_COMMUNITY)
Admission: RE | Admit: 2015-03-10 | Discharge: 2015-03-10 | Disposition: A | Discharge status: Home / Self Care | Payer: Medicare Other | Source: Ambulatory Visit | Attending: Physician Assistant | Admitting: Physician Assistant

## 2015-03-10 DIAGNOSIS — R011 Cardiac murmur, unspecified: Secondary | ICD-10-CM | POA: Diagnosis not present

## 2015-03-10 DIAGNOSIS — I083 Combined rheumatic disorders of mitral, aortic and tricuspid valves: Secondary | ICD-10-CM | POA: Diagnosis not present

## 2015-03-10 DIAGNOSIS — I110 Hypertensive heart disease with heart failure: Secondary | ICD-10-CM | POA: Diagnosis not present

## 2015-03-10 DIAGNOSIS — I509 Heart failure, unspecified: Secondary | ICD-10-CM | POA: Diagnosis not present

## 2015-03-10 DIAGNOSIS — I2510 Atherosclerotic heart disease of native coronary artery without angina pectoris: Secondary | ICD-10-CM | POA: Diagnosis not present

## 2015-03-10 DIAGNOSIS — E785 Hyperlipidemia, unspecified: Secondary | ICD-10-CM | POA: Diagnosis not present

## 2015-03-14 ENCOUNTER — Telehealth: Payer: Self-pay | Admitting: Physician Assistant

## 2015-03-14 ENCOUNTER — Other Ambulatory Visit (HOSPITAL_COMMUNITY)
Admission: RE | Admit: 2015-03-14 | Discharge: 2015-03-14 | Disposition: A | Discharge status: Home / Self Care | Payer: Medicare Other | Source: Ambulatory Visit | Attending: Physician Assistant | Admitting: Physician Assistant

## 2015-03-14 DIAGNOSIS — Z79899 Other long term (current) drug therapy: Secondary | ICD-10-CM | POA: Insufficient documentation

## 2015-03-14 DIAGNOSIS — Z5181 Encounter for therapeutic drug level monitoring: Secondary | ICD-10-CM | POA: Insufficient documentation

## 2015-03-14 LAB — BASIC METABOLIC PANEL
Anion gap: 8 (ref 5–15)
BUN: 19 mg/dL (ref 6–20)
CHLORIDE: 102 mmol/L (ref 101–111)
CO2: 32 mmol/L (ref 22–32)
CREATININE: 0.8 mg/dL (ref 0.44–1.00)
Calcium: 8.9 mg/dL (ref 8.9–10.3)
GFR calc Af Amer: >60 mL/min (ref >60)
GFR calc non Af Amer: >60 mL/min (ref >60)
GLUCOSE: 127 mg/dL — AB (ref 65–99)
POTASSIUM: 3.8 mmol/L (ref 3.5–5.1)
SODIUM: 142 mmol/L (ref 135–145)

## 2015-03-14 NOTE — Telephone Encounter (Signed)
New message ° ° ° ° °Returning a call to the nurse °

## 2015-03-14 NOTE — Telephone Encounter (Signed)
Returned pt call and went over her echo results. Pt was reminded of her f/u appt in the [place] office tomorrow, 03/15/15 and to arrive 15 mins early. Pt and daughter verbalized understanding.

## 2015-03-15 ENCOUNTER — Ambulatory Visit (INDEPENDENT_AMBULATORY_CARE_PROVIDER_SITE_OTHER): Payer: Medicare Other | Admitting: Physician Assistant

## 2015-03-15 ENCOUNTER — Encounter: Payer: Self-pay | Admitting: Physician Assistant

## 2015-03-15 VITALS — BP 140/70 | HR 67 | Ht 63.0 in | Wt 151.0 lb

## 2015-03-15 DIAGNOSIS — I2510 Atherosclerotic heart disease of native coronary artery without angina pectoris: Secondary | ICD-10-CM | POA: Diagnosis not present

## 2015-03-15 DIAGNOSIS — I071 Rheumatic tricuspid insufficiency: Secondary | ICD-10-CM

## 2015-03-15 DIAGNOSIS — Z79899 Other long term (current) drug therapy: Secondary | ICD-10-CM | POA: Diagnosis not present

## 2015-03-15 DIAGNOSIS — I5033 Acute on chronic diastolic (congestive) heart failure: Secondary | ICD-10-CM | POA: Diagnosis not present

## 2015-03-15 DIAGNOSIS — I10 Essential (primary) hypertension: Secondary | ICD-10-CM

## 2015-03-15 DIAGNOSIS — Z950 Presence of cardiac pacemaker: Secondary | ICD-10-CM

## 2015-03-15 NOTE — Assessment & Plan Note (Addendum)
Patient has new diagnosis of heart failure. She diuresed 18 pounds since last week. Echo shows normal LV function EF 65-70% with grade 2 DD high ventricular filling pressures and severe asymmetric septal hypertrophy. Aortic valve was severely thickened with mild to moderate stenosis and mild AI, mild MR and severe TR with moderately increased pulmonary systolic pressures of 53 mmHg. We'll continue Lasix 40 mg daily as well as potassium. They have dramatically cut back on her sodium intake which has helped. Recheck labs when she sees Dr. Purvis Sheffield 03/27/15. She is also scheduled for device check in March.

## 2015-03-15 NOTE — Assessment & Plan Note (Signed)
Stable without chest pain 

## 2015-03-15 NOTE — Assessment & Plan Note (Signed)
Blood pressure is stable 

## 2015-03-15 NOTE — Assessment & Plan Note (Signed)
Patient has severe TR with moderate leave reduced RV function. Please see echo for details

## 2015-03-15 NOTE — Assessment & Plan Note (Addendum)
Pacer check next month

## 2015-03-15 NOTE — Progress Notes (Addendum)
Cardiology Office Note   Date:  03/15/2015   ID:  Kynlie, Hyun 07/10/1920, MRN [id number]  PCP:  Avon Gully, MD  Cardiologist:  Dr. Purvis Sheffield Chief Complaint: Shortness of breath    History of Present Illness: [name] is a 82 y.o. female who is here for one-week follow-up after I saw her last week  for referral from Dr. Felecia Shelling for worsening shortness of breath. She has a history of CAD with cath in 2012 demonstrating a 20-25% proximal LAD, 65-70% mid focal LAD, 30-40% distal LAD, first RCA with mid vessel 15-20% stenosed and normal systolic function. She also has history of CHB status post PPM insertion, hypertension and dyslipidemia. She was last seen in 10/2014 at which time she was having some shortness of breath and fatigue but wanted to continue ongoing medical treatment when talking about further cardiac testing such as stress testing and/or coronary angiography.   Patient came in last week accompanied by her daughter. She continues to live alone but has 7 children checking on her regularly. Over the past month she has had worsening dyspnea on exertion with little activity. Her heart rate seem to get up yesterday with movement as well. She does not feel this. She only feels short of breath. She denies any chest tightness or orthopnea. Her weight is up 11 pounds since last office visit. She's had swelling in her legs and feels tight around her abdomen. I  I stopped her HCTZ and start Lasix 40 mg daily and potassium 20 mEq daily. 2-D echo showed EF to be 65-70% with grade 2 DD and Doppler parameters consistent with increased ventricular filling pressures. There was severe asymmetric septal hypertrophy and appears to be some subvalvular increase the flow velocity without significant gradient at rest. Aortic valve is severely thickened with mild to moderate stenosis mean gradient of 11 mmHg, valve area mean 1.28 cm, mild MR moderate leave reduced RV function and severe TR with PA  pressure of 53 mmHg. Her potassium was low at 3.4 renal function normal. I repeated her labs and her potassium was up to 3.8. And renal function remained stable.  Patient is here today accompanied by  Her 2 daughters. She has lost 18 pounds. She still gets short of breath with little activity and is quite weak. I reviewed her echo results and labs in detail with her and her family.    Past Medical History  Diagnosis Date  . DJD (degenerative joint disease)   . Osteoporosis   . Anxiety   . Sleep apnea   . Seasonal allergies   . Anemia   . Hypertension     Past Surgical History  Procedure Laterality Date  . Pacemaker insertion    . Hemorrhoid surgery    . Cholecystectomy    . Total hip arthroplasty       Current Outpatient Prescriptions  Medication Sig Dispense Refill  . acetaminophen (TYLENOL) 650 MG CR tablet Take 650 mg by mouth as needed.      Marland Kitchen amLODipine (NORVASC) 5 MG tablet Take 1 tablet (5 mg total) by mouth daily. 90 tablet 3  . aspirin 81 MG tablet Take 81 mg by mouth daily.      Marland Kitchen atenolol (TENORMIN) 25 MG tablet TAKE 1 TABLET BY MOUTH TWICE DAILY 180 tablet 0  . calcium-vitamin D (OSCAL WITH D) 500-200 MG-UNIT per tablet Take 1 tablet by mouth daily.      . ferrous sulfate 325 (65 FE) MG EC tablet Take  65 mg by mouth 3 (three) times daily with meals.    . furosemide (LASIX) 40 MG tablet Take 1 tablet (40 mg total) by mouth daily. 90 tablet 3  . lisinopril (PRINIVIL,ZESTRIL) 40 MG tablet Take 1 tablet (40 mg total) by mouth daily. 90 tablet 0  . potassium chloride SA (K-DUR,KLOR-CON) 20 MEQ tablet Take 1 tablet (20 mEq total) by mouth daily. 90 tablet 3  . pravastatin (PRAVACHOL) 40 MG tablet Take 40 mg by mouth daily.     No current facility-administered medications for this visit.    Allergies:   Review of patient's allergies indicates no known allergies.    Social History:  The patient  reports that she has never smoked. She does not have any smokeless  tobacco history on file. She reports that she does not drink alcohol or use illicit drugs.   Family History:  The patient's family history includes Stomach cancer in her father.    ROS:  Please see the history of present illness.   Otherwise, review of systems are positive for none.   All other systems are reviewed and negative.    PHYSICAL EXAM: VS:  BP 140/70 mmHg  Pulse 67  Ht  (1.6 m)  Wt 151 lb (68.493 kg)  BMI 26.76 kg/m2  SpO2 99% , BMI Body mass index is 26.76 kg/(m^2). GEN: Well nourished, well developed, in no acute distress Neck: Increased JVD, HJR, no carotid bruits, or masses Cardiac:  Irregular with 2-3/6 systolic murmur at the left sternal border, no gallop, rubs, thrill or heave,  Respiratory:   Decreased breath sounds butclear to auscultation bilaterally, normal work of breathing GI: soft, nontender, nondistended, + BS MS: no deformity or atrophy Extremities:  +1 edema up to her kneeswithout cyanosis, clubbing,  good distal pulses bilaterally.  Skin: warm and dry, no rash Neuro:  Strength and sensation are intact    EKG:  EKG is not ordered today.  Recent Labs: 03/08/2015: ALT 38; Hemoglobin 13.4; Platelets 131*; TSH 3.302 03/14/2015: BUN 19; Creatinine, Ser 0.80; Potassium 3.8; Sodium 142    Lipid Panel No results found for: CHOL, TRIG, HDL, CHOLHDL, VLDL, LDLCALC, LDLDIRECT    Wt Readings from Last 3 Encounters:  03/15/15 151 lb (68.493 kg)  03/08/15 173 lb (78.472 kg)  11/21/14 162 lb 6.4 oz (73.664 kg)      Other studies Reviewed: Additional studies/ records that were reviewed today include and review of the records demonstrates: Cardiac cath 2012 FINDINGS:  LV showed good LV systolic function, EF of 65-70%.  Left main was patent.  LAD has 20-25% proximal and 65-70% mid focal stenosis and 30-40% distal stenosis.   The LAD vessel is calcified.  High diagonal 1 was moderate size which has 20-30% mid stenosis.  Diagonal 2 and 3 were very  small.  Left circumflex has 40-50% mid stenosis.  High OM 1 is moderate size which has mild disease.  OM 2 to OM 4 were very small.  RCA has 15-20% mid stenosis.  PDA and PLV branches were small which were patent.  The patient tolerated the procedure well.  There were no complications.  The patient was transferred to recovery room in stable condition.  2-D echo 03/10/15 Study Conclusions  - Left ventricle: The cavity size was normal. Systolic function was   vigorous. The estimated ejection fraction was in the range of 65%   to 70%. Wall motion was normal; there were no regional wall   motion abnormalities. Features are  consistent with a pseudonormal   left ventricular filling pattern, with concomitant abnormal   relaxation and increased filling pressure (grade 2 diastolic   dysfunction). Doppler parameters are consistent with high   ventricular filling pressure. Severe asymmetric septal   hypertrophy. There appears to be some subvalvular increase in   flow velocity without a significant gradient at rest. Valsalva   gradients were not measured. - Aortic valve: Moderately calcified annulus. Severely thickened   leaflets. There was mild to moderate stenosis. There was mild   regurgitation. Mean gradient (S): 11 mm Hg. Valve area (VTI):   1.07 cm^2. Valve area (Vmax): 1.18 cm^2. Valve area (Vmean): 1.28   cm^2. - Mitral valve: Mildly to moderately calcified annulus. Severely   thickened leaflets . There was mild regurgitation. - Left atrium: The atrium was severely dilated. - Right ventricle: The cavity size was mildly dilated. Systolic   function was moderately reduced. - Right atrium: The atrium was severely dilated. - Tricuspid valve: There was severe regurgitation. There is   systolic hepatic flow reversal consistent with severe TR. - Pulmonary arteries: Systolic pressure was moderately increased.   PA peak pressure: 53 mm Hg (S). - Technically adequate study.    ASSESSMENT AND  PLAN:  CHF (congestive heart failure) (HCC) Patient has new diagnosis of heart failure. She diuresed 18 pounds since last week. Echo shows normal LV function EF 65-70% with grade 2 DD high ventricular filling pressures and severe asymmetric septal hypertrophy. Aortic valve was severely thickened with mild to moderate stenosis and mild AI, mild MR and severe TR with moderately increased pulmonary systolic pressures of 53 mmHg. We'll continue Lasix 40 mg daily as well as potassium. They have dramatically cut back on her sodium intake which has helped. Recheck labs when she sees Dr. Purvis Sheffield 03/27/15. She is also scheduled for device check in March.  ESSENTIAL HYPERTENSION, BENIGN Blood pressure is stable  CAD (coronary artery disease) Stable without chest pain  Severe tricuspid regurgitation Patient has severe TR with moderate leave reduced RV function. Please see echo for details  Cardiac pacemaker in situ Pacer check next month    Signed, Ashley Reedy, PA-C  03/15/2015 2:42 PM    [facility] [address] [place], [place], [place]  [address] Phone: [phone]; Fax: [phone]

## 2015-03-15 NOTE — Patient Instructions (Signed)
Your physician recommends that you keep your follow-up appointment with Dr. Purvis Sheffield.   Call for weight gain of more than 2-3 pounds over night.   Your physician recommends that you return for lab work in: On the day of your appt with Dr. Purvis Sheffield  If you need a refill on your cardiac medications before your next appointment, please call your pharmacy.  Thank you for choosing [facility]!

## 2015-03-27 ENCOUNTER — Other Ambulatory Visit: Payer: Self-pay

## 2015-03-27 ENCOUNTER — Encounter: Payer: Self-pay | Admitting: Cardiovascular Disease

## 2015-03-27 ENCOUNTER — Ambulatory Visit (INDEPENDENT_AMBULATORY_CARE_PROVIDER_SITE_OTHER): Payer: Medicare Other | Admitting: Cardiovascular Disease

## 2015-03-27 ENCOUNTER — Other Ambulatory Visit (HOSPITAL_COMMUNITY)
Admission: RE | Admit: 2015-03-27 | Discharge: 2015-03-27 | Disposition: A | Discharge status: Home / Self Care | Payer: Medicare Other | Source: Ambulatory Visit | Attending: Physician Assistant | Admitting: Physician Assistant

## 2015-03-27 ENCOUNTER — Telehealth: Payer: Self-pay

## 2015-03-27 VITALS — BP 128/78 | HR 74 | Ht 63.0 in | Wt 152.0 lb

## 2015-03-27 DIAGNOSIS — Z79899 Other long term (current) drug therapy: Secondary | ICD-10-CM | POA: Diagnosis not present

## 2015-03-27 DIAGNOSIS — R0602 Shortness of breath: Secondary | ICD-10-CM

## 2015-03-27 DIAGNOSIS — R531 Weakness: Secondary | ICD-10-CM

## 2015-03-27 DIAGNOSIS — Z5181 Encounter for therapeutic drug level monitoring: Secondary | ICD-10-CM | POA: Diagnosis not present

## 2015-03-27 DIAGNOSIS — I2510 Atherosclerotic heart disease of native coronary artery without angina pectoris: Secondary | ICD-10-CM | POA: Diagnosis not present

## 2015-03-27 DIAGNOSIS — I071 Rheumatic tricuspid insufficiency: Secondary | ICD-10-CM

## 2015-03-27 DIAGNOSIS — I5033 Acute on chronic diastolic (congestive) heart failure: Secondary | ICD-10-CM | POA: Diagnosis not present

## 2015-03-27 DIAGNOSIS — I25118 Atherosclerotic heart disease of native coronary artery with other forms of angina pectoris: Secondary | ICD-10-CM

## 2015-03-27 DIAGNOSIS — I10 Essential (primary) hypertension: Secondary | ICD-10-CM

## 2015-03-27 DIAGNOSIS — R5383 Other fatigue: Secondary | ICD-10-CM | POA: Diagnosis not present

## 2015-03-27 DIAGNOSIS — Z950 Presence of cardiac pacemaker: Secondary | ICD-10-CM

## 2015-03-27 LAB — BASIC METABOLIC PANEL
ANION GAP: 8 (ref 5–15)
BUN: 20 mg/dL (ref 6–20)
CHLORIDE: 106 mmol/L (ref 101–111)
CO2: 28 mmol/L (ref 22–32)
CREATININE: 1.01 mg/dL — AB (ref 0.44–1.00)
Calcium: 9.4 mg/dL (ref 8.9–10.3)
GFR calc non Af Amer: 47 mL/min — ABNORMAL LOW (ref >60)
GFR, EST AFRICAN AMERICAN: 54 mL/min — AB (ref >60)
Glucose, Bld: 131 mg/dL — ABNORMAL HIGH (ref 65–99)
POTASSIUM: 3.8 mmol/L (ref 3.5–5.1)
SODIUM: 142 mmol/L (ref 135–145)

## 2015-03-27 NOTE — Telephone Encounter (Signed)
Spoke to pt, informed her of her lab results and let her know to repeat labs in 4 weeks. I mailed her the lab slips with the reminder date of around 4/3 on it to repeat labs.

## 2015-03-27 NOTE — Patient Instructions (Signed)
Medication Instructions:  Your physician recommends that you continue on your current medications as directed. Please refer to the Current Medication list given to you today.   Labwork: none  Testing/Procedures: Your physician has requested that you have a lexiscan myoview. For further information please visit [email or web address]. Please follow instruction sheet, as given.    Follow-Up: Your physician recommends that you schedule a follow-up appointment in: 6 weeks    Any Other Special Instructions Will Be Listed Below (If Applicable).     If you need a refill on your cardiac medications before your next appointment, please call your pharmacy.   

## 2015-03-27 NOTE — Progress Notes (Signed)
Patient ID: [name], female   DOB: [birth year], 82 y.o.   MRN: [id number]      SUBJECTIVE: The patient returns for routine follow-up. She has a history of coronary artery disease and underwent coronary angiography in October 2012 which demonstrated 20-25% proximal LAD stenosis, 65-70% mid focal LAD stenosis, 30-40% distal LAD stenosis, calcified LAD, first diagonal branch was moderate size was 20-30% mid vessel stenosis, 40-50% mid left circumflex coronary artery stenosis, and mild disease in the RCA with mid vessel 15-20% stenosis, and normal left ventricular systolic function. She also has a history of symptomatic bradycardia has a pacemaker and is followed by Dr. [name].  She has been having worsening exertional dyspnea and saw Leda Gauze PA-C on 2/22.  She underwent echocardiography and was diagnosed with acute on chronic diastolic heart failure.  Echocardiogram on 03/10/15 demonstrated vigorous left ventricular systolic function, EF 65-70%, grade 2 diastolic dysfunction with elevated filling pressures, severe asymmetric septal hypertrophy with some subvalvular disease with no significant gradient at rest, mild-to-moderate aortic stenosis with mild aortic regurgitation, mild mitral regurgitation , moderately reduced right ventricular systolic function, and severe tricuspid regurgitation with moderately increased pulmonary pressures, 53 m mercury.  Wt 152 lbs (173 on 2/15).  While she denies chest pain, she continues to feel fatigued with minimal exertion. She said if there is a blockage in her coronary arteries, she would want something done. This is a change in her conversation since her last visit with me. Her daughter also notices a marked decline in energy levels.   Review of Systems: As per "subjective", otherwise negative.  No Known Allergies  Current Outpatient Prescriptions  Medication Sig Dispense Refill  . acetaminophen (TYLENOL) 650 MG CR tablet Take 650 mg by mouth as needed.       Marland Kitchen amLODipine (NORVASC) 5 MG tablet Take 1 tablet (5 mg total) by mouth daily. 90 tablet 3  . aspirin 81 MG tablet Take 81 mg by mouth daily.      Marland Kitchen atenolol (TENORMIN) 25 MG tablet TAKE 1 TABLET BY MOUTH TWICE DAILY 180 tablet 0  . calcium-vitamin D (OSCAL WITH D) 500-200 MG-UNIT per tablet Take 1 tablet by mouth daily.      . ferrous sulfate 325 (65 FE) MG EC tablet Take 65 mg by mouth 3 (three) times daily with meals.    . furosemide (LASIX) 40 MG tablet Take 1 tablet (40 mg total) by mouth daily. 90 tablet 3  . lisinopril (PRINIVIL,ZESTRIL) 40 MG tablet Take 1 tablet (40 mg total) by mouth daily. 90 tablet 0  . potassium chloride SA (K-DUR,KLOR-CON) 20 MEQ tablet Take 1 tablet (20 mEq total) by mouth daily. 90 tablet 3  . pravastatin (PRAVACHOL) 40 MG tablet Take 40 mg by mouth daily.     No current facility-administered medications for this visit.    Past Medical History  Diagnosis Date  . DJD (degenerative joint disease)   . Osteoporosis   . Anxiety   . Sleep apnea   . Seasonal allergies   . Anemia   . Hypertension     Past Surgical History  Procedure Laterality Date  . Pacemaker insertion    . Hemorrhoid surgery    . Cholecystectomy    . Total hip arthroplasty      Social History   Social History  . Marital Status: Widowed    Spouse Name: N/A  . Number of Children: N/A  . Years of Education: N/A   Occupational History  .  Not on file.   Social History Main Topics  . Smoking status: Never Smoker   . Smokeless tobacco: Not on file  . Alcohol Use: No  . Drug Use: No  . Sexual Activity: Not on file   Other Topics Concern  . Not on file   Social History Narrative   Widowed   Daughter     Filed Vitals:   03/27/15 1415  BP: 128/78  Pulse: 74  Height: 5\' 3"  (1.6 m)  Weight: 152 lb (68.947 kg)  SpO2: 95%    PHYSICAL EXAM General: NAD HEENT: Normal. Neck: No JVD, no thyromegaly. Lungs: Clear to auscultation bilaterally with normal respiratory  effort. CV: Nondisplaced PMI. Regular rate and rhythm, normal S1/S2, no S3/S4, 2/6 systolic murmur loudest over RUSB, but heard along left sternal border as well. Trivial pretibial and periankle edema. Wearing compression stockings. Abdomen: Soft, nontender, no distention.  Neurologic: Alert and oriented.  Psych: Normal affect. Skin: Normal. Musculoskeletal: No gross deformities. Extremities: No clubbing or cyanosis.   ECG: Most recent ECG reviewed.      ASSESSMENT AND PLAN: 1. CAD with progressive exertional dyspnea and fatigue: Given cath findings noted above, and after a lengthy conversation with the patient and her daughter, I will proceed with a Lexiscan Cardiolite stress test. No changes to therapy which includes ASA, atenolol,  and statin.  2. Symptomatic bradycardia s/p pacemaker: Device interrogation on 09/30/14 demonstrated normal device function. Follows with Dr. [name].  3. Essential HTN: Controlled. No changes.  4. Chronic diastolic heart failure: Euvolemic on Lasix 40 mg daily. No changes.  5. Aortic stenosis: Mild to moderate. Will monitor.  Dispo: f/u 6 weeks.  Prentice Docker, M.D., F.A.C.C.

## 2015-03-28 ENCOUNTER — Telehealth: Payer: Self-pay | Admitting: Cardiovascular Disease

## 2015-03-28 NOTE — Telephone Encounter (Signed)
Pt's daughter Cheryl Flash would like someone to call her concerning her mothers lab results, the pt did not remember what they were

## 2015-03-28 NOTE — Telephone Encounter (Signed)
Pt's daughter given lab results,understands to repeat BMET in 1 month

## 2015-04-11 ENCOUNTER — Telehealth: Payer: Self-pay | Admitting: Cardiovascular Disease

## 2015-04-11 NOTE — Telephone Encounter (Signed)
pls call pt's daughter and let her know if pt can take her meds tomorrow before her test

## 2015-04-11 NOTE — Telephone Encounter (Signed)
Lexiscan,no need to hold meds except daughter elects to hold lasix for 82 yo mother

## 2015-04-12 ENCOUNTER — Encounter (HOSPITAL_COMMUNITY)
Admission: RE | Admit: 2015-04-12 | Discharge: 2015-04-12 | Disposition: A | Discharge status: Home / Self Care | Payer: Medicare Other | Source: Ambulatory Visit | Attending: Cardiovascular Disease | Admitting: Cardiovascular Disease

## 2015-04-12 ENCOUNTER — Encounter (HOSPITAL_COMMUNITY): Payer: Self-pay

## 2015-04-12 ENCOUNTER — Inpatient Hospital Stay (HOSPITAL_COMMUNITY): Admission: RE | Admit: 2015-04-12 | Payer: Medicare Other | Source: Ambulatory Visit

## 2015-04-12 DIAGNOSIS — I519 Heart disease, unspecified: Secondary | ICD-10-CM | POA: Diagnosis not present

## 2015-04-12 DIAGNOSIS — R5383 Other fatigue: Secondary | ICD-10-CM | POA: Insufficient documentation

## 2015-04-12 DIAGNOSIS — R0602 Shortness of breath: Secondary | ICD-10-CM | POA: Diagnosis not present

## 2015-04-12 LAB — NM MYOCAR MULTI W/SPECT W/WALL MOTION / EF
CHL CUP NUCLEAR SDS: 4
CHL CUP NUCLEAR SRS: 20
LV dias vol: 41 mL (ref 46–106)
LV sys vol: 21 mL
NUC STRESS TID: 1.04
Peak HR: 72 {beats}/min
RATE: 0.32
Rest HR: 63 {beats}/min
SSS: 24

## 2015-04-12 MED ORDER — TECHNETIUM TC 99M SESTAMIBI - CARDIOLITE
30.0000 | Freq: Once | INTRAVENOUS | Status: AC | PRN
  Administered 2015-04-12: 12:00:00 30 via INTRAVENOUS

## 2015-04-12 MED ORDER — TECHNETIUM TC 99M SESTAMIBI GENERIC - CARDIOLITE
10.0000 | Freq: Once | INTRAVENOUS | Status: AC | PRN
  Administered 2015-04-12: 10 via INTRAVENOUS

## 2015-04-12 MED ORDER — SODIUM CHLORIDE 0.9% FLUSH
INTRAVENOUS | Status: AC
  Administered 2015-04-12: 10 mL via INTRAVENOUS
  Filled 2015-04-12: qty 10

## 2015-04-12 MED ORDER — REGADENOSON 0.4 MG/5ML IV SOLN
INTRAVENOUS | Status: AC
  Administered 2015-04-12: 0.4 mg via INTRAVENOUS
  Filled 2015-04-12: qty 5

## 2015-04-18 ENCOUNTER — Other Ambulatory Visit (HOSPITAL_COMMUNITY)
Admission: RE | Admit: 2015-04-18 | Discharge: 2015-04-18 | Disposition: A | Discharge status: Home / Self Care | Payer: Medicare Other | Source: Ambulatory Visit | Attending: Physician Assistant | Admitting: Physician Assistant

## 2015-04-18 ENCOUNTER — Encounter: Payer: Self-pay | Admitting: Physician Assistant

## 2015-04-18 ENCOUNTER — Ambulatory Visit (INDEPENDENT_AMBULATORY_CARE_PROVIDER_SITE_OTHER): Payer: Medicare Other | Admitting: Physician Assistant

## 2015-04-18 VITALS — BP 142/82 | HR 77 | Ht 63.0 in | Wt 165.0 lb

## 2015-04-18 DIAGNOSIS — Z950 Presence of cardiac pacemaker: Secondary | ICD-10-CM | POA: Insufficient documentation

## 2015-04-18 DIAGNOSIS — I5033 Acute on chronic diastolic (congestive) heart failure: Secondary | ICD-10-CM

## 2015-04-18 DIAGNOSIS — I2510 Atherosclerotic heart disease of native coronary artery without angina pectoris: Secondary | ICD-10-CM | POA: Diagnosis not present

## 2015-04-18 DIAGNOSIS — Z79899 Other long term (current) drug therapy: Secondary | ICD-10-CM | POA: Insufficient documentation

## 2015-04-18 DIAGNOSIS — R0602 Shortness of breath: Secondary | ICD-10-CM | POA: Diagnosis not present

## 2015-04-18 LAB — BASIC METABOLIC PANEL
Anion gap: 7 (ref 5–15)
BUN: 20 mg/dL (ref 6–20)
CALCIUM: 9.1 mg/dL (ref 8.9–10.3)
CO2: 27 mmol/L (ref 22–32)
CREATININE: 0.88 mg/dL (ref 0.44–1.00)
Chloride: 106 mmol/L (ref 101–111)
GFR calc non Af Amer: 55 mL/min — ABNORMAL LOW (ref >60)
Glucose, Bld: 102 mg/dL — ABNORMAL HIGH (ref 65–99)
Potassium: 4.1 mmol/L (ref 3.5–5.1)
SODIUM: 140 mmol/L (ref 135–145)

## 2015-04-18 NOTE — Progress Notes (Signed)
Cardiology Office Note   Date:  04/18/2015   ID:  Angles, Trevizo [birth year], MRN [id number]  PCP:  Rosita Fire, MD  Cardiologist:  Dr. Bronson Ing Chief Complaint: Shortness of breath    History of Present Illness: [name] is a 82 y.o. female who who I saw 03/15/15 for follow-up of congestive heart failure. I had stopped her HCTZ and started Lasix 40 mg daily. 2-D echo showed an EF to be 65-70% with grade 2 DD an increased ventricular filling pressures. She has severe asymmetric septal hypertrophy with mild to moderate aortic stenosis and mild MR. She had severe TR and PA pressure of 53 mmHg. She had lost 18 pounds but was still getting short of breath with little activity and was very weak. She does has CAD as listed below and when she saw Dr.Koneswaran back he ordered a nuclear stress test. This was done on 04/12/15 and was a low risk study with marked breast attenuation, no ischemia EF 49% but paced with ventricular bigeminy so not likely accurate and EF was vigorous by echo. They recommended pacer interrogation since she is ventricular pacing with a short PR with ventricular echo beats.   She has a history of CAD with cath in 2012 demonstrating a 20-25% proximal LAD, 65-70% mid focal LAD, 30-40% distal LAD, first RCA with mid vessel 15-20% stenosed and normal systolic function. She also has history of CHB status post PPM insertion, hypertension and dyslipidemia. She was last seen in 10/2014 at which time she was having some shortness of breath and fatigue but wanted to continue ongoing medical treatment when talking about further cardiac testing such as stress testing and/or coronary angiography.  Patient comes in today accompanied by Her 2 daughters. Her shortness of breath has progressively worsened. She is sleeping most of the afternoon away because she is so fatigued. She has gained 13 pounds since her last office visit. When I first saw her she was 173 pounds and we got her down to 151  pounds. She is a limited salt from her diet. She diuresed initially with Lasix but doesn't seem to be working at this point.     Past Medical History  Diagnosis Date  . DJD (degenerative joint disease)   . Osteoporosis   . Anxiety   . Sleep apnea   . Seasonal allergies   . Anemia   . Hypertension     Past Surgical History  Procedure Laterality Date  . Pacemaker insertion    . Hemorrhoid surgery    . Cholecystectomy    . Total hip arthroplasty       Current Outpatient Prescriptions  Medication Sig Dispense Refill  . acetaminophen (TYLENOL) 650 MG CR tablet Take 650 mg by mouth as needed.      Marland Kitchen amLODipine (NORVASC) 5 MG tablet Take 1 tablet (5 mg total) by mouth daily. 90 tablet 3  . aspirin 81 MG tablet Take 81 mg by mouth daily.      Marland Kitchen atenolol (TENORMIN) 25 MG tablet TAKE 1 TABLET BY MOUTH TWICE DAILY 180 tablet 0  . calcium-vitamin D (OSCAL WITH D) 500-200 MG-UNIT per tablet Take 1 tablet by mouth daily.      . ferrous sulfate 325 (65 FE) MG EC tablet Take 65 mg by mouth 3 (three) times daily with meals.    . furosemide (LASIX) 40 MG tablet Take 1 tablet (40 mg total) by mouth daily. 90 tablet 3  . lisinopril (PRINIVIL,ZESTRIL) 40 MG tablet Take 1  tablet (40 mg total) by mouth daily. 90 tablet 0  . potassium chloride SA (K-DUR,KLOR-CON) 20 MEQ tablet Take 1 tablet (20 mEq total) by mouth daily. 90 tablet 3  . pravastatin (PRAVACHOL) 40 MG tablet Take 40 mg by mouth daily.     No current facility-administered medications for this visit.    Allergies:   Review of patient's allergies indicates no known allergies.    Social History:  The patient  reports that she has never smoked. She does not have any smokeless tobacco history on file. She reports that she does not drink alcohol or use illicit drugs.   Family History:  The patient's family history includes Stomach cancer in her father.    ROS:  Please see the history of present illness.   Otherwise, review of systems  are positive for none.   All other systems are reviewed and negative.    PHYSICAL EXAM: VS:  BP 142/82 mmHg  Pulse 77  Ht '5\' 3"'$  (1.6 m)  Wt 165 lb (74.844 kg)  BMI 29.24 kg/m2  SpO2 99% , BMI Body mass index is 29.24 kg/(m^2). GEN: Well nourished, elderly, in no acute distress Neck: Increased JVD, HJR, no carotid bruits, or masses Cardiac:  Irregular with 6-7/1 systolic murmur at the left sternal border, no gallop, rubs, thrill or heave,  Respiratory:   Decreased breath sounds butclear to auscultation bilaterally, normal work of breathing GI: soft, nontender, nondistended, + BS MS: no deformity or atrophy Extremities:  +1-2 edema up to her kneeswithout cyanosis, clubbing,  good distal pulses bilaterally.  Skin: warm and dry, no rash Neuro:  Strength and sensation are intact    EKG:  EKG is not ordered today.   prior EKG Normal sinus rhythm with PACs and PVCs. Reviewed with Dr. Domenic Polite who concurs   Recent Labs: 03/08/2015: ALT 38; Hemoglobin 13.4; Platelets 131*; TSH 3.302 03/27/2015: BUN 20; Creatinine, Ser 1.01*; Potassium 3.8; Sodium 142    Lipid Panel No results found for: CHOL, TRIG, HDL, CHOLHDL, VLDL, LDLCALC, LDLDIRECT    Wt Readings from Last 3 Encounters:  04/18/15 165 lb (74.844 kg)  03/27/15 152 lb (68.947 kg)  03/15/15 151 lb (68.493 kg)      Other studies Reviewed: Additional studies/ records that were reviewed today include and review of the records demonstrates:  Nuclear stress test 04/12/15  There was no ST segment deviation noted during stress.  This is a low risk study.  The left ventricular ejection fraction is mildly decreased (45-54%).   Marked breast attenuation with no ischemia.  EF calculated at 49% but paced with ventricular bigeminy so not likely accurate and EF vigorous by recent echo   Consider pacer interrogation since he is v pacing with short PR with ventricular echo beats   Study Conclusions  - Left ventricle: The cavity size was  normal. Systolic function was   vigorous. The estimated ejection fraction was in the range of 65%   to 70%. Wall motion was normal; there were no regional wall   motion abnormalities. Features are consistent with a pseudonormal   left ventricular filling pattern, with concomitant abnormal   relaxation and increased filling pressure (grade 2 diastolic   dysfunction). Doppler parameters are consistent with high   ventricular filling pressure. Severe asymmetric septal   hypertrophy. There appears to be some subvalvular increase in   flow velocity without a significant gradient at rest. Valsalva   gradients were not measured. - Aortic valve: Moderately calcified annulus. Severely thickened  leaflets. There was mild to moderate stenosis. There was mild   regurgitation. Mean gradient (S): 11 mm Hg. Valve area (VTI):   1.07 cm^2. Valve area (Vmax): 1.18 cm^2. Valve area (Vmean): 1.28   cm^2. - Mitral valve: Mildly to moderately calcified annulus. Severely   thickened leaflets . There was mild regurgitation. - Left atrium: The atrium was severely dilated. - Right ventricle: The cavity size was mildly dilated. Systolic   function was moderately reduced. - Right atrium: The atrium was severely dilated. - Tricuspid valve: There was severe regurgitation. There is   systolic hepatic flow reversal consistent with severe TR. - Pulmonary arteries: Systolic pressure was moderately increased.   PA peak pressure: 53 mm Hg (S). - Technically adequate study.   Cardiac cath 2012 FINDINGS:  LV showed good LV systolic function, EF of 75-64%.  Left main was patent.  LAD has 20-25% proximal and 65-70% mid focal stenosis and 30-40% distal stenosis.   The LAD vessel is calcified.  High diagonal 1 was moderate size which has 20-30% mid stenosis.  Diagonal 2 and 3 were very small.  Left circumflex has 40-50% mid stenosis.  High OM 1 is moderate size which has mild disease.  OM 2 to OM 4 were very small.  RCA  has 15-20% mid stenosis.  PDA and PLV branches were small which were patent.  The patient tolerated the procedure well.  There were no complications.  The patient was transferred to recovery room in stable condition.    ASSESSMENT AND PLAN: CHF (congestive heart failure) ([place]) Patient has recurrent acute on chronic diastolic heart failure. Weight is up 13 pounds despite Lasix. Will increase Lasix to 80 mg daily for the next 3 days increase potassium to 40 mEq daily. She is scheduled  on Thursday for device check. She will see Dr. Bronson Ing that day.I'm wondering if she has some underlying arrhythmia or that her pacemaker can be adjusted to help with her recent heart failure. Recent nuclear stress test showed no ischemia and LV function is vigorous on 2-D echo but she does have decreased systolic function of the right ventricle and severe TR with increased pulmonary pressures.. Check be met today  CAD (coronary artery disease) Stable without chest pain and recent Myoview negative for ischemia  Cardiac pacemaker in situ Patient for pacer check on Thursday. Hopefully this can be adjusted to help her heart failure     Signed, Ermalinda Barrios, PA-C  04/18/2015 3:08 PM    [facility] [address], [place], [place]  [address] Phone: [phone]; Fax: [phone]

## 2015-04-18 NOTE — Assessment & Plan Note (Addendum)
Patient has recurrent acute on chronic diastolic heart failure. Weight is up 13 pounds despite Lasix. Will increase Lasix to 80 mg daily for the next 3 days increase potassium to 40 mEq daily. She is scheduled  on Thursday for device check. She will see Dr. Bronson Ing that day.I'm wondering if she has some underlying arrhythmia or that her pacemaker can be adjusted to help with her recent heart failure. Recent nuclear stress test showed no ischemia and LV function is vigorous on 2-D echo but she does have decreased systolic function of the right ventricle and severe TR with increased pulmonary pressures.. Check be met today

## 2015-04-18 NOTE — Assessment & Plan Note (Signed)
Patient for pacer check on Thursday. Hopefully this can be adjusted to help her heart failure

## 2015-04-18 NOTE — Patient Instructions (Addendum)
Your physician recommends that you schedule a follow-up appointment with Dr. Purvis Sheffield on Thursday.  Your physician has recommended you make the following change in your medication:   Increase Lasix to 80 mg ( 2 Tablets) for the next 3 Days.   Increase Potassium to 40 mEq (2 Tablets) for the next 3 Days.  Your physician recommends that you have lab work done today.  If you need a refill on your cardiac medications before your next appointment, please call your pharmacy.  Thank you for choosing [facility]!

## 2015-04-18 NOTE — Assessment & Plan Note (Signed)
Stable without chest pain and recent Myoview negative for ischemia

## 2015-04-19 ENCOUNTER — Telehealth: Payer: Self-pay | Admitting: *Deleted

## 2015-04-19 NOTE — Telephone Encounter (Signed)
-----   Message from Dyann Kief, PA-C sent at 04/19/2015 12:32 PM EDT ----- Labs stable

## 2015-04-19 NOTE — Telephone Encounter (Signed)
Called patient with test results. No answer. Left message to call back.  

## 2015-04-20 ENCOUNTER — Encounter: Payer: Self-pay | Admitting: Cardiovascular Disease

## 2015-04-20 ENCOUNTER — Ambulatory Visit (INDEPENDENT_AMBULATORY_CARE_PROVIDER_SITE_OTHER): Payer: Medicare Other | Admitting: Cardiovascular Disease

## 2015-04-20 ENCOUNTER — Encounter: Payer: Self-pay | Admitting: Internal Medicine

## 2015-04-20 ENCOUNTER — Ambulatory Visit (INDEPENDENT_AMBULATORY_CARE_PROVIDER_SITE_OTHER): Payer: Medicare Other | Admitting: *Deleted

## 2015-04-20 VITALS — BP 156/78 | HR 52 | Ht 63.0 in | Wt 160.0 lb

## 2015-04-20 DIAGNOSIS — I5033 Acute on chronic diastolic (congestive) heart failure: Secondary | ICD-10-CM | POA: Diagnosis not present

## 2015-04-20 DIAGNOSIS — R5383 Other fatigue: Secondary | ICD-10-CM | POA: Diagnosis not present

## 2015-04-20 DIAGNOSIS — I071 Rheumatic tricuspid insufficiency: Secondary | ICD-10-CM

## 2015-04-20 DIAGNOSIS — R0602 Shortness of breath: Secondary | ICD-10-CM

## 2015-04-20 DIAGNOSIS — I499 Cardiac arrhythmia, unspecified: Secondary | ICD-10-CM

## 2015-04-20 DIAGNOSIS — I25118 Atherosclerotic heart disease of native coronary artery with other forms of angina pectoris: Secondary | ICD-10-CM

## 2015-04-20 DIAGNOSIS — I2510 Atherosclerotic heart disease of native coronary artery without angina pectoris: Secondary | ICD-10-CM | POA: Diagnosis not present

## 2015-04-20 DIAGNOSIS — I10 Essential (primary) hypertension: Secondary | ICD-10-CM | POA: Diagnosis not present

## 2015-04-20 DIAGNOSIS — I442 Atrioventricular block, complete: Secondary | ICD-10-CM

## 2015-04-20 DIAGNOSIS — Z950 Presence of cardiac pacemaker: Secondary | ICD-10-CM

## 2015-04-20 DIAGNOSIS — Z79899 Other long term (current) drug therapy: Secondary | ICD-10-CM

## 2015-04-20 NOTE — Patient Instructions (Signed)
Your physician recommends that you schedule a follow-up appointment on Wednesday with Jacolyn Reedy, PA- C   Your physician has recommended you make the following change in your medication:    Continue to take Lasix 80 mg Daily through Monday. You may resume your normal dose of 40 mg on Tuesday.   Continue to take Potassium 40 meq Daily through Monday. You may resume your normal dose of 20 meq on Tuesday.   If you need a refill on your cardiac medications before your next appointment, please call your pharmacy.  Thank you for choosing [facility]!

## 2015-04-20 NOTE — Progress Notes (Addendum)
Patient ID: [name], female   DOB: 02-Aug-1920, 82 y.o.   MRN: [id number]      SUBJECTIVE: The patient returns for follow up after undergoing a nuclear stress test for progressive exertional dyspnea and fatigue. Stress test was low risk. However, she has had a marked weight gain and developed decompensated diastolic heart failure. Lasix was increased to 80 mg daily on 3/28 by Leda Gauze PA-C. Dr. Eden Emms did mention considering a pacemaker interrogation on stress test interpretation.  Echocardiogram on 03/10/15 demonstrated vigorous left ventricular systolic function, EF 65-70%, grade 2 diastolic dysfunction with elevated filling pressures, severe asymmetric septal hypertrophy with some subvalvular disease with no significant gradient at rest, mild-to-moderate aortic stenosis with mild aortic regurgitation, mild mitral regurgitation , moderately reduced right ventricular systolic function, and severe tricuspid regurgitation with moderately increased pulmonary pressures, 53 mmHg.  Wt 160 lbs  (165 lbs 3/28, 152 3/6).  Her leg swelling has improved. She remains short of breath. She is due to have her pacemaker interrogated later today. Her daughters stopped weighing her daily but have gone back to doing so.   Review of Systems: As per "subjective", otherwise negative.  No Known Allergies  Current Outpatient Prescriptions  Medication Sig Dispense Refill  . acetaminophen (TYLENOL) 650 MG CR tablet Take 650 mg by mouth as needed.      Marland Kitchen amLODipine (NORVASC) 5 MG tablet Take 1 tablet (5 mg total) by mouth daily. 90 tablet 3  . aspirin 81 MG tablet Take 81 mg by mouth daily.      Marland Kitchen atenolol (TENORMIN) 25 MG tablet TAKE 1 TABLET BY MOUTH TWICE DAILY 180 tablet 0  . calcium-vitamin D (OSCAL WITH D) 500-200 MG-UNIT per tablet Take 1 tablet by mouth daily.      . ferrous sulfate 325 (65 FE) MG EC tablet Take 65 mg by mouth 3 (three) times daily with meals.    . furosemide (LASIX) 40 MG tablet Take 1  tablet (40 mg total) by mouth daily. 90 tablet 3  . lisinopril (PRINIVIL,ZESTRIL) 40 MG tablet Take 1 tablet (40 mg total) by mouth daily. 90 tablet 0  . potassium chloride SA (K-DUR,KLOR-CON) 20 MEQ tablet Take 1 tablet (20 mEq total) by mouth daily. 90 tablet 3  . pravastatin (PRAVACHOL) 40 MG tablet Take 40 mg by mouth daily.     No current facility-administered medications for this visit.    Past Medical History  Diagnosis Date  . DJD (degenerative joint disease)   . Osteoporosis   . Anxiety   . Sleep apnea   . Seasonal allergies   . Anemia   . Hypertension     Past Surgical History  Procedure Laterality Date  . Pacemaker insertion    . Hemorrhoid surgery    . Cholecystectomy    . Total hip arthroplasty      Social History   Social History  . Marital Status: Widowed    Spouse Name: N/A  . Number of Children: N/A  . Years of Education: N/A   Occupational History  . Not on file.   Social History Main Topics  . Smoking status: Never Smoker   . Smokeless tobacco: Not on file  . Alcohol Use: No  . Drug Use: No  . Sexual Activity: Not on file   Other Topics Concern  . Not on file   Social History Narrative   Widowed   Daughter     Filed Vitals:   04/20/15 1307  BP: 156/78  Pulse: 52  Height:  (1.6 m)  Weight: 160 lb (72.576 kg)  SpO2: 99%    PHYSICAL EXAM General: NAD HEENT: Normal. Neck: No JVD, no thyromegaly. Lungs: Clear to auscultation bilaterally with normal respiratory effort. CV: Nondisplaced PMI. Regular rate and rhythm with premature contractions, normal S1/S2, no S3/S4, 2/6 systolic murmur loudest over RUSB, but heard along left sternal border as well. 1+pretibial and periankle edema. Wearing compression stockings. Abdomen: Soft, nontender, no distention.  Neurologic: Alert and oriented.  Psych: Normal affect.  ECG: Most recent ECG reviewed.      ASSESSMENT AND PLAN: 1. CAD with progressive exertional dyspnea and  fatigue: Low risk Lexiscan Cardiolite stress test. No changes to therapy which includes ASA, atenolol, and statin.  2. Symptomatic bradycardia s/p pacemaker: Device interrogation on 09/30/14 demonstrated normal device function. Follows with Dr. [name]. Due to have it interrogated again today given worsening symptoms and heart failure.  3. Essential HTN: Elevated. Given acute diastolic heart failure, would consider increasing amlodipine to 7.5 mg daily at next visit if it remains elevated (would not increase to 10 mg immediately given old age and frailty).  4. Acute on chronic diastolic heart failure: 5 lbs weight loss on Lasix 80 mg daily. Will continue with KCl supplementation for four more days (has done so for two days thus far), and then resume 40 mg daily. Recommend daily weights. If wt goes up by 3 lbs within 24 hours, can take extra 40 mg.  5. Aortic stenosis: Mild to moderate. Will monitor.  Dispo: f/u next week with Leda Gauze PA-C.   Prentice Docker, M.D., F.A.C.C.  ADDENDUM: Device technician alerted me that there appeared to be a 7.5 hour episode of atrial fibrillation, with an atrial rate > 400 bpm. This may be the explanation for her symptoms. Will forward to Dr. [name] to obtain his opinion.

## 2015-04-21 LAB — CUP PACEART INCLINIC DEVICE CHECK
Battery Impedance: 3033 Ohm
Brady Statistic AP VP Percent: 69.2 %
Brady Statistic AP VS Percent: 1.4 %
Brady Statistic AS VS Percent: 1 %
Implantable Lead Implant Date: 20060926
Implantable Lead Location: 753859
Implantable Lead Model: 5076
Lead Channel Impedance Value: 432 Ohm
Lead Channel Impedance Value: 465 Ohm
Lead Channel Pacing Threshold Pulse Width: 0.4 ms
Lead Channel Pacing Threshold Pulse Width: 0.4 ms
Lead Channel Sensing Intrinsic Amplitude: 11.2 mV
Lead Channel Sensing Intrinsic Amplitude: 2.8 mV
Lead Channel Setting Pacing Amplitude: 2 V
MDC IDC LEAD IMPLANT DT: 20060926
MDC IDC LEAD LOCATION: 753860
MDC IDC MSMT BATTERY REMAINING LONGEVITY: 16 mo
MDC IDC MSMT BATTERY VOLTAGE: 2.73 V
MDC IDC MSMT LEADCHNL RA PACING THRESHOLD AMPLITUDE: 1 V
MDC IDC MSMT LEADCHNL RV PACING THRESHOLD AMPLITUDE: 1 V
MDC IDC SESS DTM: 20170331094305
MDC IDC SET LEADCHNL RV PACING AMPLITUDE: 2.5 V
MDC IDC SET LEADCHNL RV PACING PULSEWIDTH: 0.4 ms
MDC IDC SET LEADCHNL RV SENSING SENSITIVITY: 2.8 mV
MDC IDC STAT BRADY AS VP PERCENT: 28.4 %

## 2015-04-21 NOTE — Progress Notes (Signed)
Pacemaker check in clinic. Normal device function. Thresholds, sensing, impedances consistent with previous measurements. Device programmed to maximize longevity. 0.4% AF burden + ASA 81--max dur. 7hrs 26 mins, Max A >400--Dr.Koneswaran notified and recommends that pt follow up with [facility]. (2) high ventricular rates noted--max dur. 2 sec. Device programmed at appropriate safety margins. Histogram distribution appropriate for patient activity level. Device programmed to optimize intrinsic conduction. Estimated longevity 16 months (range: <2-29 months). Patient will follow up with [facility] in 3 months

## 2015-04-26 ENCOUNTER — Ambulatory Visit (INDEPENDENT_AMBULATORY_CARE_PROVIDER_SITE_OTHER): Payer: Medicare Other | Admitting: Physician Assistant

## 2015-04-26 ENCOUNTER — Encounter: Payer: Self-pay | Admitting: Physician Assistant

## 2015-04-26 DIAGNOSIS — I2510 Atherosclerotic heart disease of native coronary artery without angina pectoris: Secondary | ICD-10-CM

## 2015-04-26 DIAGNOSIS — I480 Paroxysmal atrial fibrillation: Secondary | ICD-10-CM

## 2015-04-26 DIAGNOSIS — I4891 Unspecified atrial fibrillation: Secondary | ICD-10-CM | POA: Insufficient documentation

## 2015-04-26 MED ORDER — ATENOLOL 25 MG PO TABS: ORAL_TABLET | ORAL | Status: DC

## 2015-04-26 MED ORDER — RIVAROXABAN 15 MG PO TABS: 15.0000 mg | ORAL_TABLET | Freq: Every day | ORAL | Status: DC

## 2015-04-26 NOTE — Progress Notes (Signed)
Cardiology Office Note   Date:  04/26/2015   ID:  LINETTE GUNDERSON, DOB 10-21-1920, MRN [id number]  PCP:  Avon Gully, MD  Cardiologist:  Dr. Purvis Sheffield Chief Complaint: Shortness of breath    History of Present Illness: [name] is a 82 y.o. female who who I saw 03/15/15 for follow-up of congestive heart failure. I had stopped her HCTZ and started Lasix 40 mg daily. 2-D echo showed an EF to be 65-70% with grade 2 DD an increased ventricular filling pressures. She has severe asymmetric septal hypertrophy with mild to moderate aortic stenosis and mild MR. She had severe TR and PA pressure of 53 mmHg. She had lost 18 pounds but was still getting short of breath with little activity and was very weak. She does has CAD as listed below and when she saw Dr.Koneswaran back he ordered a nuclear stress test. This was done on 04/12/15 and was a low risk study with marked breast attenuation, no ischemia EF 49% but paced with ventricular bigeminy so not likely accurate and EF was vigorous by echo. They recommended pacer interrogation since she is ventricular pacing with a short PR with ventricular echo beats.   She has a history of CAD with cath in 2012 demonstrating a 20-25% proximal LAD, 65-70% mid focal LAD, 30-40% distal LAD, first RCA with mid vessel 15-20% stenosed and normal systolic function. She also has history of CHB status post PPM insertion, hypertension and dyslipidemia. She was last seen in 10/2014 at which time she was having some shortness of breath and fatigue but wanted to continue ongoing medical treatment when talking about further cardiac testing such as stress testing and/or coronary angiography.  I saw her 04/18/15 and her shortness of breath had progressively worsened. She is sleeping most of the afternoon away because she is so fatigued. She had gained 13 pounds since her last office visit. When I first saw her she was 173 pounds and we got her down to 151 pounds. She has eliminated salt  from her diet. She diuresed initially with Lasix but doesn't seem to be working at this point.   I increased her Lasix to 80 mg daily for 3 days. She saw Dr.Koneswaran 04/20/15 at which point she had lost 5 pounds. She had device check that day and had 7-1/2 hours of atrial fibrillation with an atrial rate greater than 400 bpm. The device was programmed at appropriate safety margins to optimize intrinsic conduction. She continued Lasix 80 mg through the weekend. Yesterday was her first day of 40 mg daily and she gained 2 pounds overnight on her scales. She still continues to feel poorly. She has severe fatigue and gets short of breath with very little activity. Her weight is down to 156 pounds today.      Past Medical History  Diagnosis Date  . DJD (degenerative joint disease)   . Osteoporosis   . Anxiety   . Sleep apnea   . Seasonal allergies   . Anemia   . Hypertension     Past Surgical History  Procedure Laterality Date  . Pacemaker insertion    . Hemorrhoid surgery    . Cholecystectomy    . Total hip arthroplasty       Current Outpatient Prescriptions  Medication Sig Dispense Refill  . acetaminophen (TYLENOL) 650 MG CR tablet Take 650 mg by mouth as needed.      Marland Kitchen amLODipine (NORVASC) 5 MG tablet Take 1 tablet (5 mg total) by mouth daily. 90  tablet 3  . aspirin 81 MG tablet Take 81 mg by mouth daily.      Marland Kitchen atenolol (TENORMIN) 25 MG tablet TAKE 1 TABLET BY MOUTH TWICE DAILY 180 tablet 0  . calcium-vitamin D (OSCAL WITH D) 500-200 MG-UNIT per tablet Take 1 tablet by mouth daily.      . ferrous sulfate 325 (65 FE) MG EC tablet Take 65 mg by mouth 3 (three) times daily with meals.    . furosemide (LASIX) 40 MG tablet Take 1 tablet (40 mg total) by mouth daily. 90 tablet 3  . lisinopril (PRINIVIL,ZESTRIL) 40 MG tablet Take 1 tablet (40 mg total) by mouth daily. 90 tablet 0  . potassium chloride SA (K-DUR,KLOR-CON) 20 MEQ tablet Take 1 tablet (20 mEq total) by mouth daily. 90  tablet 3  . pravastatin (PRAVACHOL) 40 MG tablet Take 40 mg by mouth daily.     No current facility-administered medications for this visit.    Allergies:   Review of patient's allergies indicates no known allergies.    Social History:  The patient  reports that she has never smoked. She does not have any smokeless tobacco history on file. She reports that she does not drink alcohol or use illicit drugs.   Family History:  The patient's family history includes Stomach cancer in her father.    ROS:  Please see the history of present illness.   Otherwise, review of systems are positive for none.   All other systems are reviewed and negative.    PHYSICAL EXAM: VS:  There were no vitals taken for this visit. , BMI There is no weight on file to calculate BMI. GEN: Well nourished, elderly, in no acute distress Neck: Increased JVD, HJR, no carotid bruits, or masses Cardiac:  Irregular with 2-3/6 systolic murmur at the left sternal border, no gallop, rubs, thrill or heave,  Respiratory:   Decreased breath sounds but clear to auscultation bilaterally, normal work of breathing GI: soft, nontender, nondistended, + BS MS: no deformity or atrophy Extremities:  Mild edema, much improved, no cyanosis, clubbing,  good distal pulses bilaterally.  Skin: warm and dry, no rash Neuro:  Strength and sensation are intact    EKG:  EKG is ordered today.   Paced rhythm with PVCs and PACs   Recent Labs: 03/08/2015: ALT 38; Hemoglobin 13.4; Platelets 131*; TSH 3.302 04/18/2015: BUN 20; Creatinine, Ser 0.88; Potassium 4.1; Sodium 140    Lipid Panel No results found for: CHOL, TRIG, HDL, CHOLHDL, VLDL, LDLCALC, LDLDIRECT    Wt Readings from Last 3 Encounters:  04/20/15 160 lb (72.576 kg)  04/18/15 165 lb (74.844 kg)  03/27/15 152 lb (68.947 kg)      Other studies Reviewed: Additional studies/ records that were reviewed today include and review of the records demonstrates:  Nuclear stress test  04/12/15  There was no ST segment deviation noted during stress.  This is a low risk study.  The left ventricular ejection fraction is mildly decreased (45-54%).   Marked breast attenuation with no ischemia.  EF calculated at 49% but paced with ventricular bigeminy so not likely accurate and EF vigorous by recent echo   Consider pacer interrogation since he is v pacing with short PR with ventricular echo beats   Study Conclusions  - Left ventricle: The cavity size was normal. Systolic function was   vigorous. The estimated ejection fraction was in the range of 65%   to 70%. Wall motion was normal; there were no regional  wall   motion abnormalities. Features are consistent with a pseudonormal   left ventricular filling pattern, with concomitant abnormal   relaxation and increased filling pressure (grade 2 diastolic   dysfunction). Doppler parameters are consistent with high   ventricular filling pressure. Severe asymmetric septal   hypertrophy. There appears to be some subvalvular increase in   flow velocity without a significant gradient at rest. Valsalva   gradients were not measured. - Aortic valve: Moderately calcified annulus. Severely thickened   leaflets. There was mild to moderate stenosis. There was mild   regurgitation. Mean gradient (S): 11 mm Hg. Valve area (VTI):   1.07 cm^2. Valve area (Vmax): 1.18 cm^2. Valve area (Vmean): 1.28   cm^2. - Mitral valve: Mildly to moderately calcified annulus. Severely   thickened leaflets . There was mild regurgitation. - Left atrium: The atrium was severely dilated. - Right ventricle: The cavity size was mildly dilated. Systolic   function was moderately reduced. - Right atrium: The atrium was severely dilated. - Tricuspid valve: There was severe regurgitation. There is   systolic hepatic flow reversal consistent with severe TR. - Pulmonary arteries: Systolic pressure was moderately increased.   PA peak pressure: 53 mm Hg (S). -  Technically adequate study.   Cardiac cath 2012 FINDINGS:  LV showed good LV systolic function, EF of 65-70%.  Left main was patent.  LAD has 20-25% proximal and 65-70% mid focal stenosis and 30-40% distal stenosis.   The LAD vessel is calcified.  High diagonal 1 was moderate size which has 20-30% mid stenosis.  Diagonal 2 and 3 were very small.  Left circumflex has 40-50% mid stenosis.  High OM 1 is moderate size which has mild disease.  OM 2 to OM 4 were very small.  RCA has 15-20% mid stenosis.  PDA and PLV branches were small which were patent.  The patient tolerated the procedure well.  There were no complications.  The patient was transferred to recovery room in stable condition.  Pacemaker check in clinic. Normal device function. Thresholds, sensing, impedances consistent with previous measurements. Device programmed to maximize longevity. 0.4% AF burden + ASA 81--max dur. 7hrs 26 mins, Max A >400--Dr.Koneswaran notified and recommends that pt follow up with [facility]. (2) high ventricular rates noted--max dur. 2 sec. Device programmed at appropriate safety margins. Histogram distribution appropriate for patient activity level. Device programmed to optimize intrinsic conduction. Estimated longevity 16 months (range: <2-29 months). Patient will follow up with GT in 3 months         ASSESSMENT AND PLAN: Atrial fibrillation (HCC) Patient's pacemaker was interrogated and she was found to have 7.5 hours of atrial fibrillation with atrial rates greater than 400. I suspect this is what's causing her decompensated heart failure. I discussed this with Dr.Koneswaran. We will increase her atenolol to 50 mg in a.m., 25 mg in the afternoon. CHADSVASC=5. Add Xarelto 15 mg daily. . Earlier follow-up with Dr. [name] for further management.  CHF (congestive heart failure) (HCC) Patient's heart failure has been very difficult to control. I suspect it's because of underlying atrial fibrillation. She has had  fast heart rates documented by pacer interrogation with atrial rates greater than 400 bpm. Continue Lasix 40 mg daily. Can take an extra 40 mg for weight gain of 2-3 pounds overnight. Have arranged earlier follow-up with Dr. [name] for further recommendations on treatment of her atrial fibrillation. Follow-up with Dr. Kirtland Bouchard 05/05/15  CAD (coronary artery disease) Stable without chest pain. Recent nuclear stress test  without ischemia.      Elson Clan, PA-C  04/26/2015 10:48 AM    [facility] [address] [place], [place], [place]  [address] Phone: [phone]; Fax: [phone]

## 2015-04-26 NOTE — Assessment & Plan Note (Addendum)
Patient's heart failure has been very difficult to control. I suspect it's because of underlying atrial fibrillation. She has had fast heart rates documented by pacer interrogation with atrial rates greater than 400 bpm. Continue Lasix 40 mg daily. Can take an extra 40 mg for weight gain of 2-3 pounds overnight. Have arranged earlier follow-up with Dr. [name] for further recommendations on treatment of her atrial fibrillation. Follow-up with Dr. Kirtland Bouchard 05/05/15

## 2015-04-26 NOTE — Assessment & Plan Note (Addendum)
Patient's pacemaker was interrogated and she was found to have 7.5 hours of atrial fibrillation with atrial rates greater than 400. I suspect this is what's causing her decompensated heart failure. I discussed this with Dr.Koneswaran. We will increase her atenolol to 50 mg in a.m., 25 mg in the afternoon. CHADSVASC=5. Add Xarelto 15 mg daily. Patient's hemoglobin was stable in February. Will need follow-up labs when she sees Dr. Kirtland Bouchard next week. . Earlier follow-up with Dr. [name] for further management.

## 2015-04-26 NOTE — Assessment & Plan Note (Addendum)
Stable without chest pain. Recent nuclear stress test without ischemia.

## 2015-04-26 NOTE — Patient Instructions (Signed)
Your physician recommends that you schedule a follow-up appointment WITH DR. [name]   Your physician has recommended you make the following change in your medication:   Start Xarelto 15 mg Daily  Increase Atenolol to 50 mg in the Am and 25 mg in Pm.   If you need a refill on your cardiac medications before your next appointment, please call your pharmacy.  Thank you for choosing [facility]!

## 2015-05-01 ENCOUNTER — Ambulatory Visit (INDEPENDENT_AMBULATORY_CARE_PROVIDER_SITE_OTHER): Payer: Medicare Other | Admitting: Internal Medicine

## 2015-05-01 ENCOUNTER — Encounter: Payer: Self-pay | Admitting: Internal Medicine

## 2015-05-01 VITALS — BP 142/74 | HR 68 | Ht 63.0 in | Wt 156.6 lb

## 2015-05-01 DIAGNOSIS — I2510 Atherosclerotic heart disease of native coronary artery without angina pectoris: Secondary | ICD-10-CM

## 2015-05-01 DIAGNOSIS — I480 Paroxysmal atrial fibrillation: Secondary | ICD-10-CM

## 2015-05-01 MED ORDER — AMIODARONE HCL 200 MG PO TABS: 200.0000 mg | ORAL_TABLET | Freq: Every day | ORAL | Status: DC

## 2015-05-01 NOTE — Patient Instructions (Signed)
Medication Instructions:  Your physician has recommended you make the following change in your medication:  1) Stop Atenolol 2) Start Amiodarone 200 mg daily   Labwork: None ordered   Testing/Procedures: None ordered   Follow-Up: Your physician recommends that you schedule a follow-up appointment in: 3 months in [place]   Any Other Special Instructions Will Be Listed Below (If Applicable).     If you need a refill on your cardiac medications before your next appointment, please call your pharmacy.

## 2015-05-01 NOTE — Progress Notes (Signed)
HPI [name] returns today for followup. She is a peasant 38 woman with a h/o CHB, s/p PPM insertion, HTN, and dyslipidemia. In the interim, she has developed sob and PAF, but no syncope. She has edema.  She was seen in our office and placed on a beta blocker and Xarelto.   No Known Allergies   Current Outpatient Prescriptions  Medication Sig Dispense Refill  . acetaminophen (TYLENOL) 650 MG CR tablet Take 650 mg by mouth as needed.      Marland Kitchen amLODipine (NORVASC) 5 MG tablet Take 1 tablet (5 mg total) by mouth daily. 90 tablet 3  . aspirin 81 MG tablet Take 81 mg by mouth daily.      Marland Kitchen atenolol (TENORMIN) 25 MG tablet TAKE 50 MG (2 TABLET)  IN THE AM AND TAKE 25 MG (1 TABLET) IN THE PM 90 tablet 6  . calcium-vitamin D (OSCAL WITH D) 500-200 MG-UNIT per tablet Take 1 tablet by mouth daily.      . ferrous sulfate 325 (65 FE) MG EC tablet Take 65 mg by mouth 3 (three) times daily with meals.    . furosemide (LASIX) 40 MG tablet Take 1 tablet (40 mg total) by mouth daily. 90 tablet 3  . hydrochlorothiazide (HYDRODIURIL) 25 MG tablet Take 25 mg by mouth as directed.    Marland Kitchen lisinopril (PRINIVIL,ZESTRIL) 40 MG tablet Take 1 tablet (40 mg total) by mouth daily. 90 tablet 0  . potassium chloride SA (K-DUR,KLOR-CON) 20 MEQ tablet Take 1 tablet (20 mEq total) by mouth daily. 90 tablet 3  . pravastatin (PRAVACHOL) 40 MG tablet Take 40 mg by mouth daily.    . Rivaroxaban (XARELTO) 15 MG TABS tablet Take 1 tablet (15 mg total) by mouth daily with supper. 30 tablet 6   No current facility-administered medications for this visit.     Past Medical History  Diagnosis Date  . DJD (degenerative joint disease)   . Osteoporosis   . Anxiety   . Sleep apnea   . Seasonal allergies   . Anemia   . Hypertension     ROS:   All systems reviewed and negative except as noted in the HPI.   Past Surgical History  Procedure Laterality Date  . Pacemaker insertion    . Hemorrhoid surgery    . Cholecystectomy     . Total hip arthroplasty       Family History  Problem Relation Age of Onset  . Stomach cancer Father      Social History   Social History  . Marital Status: Widowed    Spouse Name: N/A  . Number of Children: N/A  . Years of Education: N/A   Occupational History  . Not on file.   Social History Main Topics  . Smoking status: Never Smoker   . Smokeless tobacco: Not on file  . Alcohol Use: No  . Drug Use: No  . Sexual Activity: Not on file   Other Topics Concern  . Not on file   Social History Narrative   Widowed   Daughter     BP 142/74 mmHg  Pulse 68  Ht  (1.6 m)  Wt 156 lb 9.6 oz (71.033 kg)  BMI 27.75 kg/m2  SpO2 99%  Physical Exam:  Well appearing elderly woman, who looks younger than her stated age and in NAD HEENT: Unremarkable Neck:  6 cm JVD, no thyromegally Back:  No CVA tenderness Lungs:  Clear with no wheezes, rales, or rhonchi. Well healed  PPM insertion. HEART:  Regular rate rhythm, no murmurs, no rubs, no clicks Abd:  soft, positive bowel sounds, no organomegally, no rebound, no guarding Ext:  2 plus pulses, no edema, no cyanosis, no clubbing Skin:  No rashes no nodules Neuro:  CN II through XII intact, motor grossly intact  ECG - nsr with AV pacing and PVC's in a bigeminal distribution.   DEVICE  Normal device function.  See PaceArt for details.   Assess/Plan: 1. PAF - she is in rhythm today but still feels weak. Will add amiodarone to treat along with #2. 2. PVC's she has bigeminal PVC's and will add amiodarone. 3. HTN  - her blood pressure is reasonably well controlled. Will follow. 4. Chronic diastolic heart failure - her symptoms are class 3. Hopefully she will fee better when she is not having as many PVC's.  [name].D.

## 2015-05-05 ENCOUNTER — Other Ambulatory Visit (HOSPITAL_COMMUNITY)
Admission: RE | Admit: 2015-05-05 | Discharge: 2015-05-05 | Disposition: A | Discharge status: Home / Self Care | Payer: Medicare Other | Source: Ambulatory Visit | Attending: Cardiovascular Disease | Admitting: Cardiovascular Disease

## 2015-05-05 ENCOUNTER — Ambulatory Visit (INDEPENDENT_AMBULATORY_CARE_PROVIDER_SITE_OTHER): Payer: Medicare Other | Admitting: Cardiovascular Disease

## 2015-05-05 ENCOUNTER — Encounter: Payer: Self-pay | Admitting: Cardiovascular Disease

## 2015-05-05 VITALS — BP 122/60 | HR 109 | Ht 63.0 in | Wt 154.8 lb

## 2015-05-05 DIAGNOSIS — Z950 Presence of cardiac pacemaker: Secondary | ICD-10-CM

## 2015-05-05 DIAGNOSIS — I480 Paroxysmal atrial fibrillation: Secondary | ICD-10-CM | POA: Diagnosis not present

## 2015-05-05 DIAGNOSIS — R0602 Shortness of breath: Secondary | ICD-10-CM | POA: Diagnosis not present

## 2015-05-05 DIAGNOSIS — Z79899 Other long term (current) drug therapy: Secondary | ICD-10-CM

## 2015-05-05 DIAGNOSIS — I2510 Atherosclerotic heart disease of native coronary artery without angina pectoris: Secondary | ICD-10-CM | POA: Diagnosis not present

## 2015-05-05 DIAGNOSIS — I5033 Acute on chronic diastolic (congestive) heart failure: Secondary | ICD-10-CM

## 2015-05-05 DIAGNOSIS — I350 Nonrheumatic aortic (valve) stenosis: Secondary | ICD-10-CM

## 2015-05-05 DIAGNOSIS — I10 Essential (primary) hypertension: Secondary | ICD-10-CM

## 2015-05-05 LAB — BASIC METABOLIC PANEL
Anion gap: 11 (ref 5–15)
BUN: 33 mg/dL — AB (ref 6–20)
CALCIUM: 9.2 mg/dL (ref 8.9–10.3)
CO2: 24 mmol/L (ref 22–32)
CREATININE: 1.09 mg/dL — AB (ref 0.44–1.00)
Chloride: 107 mmol/L (ref 101–111)
GFR calc Af Amer: 49 mL/min — ABNORMAL LOW (ref >60)
GFR calc non Af Amer: 43 mL/min — ABNORMAL LOW (ref >60)
GLUCOSE: 104 mg/dL — AB (ref 65–99)
Potassium: 3.6 mmol/L (ref 3.5–5.1)
Sodium: 142 mmol/L (ref 135–145)

## 2015-05-05 NOTE — Progress Notes (Signed)
Patient ID: [name], female   DOB: [birth year], 82 y.o.   MRN: [id number]      SUBJECTIVE: The patient presents for follow-up of acute on chronic diastolic heart failure. She recently saw Dr. [name] who also started amiodarone. She has atrial fibrillation (discovered by pacemaker interrogation) and bigeminal PVCs. She is anticoagulated now with Xarelto.  Wt 154 lbs (156 lbs on 4/10).  Echocardiogram on 03/10/15 demonstrated vigorous left ventricular systolic function, EF 65-70%, grade 2 diastolic dysfunction with elevated filling pressures, severe asymmetric septal hypertrophy with some subvalvular disease with no significant gradient at rest, mild-to-moderate aortic stenosis with mild aortic regurgitation, mild mitral regurgitation , moderately reduced right ventricular systolic function, and severe tricuspid regurgitation with moderately increased pulmonary pressures, 53 mmHg.  She feels her ankle and leg swelling is about the same. Her daughters gave her 60 mg Lasix yesterday as she feels worn out after 80 mg.  Review of Systems: As per "subjective", otherwise negative.  No Known Allergies  Current Outpatient Prescriptions  Medication Sig Dispense Refill  . acetaminophen (TYLENOL) 650 MG CR tablet Take 650 mg by mouth as needed.      Marland Kitchen amiodarone (PACERONE) 200 MG tablet Take 1 tablet (200 mg total) by mouth daily. 90 tablet 3  . amLODipine (NORVASC) 5 MG tablet Take 1 tablet (5 mg total) by mouth daily. 90 tablet 3  . aspirin 81 MG tablet Take 81 mg by mouth daily.      . calcium-vitamin D (OSCAL WITH D) 500-200 MG-UNIT per tablet Take 1 tablet by mouth daily.      . ferrous sulfate 325 (65 FE) MG EC tablet Take 65 mg by mouth 3 (three) times daily with meals.    . furosemide (LASIX) 40 MG tablet Take 1 tablet (40 mg total) by mouth daily. 90 tablet 3  . hydrochlorothiazide (HYDRODIURIL) 25 MG tablet Take 25 mg by mouth as directed.    Marland Kitchen lisinopril (PRINIVIL,ZESTRIL) 40 MG tablet  Take 1 tablet (40 mg total) by mouth daily. 90 tablet 0  . potassium chloride SA (K-DUR,KLOR-CON) 20 MEQ tablet Take 1 tablet (20 mEq total) by mouth daily. 90 tablet 3  . pravastatin (PRAVACHOL) 40 MG tablet Take 40 mg by mouth daily.    . Rivaroxaban (XARELTO) 15 MG TABS tablet Take 1 tablet (15 mg total) by mouth daily with supper. 30 tablet 6   No current facility-administered medications for this visit.    Past Medical History  Diagnosis Date  . DJD (degenerative joint disease)   . Osteoporosis   . Anxiety   . Sleep apnea   . Seasonal allergies   . Anemia   . Hypertension     Past Surgical History  Procedure Laterality Date  . Pacemaker insertion    . Hemorrhoid surgery    . Cholecystectomy    . Total hip arthroplasty      Social History   Social History  . Marital Status: Widowed    Spouse Name: N/A  . Number of Children: N/A  . Years of Education: N/A   Occupational History  . Not on file.   Social History Main Topics  . Smoking status: Never Smoker   . Smokeless tobacco: Not on file  . Alcohol Use: No  . Drug Use: No  . Sexual Activity: Not on file   Other Topics Concern  . Not on file   Social History Narrative   Widowed   Daughter     Filed Vitals:  05/05/15 1312  BP: 122/60  Pulse: 109  Height: 5\' 3"  (1.6 m)  Weight: 154 lb 12.8 oz (70.217 kg)  SpO2: 94%    PHYSICAL EXAM General: NAD HEENT: Normal. Neck: No JVD, no thyromegaly. Lungs: Clear to auscultation bilaterally with normal respiratory effort. CV: Regular rate and rhythm with premature contractions, normal S1/S2, no S3/S4, 2/6 systolic murmur loudest over RUSB, but heard along left sternal border as well. Trace-1+pretibial and periankle edema. Wearing compression stockings. Abdomen: Soft, nontender, no distention.  Neurologic: Alert and oriented.  Psych: Normal affect.  ECG: Most recent ECG reviewed.      ASSESSMENT AND PLAN: 1. CAD: Low risk Lexiscan Cardiolite  stress test. No changes to therapy which includes ASA and statin. No longer on atenolol.  2. Symptomatic bradycardia s/p pacemaker: Amiodarone initiated by Dr. [name] for atrial fibrillation and bigeminal PVC's.  3. Essential HTN: Controlled. No changes.  4. Acute on chronic diastolic heart failure: 2 lbs weight loss in past 4 days. Continue Lasix 40 mg daily with an extra 20-40 mg for 3 lb weight gain within 24 hrs. Will check BMET today.  5. Aortic stenosis: Mild to moderate. Will monitor.  Dispo: f/u 1 month with Leda Gauze PA-C.   Prentice Docker, M.D., F.A.C.C.

## 2015-05-05 NOTE — Addendum Note (Signed)
Addended byAbelino Derrick R on: 05/05/2015 02:08 PM   Modules accepted: Orders

## 2015-05-05 NOTE — Patient Instructions (Signed)
Medication Instructions:  Your physician recommends that you continue on your current medications as directed. Please refer to the Current Medication list given to you today.   Labwork: Your physician recommends that you return for lab work in: TODAY  BMET    Testing/Procedures: NONE  Follow-Up: Your physician recommends that you schedule a follow-up appointment in: 1 MONTH   Any Other Special Instructions Will Be Listed Below (If Applicable).     If you need a refill on your cardiac medications before your next appointment, please call your pharmacy.

## 2015-05-09 ENCOUNTER — Telehealth: Payer: Self-pay

## 2015-05-09 NOTE — Telephone Encounter (Signed)
-----   Message from Laqueta Linden, MD sent at 05/05/2015  3:24 PM EDT ----- Markedly intravascularly depleted. Would have her drink lots of water. Could try holding Lasix for 2 days as well. Could also consider cutting back to 20 mg daily with an extra 20 mg for increased weight gain. Given her age, balancing leg swelling and dehydration will be an ongoing issue.

## 2015-05-09 NOTE — Telephone Encounter (Signed)
Called pt. Spoke to a daughter,who in return asked me to call another daughter and explain the medication change to her. Cheryl Flash)  I called [name] and explained the medication change and lab results to her. She voiced understanding. Will send a copy of labs to pt. For her daughters to review and keep for future references.

## 2015-05-09 NOTE — Telephone Encounter (Signed)
-----   Message from Suresh A Koneswaran, MD sent at 05/05/2015  3:24 PM EDT ----- Markedly intravascularly depleted. Would have her drink lots of water. Could try holding Lasix for 2 days as well. Could also consider cutting back to 20 mg daily with an extra 20 mg for increased weight gain. Given her age, balancing leg swelling and dehydration will be an ongoing issue. 

## 2015-05-14 ENCOUNTER — Emergency Department (HOSPITAL_COMMUNITY): Payer: Medicare Other

## 2015-05-14 ENCOUNTER — Inpatient Hospital Stay (HOSPITAL_COMMUNITY)
Admission: EM | Admit: 2015-05-14 | Discharge: 2015-05-19 | DRG: 378 | Disposition: A | Discharge status: Home Health | Payer: Medicare Other | Attending: Internal Medicine | Admitting: Internal Medicine

## 2015-05-14 ENCOUNTER — Encounter (HOSPITAL_COMMUNITY): Payer: Self-pay | Admitting: Emergency Medicine

## 2015-05-14 DIAGNOSIS — K3189 Other diseases of stomach and duodenum: Secondary | ICD-10-CM | POA: Diagnosis present

## 2015-05-14 DIAGNOSIS — Z7901 Long term (current) use of anticoagulants: Secondary | ICD-10-CM

## 2015-05-14 DIAGNOSIS — D649 Anemia, unspecified: Secondary | ICD-10-CM | POA: Diagnosis present

## 2015-05-14 DIAGNOSIS — I4891 Unspecified atrial fibrillation: Secondary | ICD-10-CM | POA: Diagnosis not present

## 2015-05-14 DIAGNOSIS — K922 Gastrointestinal hemorrhage, unspecified: Secondary | ICD-10-CM | POA: Diagnosis present

## 2015-05-14 DIAGNOSIS — I10 Essential (primary) hypertension: Secondary | ICD-10-CM | POA: Diagnosis present

## 2015-05-14 DIAGNOSIS — Z7982 Long term (current) use of aspirin: Secondary | ICD-10-CM

## 2015-05-14 DIAGNOSIS — D62 Acute posthemorrhagic anemia: Secondary | ICD-10-CM | POA: Diagnosis present

## 2015-05-14 DIAGNOSIS — Z800 Family history of malignant neoplasm of digestive organs: Secondary | ICD-10-CM

## 2015-05-14 DIAGNOSIS — I2510 Atherosclerotic heart disease of native coronary artery without angina pectoris: Secondary | ICD-10-CM | POA: Diagnosis not present

## 2015-05-14 DIAGNOSIS — Z23 Encounter for immunization: Secondary | ICD-10-CM | POA: Diagnosis not present

## 2015-05-14 DIAGNOSIS — K2960 Other gastritis without bleeding: Secondary | ICD-10-CM | POA: Diagnosis present

## 2015-05-14 DIAGNOSIS — Z96649 Presence of unspecified artificial hip joint: Secondary | ICD-10-CM | POA: Diagnosis present

## 2015-05-14 DIAGNOSIS — K921 Melena: Secondary | ICD-10-CM | POA: Diagnosis present

## 2015-05-14 DIAGNOSIS — I482 Chronic atrial fibrillation: Secondary | ICD-10-CM | POA: Diagnosis not present

## 2015-05-14 DIAGNOSIS — Q399 Congenital malformation of esophagus, unspecified: Secondary | ICD-10-CM | POA: Diagnosis not present

## 2015-05-14 DIAGNOSIS — R011 Cardiac murmur, unspecified: Secondary | ICD-10-CM | POA: Diagnosis not present

## 2015-05-14 DIAGNOSIS — T45515A Adverse effect of anticoagulants, initial encounter: Secondary | ICD-10-CM

## 2015-05-14 DIAGNOSIS — G4730 Sleep apnea, unspecified: Secondary | ICD-10-CM | POA: Diagnosis present

## 2015-05-14 DIAGNOSIS — J189 Pneumonia, unspecified organism: Secondary | ICD-10-CM | POA: Diagnosis not present

## 2015-05-14 DIAGNOSIS — R404 Transient alteration of awareness: Secondary | ICD-10-CM | POA: Diagnosis not present

## 2015-05-14 DIAGNOSIS — K449 Diaphragmatic hernia without obstruction or gangrene: Secondary | ICD-10-CM | POA: Diagnosis present

## 2015-05-14 DIAGNOSIS — M810 Age-related osteoporosis without current pathological fracture: Secondary | ICD-10-CM | POA: Diagnosis present

## 2015-05-14 DIAGNOSIS — R531 Weakness: Secondary | ICD-10-CM | POA: Diagnosis not present

## 2015-05-14 DIAGNOSIS — I517 Cardiomegaly: Secondary | ICD-10-CM | POA: Diagnosis not present

## 2015-05-14 HISTORY — DX: Unspecified atrial fibrillation: I48.91

## 2015-05-14 LAB — ABO/RH: ABO/RH(D): O POS

## 2015-05-14 LAB — COMPREHENSIVE METABOLIC PANEL
ALT: 15 U/L (ref 14–54)
AST: 26 U/L (ref 15–41)
Albumin: 2.9 g/dL — ABNORMAL LOW (ref 3.5–5.0)
Alkaline Phosphatase: 33 U/L — ABNORMAL LOW (ref 38–126)
Anion gap: 9 (ref 5–15)
BILIRUBIN TOTAL: 0.6 mg/dL (ref 0.3–1.2)
BUN: 37 mg/dL — AB (ref 6–20)
CHLORIDE: 114 mmol/L — AB (ref 101–111)
CO2: 20 mmol/L — ABNORMAL LOW (ref 22–32)
CREATININE: 1.21 mg/dL — AB (ref 0.44–1.00)
Calcium: 8.7 mg/dL — ABNORMAL LOW (ref 8.9–10.3)
GFR, EST AFRICAN AMERICAN: 44 mL/min — AB (ref >60)
GFR, EST NON AFRICAN AMERICAN: 38 mL/min — AB (ref >60)
Glucose, Bld: 177 mg/dL — ABNORMAL HIGH (ref 65–99)
POTASSIUM: 3.7 mmol/L (ref 3.5–5.1)
Sodium: 143 mmol/L (ref 135–145)
TOTAL PROTEIN: 5 g/dL — AB (ref 6.5–8.1)

## 2015-05-14 LAB — CBC
HEMATOCRIT: 16.1 % — AB (ref 36.0–46.0)
HEMATOCRIT: 23.3 % — AB (ref 36.0–46.0)
HEMOGLOBIN: 5.3 g/dL — AB (ref 12.0–15.0)
Hemoglobin: 8.1 g/dL — ABNORMAL LOW (ref 12.0–15.0)
MCH: 31.2 pg (ref 26.0–34.0)
MCH: 30.9 pg (ref 26.0–34.0)
MCHC: 32.9 g/dL (ref 30.0–36.0)
MCHC: 34.8 g/dL (ref 30.0–36.0)
MCV: 94.7 fL (ref 78.0–100.0)
MCV: 88.9 fL (ref 78.0–100.0)
PLATELETS: 133 10*3/uL — AB (ref 150–400)
PLATELETS: 126 10*3/uL — AB (ref 150–400)
RBC: 1.7 MIL/uL — ABNORMAL LOW (ref 3.87–5.11)
RBC: 2.62 MIL/uL — ABNORMAL LOW (ref 3.87–5.11)
RDW: 15.1 % (ref 11.5–15.5)
RDW: 14.9 % (ref 11.5–15.5)
WBC: 9 10*3/uL (ref 4.0–10.5)
WBC: 9 10*3/uL (ref 4.0–10.5)

## 2015-05-14 LAB — POC OCCULT BLOOD, ED: FECAL OCCULT BLD: POSITIVE — AB

## 2015-05-14 LAB — PREPARE RBC (CROSSMATCH)

## 2015-05-14 LAB — TROPONIN I: TROPONIN I: 0.05 ng/mL — AB (ref <0.031)

## 2015-05-14 LAB — PROTIME-INR
INR: 4.41 — ABNORMAL HIGH (ref 0.00–1.49)
PROTHROMBIN TIME: 40.9 s — AB (ref 11.6–15.2)

## 2015-05-14 MED ORDER — SODIUM CHLORIDE 0.9 % IV SOLN: 80.0000 mg | Freq: Once | INTRAVENOUS | Status: DC

## 2015-05-14 MED ORDER — PANTOPRAZOLE SODIUM 40 MG IV SOLR
40.0000 mg | Freq: Two times a day (BID) | INTRAVENOUS | Status: DC
  Administered 2015-05-14 – 2015-05-16 (×5): 40 mg via INTRAVENOUS
  Filled 2015-05-14 (×5): qty 40

## 2015-05-14 MED ORDER — SODIUM CHLORIDE 0.9 % IV SOLN: 10.0000 mL/h | Freq: Once | INTRAVENOUS | Status: DC

## 2015-05-14 MED ORDER — ONDANSETRON HCL 4 MG/2ML IJ SOLN: 4.0000 mg | Freq: Four times a day (QID) | INTRAMUSCULAR | Status: DC | PRN

## 2015-05-14 MED ORDER — PROTHROMBIN COMPLEX CONC HUMAN 500 UNITS IV KIT
50.0000 [IU]/kg | PACK | Status: AC
  Administered 2015-05-14: 3525 [IU] via INTRAVENOUS
  Filled 2015-05-14: qty 141

## 2015-05-14 MED ORDER — MORPHINE SULFATE (PF) 2 MG/ML IV SOLN
0.5000 mg | INTRAVENOUS | Status: DC | PRN
  Administered 2015-05-15: 0.5 mg via INTRAVENOUS
  Filled 2015-05-14: qty 1

## 2015-05-14 MED ORDER — SODIUM CHLORIDE 0.9 % IV SOLN
INTRAVENOUS | Status: DC
  Administered 2015-05-14: 13:00:00 via INTRAVENOUS

## 2015-05-14 MED ORDER — ONDANSETRON HCL 4 MG PO TABS: 4.0000 mg | ORAL_TABLET | Freq: Four times a day (QID) | ORAL | Status: DC | PRN

## 2015-05-14 MED ORDER — SODIUM CHLORIDE 0.9 % IV SOLN
INTRAVENOUS | Status: DC
  Administered 2015-05-15 – 2015-05-16 (×2): via INTRAVENOUS

## 2015-05-14 MED ORDER — SODIUM CHLORIDE 0.9 % IV SOLN: Freq: Once | INTRAVENOUS | Status: DC

## 2015-05-14 MED ORDER — PNEUMOCOCCAL VAC POLYVALENT 25 MCG/0.5ML IJ INJ: 0.5000 mL | INJECTION | INTRAMUSCULAR | Status: DC

## 2015-05-14 NOTE — ED Provider Notes (Addendum)
CSN: [id number]     Arrival date & time 05/14/15  1388 History  By signing my name below, I, [name], attest that this documentation has been prepared under the direction and in the presence of Cathren Laine, MD. Electronically Signed: Linus [name], ED Scribe. 05/14/2015. 8:40 AM.   Chief Complaint  Patient presents with  . Weakness   The history is provided by the patient and a relative. No language interpreter was used.   HPI Comments: [name] is a 82 y.o. female brought by EMS to the Emergency Department with a PMHx of HTN and atrial fibrillation complaining of fatigue for the past 5 days. Symptoms persistent, worsening, mod-severe, no specific exacerbating or alleviating factors. Pt also reports feeling SOB "at times." Pt lives alone but her children frequently visit her and noted changes from her baseline. Daughter states the pt is usually able to ambulate around her home with no difficulty, however lately, she has been unable to do so on her own. Pt denies any complaints of pain. Pt denies any CP, cough, congestion, N/V/D, difficulty urinating, dysuria, or any other symptoms at this time.   As per daughter, pt had blood work last week which  indicated "dehydration." Pt was on on 80 mg and then decreased to 20 mg. She was recently switched to amiodarone to better manage atrial fibrillation. Pt was recently started on amiodarone. Is on xarelto for afib.   Past Medical History  Diagnosis Date  . DJD (degenerative joint disease)   . Osteoporosis   . Anxiety   . Sleep apnea   . Seasonal allergies   . Anemia   . Hypertension   . Atrial fibrillation [facility])    Past Surgical History  Procedure Laterality Date  . Pacemaker insertion    . Hemorrhoid surgery    . Cholecystectomy    . Total hip arthroplasty     Family History  Problem Relation Age of Onset  . Stomach cancer Father    Social History  Substance Use Topics  . Smoking status: Never Smoker   . Smokeless  tobacco: None  . Alcohol Use: No   OB History    No data available     Review of Systems  Constitutional: Positive for fatigue.  HENT: Negative for congestion.   Respiratory: Positive for shortness of breath. Negative for cough.   Cardiovascular: Negative for chest pain.  Gastrointestinal: Negative for nausea, vomiting and diarrhea.  Genitourinary: Negative for dysuria and difficulty urinating.  Musculoskeletal: Negative for back pain.  Skin: Positive for pallor.  Neurological: Negative for syncope.  Hematological:       On xarelto  Psychiatric/Behavioral: Negative for confusion.  All other systems reviewed and are negative.  Allergies  Review of patient's allergies indicates no known allergies.  Home Medications   Prior to Admission medications   Medication Sig Start Date End Date Taking? Authorizing Provider  acetaminophen (TYLENOL) 650 MG CR tablet Take 650 mg by mouth as needed.      Historical Provider, MD  amiodarone (PACERONE) 200 MG tablet Take 1 tablet (200 mg total) by mouth daily. 05/01/15   Marinus Maw, MD  amLODipine (NORVASC) 5 MG tablet Take 1 tablet (5 mg total) by mouth daily. 05/25/14   Laqueta Linden, MD  aspirin 81 MG tablet Take 81 mg by mouth daily.      Historical Provider, MD  calcium-vitamin D (OSCAL WITH D) 500-200 MG-UNIT per tablet Take 1 tablet by mouth daily.  Historical Provider, MD  ferrous sulfate 325 (65 FE) MG EC tablet Take 65 mg by mouth 3 (three) times daily with meals.    Historical Provider, MD  furosemide (LASIX) 40 MG tablet Take 1 tablet (40 mg total) by mouth daily. Patient taking differently: Take 20 mg by mouth daily. Decreased to 20 mg daily. May take an extra 20 mg if leg swelling/weight gain occurs. 03/08/15   Dyann Kief, PA-C  hydrochlorothiazide (HYDRODIURIL) 25 MG tablet Take 25 mg by mouth as directed. 03/07/15   Historical Provider, MD  lisinopril (PRINIVIL,ZESTRIL) 40 MG tablet Take 1 tablet (40 mg total) by  mouth daily. 04/08/12   Marinus Maw, MD  potassium chloride SA (K-DUR,KLOR-CON) 20 MEQ tablet Take 1 tablet (20 mEq total) by mouth daily. 03/08/15   Dyann Kief, PA-C  pravastatin (PRAVACHOL) 40 MG tablet Take 40 mg by mouth daily.    Historical Provider, MD  Rivaroxaban (XARELTO) 15 MG TABS tablet Take 1 tablet (15 mg total) by mouth daily with supper. 04/26/15   Dyann Kief, PA-C   BP 137/61 mmHg  Pulse 91  Temp(Src) 97.9 F (36.6 C)  Resp 20  Ht  (1.6 m)  Wt 155 lb (70.308 kg)  BMI 27.46 kg/m2  SpO2 94%   Physical Exam  Constitutional: She is oriented to person, place, and time. She appears well-developed and well-nourished.  Weak appearing  HENT:  Head: Normocephalic and atraumatic.  Eyes: EOM are normal. No scleral icterus.  Conj pale  Neck: Normal range of motion. Neck supple.  Cardiovascular: Normal rate.   Murmur heard.  Systolic murmur is present  Pulmonary/Chest: Effort normal and breath sounds normal. No respiratory distress.  Abdominal: Soft. There is no tenderness.  Genitourinary:  Black stool/melena. Heme positive.   Musculoskeletal: Normal range of motion.  Neurological: She is alert and oriented to person, place, and time. No cranial nerve deficit.  Skin: Skin is warm and dry. No rash noted. There is pallor.  Psychiatric: She has a normal mood and affect.  Nursing note and vitals reviewed.  ED Course  Procedures  DIAGNOSTIC STUDIES: Oxygen Saturation is 94% on room air, normal by my interpretation.    COORDINATION OF CARE: 8:29 AM Will order CXR, blood work, urinalysis, and EKG. Discussed treatment plan with pt and daughter at bedside and they agreed to plan.  9:45 AM Discussed with pt and family abnormal lab finding. Pt and family are agreeable to admission for further care.    Labs Review   Results for orders placed or performed during the hospital encounter of 05/14/15  CBC  Result Value Ref Range   WBC 9.0 4.0 - 10.5 K/uL   RBC  1.70 (L) 3.87 - 5.11 MIL/uL   Hemoglobin 5.3 (LL) 12.0 - 15.0 g/dL   HCT 96.0 (L) 45.4 - 09.8 %   MCV 94.7 78.0 - 100.0 fL   MCH 31.2 26.0 - 34.0 pg   MCHC 32.9 30.0 - 36.0 g/dL   RDW 11.9 14.7 - 82.9 %   Platelets 133 (L) 150 - 400 K/uL  Comprehensive metabolic panel  Result Value Ref Range   Sodium 143 135 - 145 mmol/L   Potassium 3.7 3.5 - 5.1 mmol/L   Chloride 114 (H) 101 - 111 mmol/L   CO2 20 (L) 22 - 32 mmol/L   Glucose, Bld 177 (H) 65 - 99 mg/dL   BUN 37 (H) 6 - 20 mg/dL   Creatinine, Ser 5.62 (H) 0.44 -  1.00 mg/dL   Calcium 8.7 (L) 8.9 - 10.3 mg/dL   Total Protein 5.0 (L) 6.5 - 8.1 g/dL   Albumin 2.9 (L) 3.5 - 5.0 g/dL   AST 26 15 - 41 U/L   ALT 15 14 - 54 U/L   Alkaline Phosphatase 33 (L) 38 - 126 U/L   Total Bilirubin 0.6 0.3 - 1.2 mg/dL   GFR calc non Af Amer 38 (L) >60 mL/min   GFR calc Af Amer 44 (L) >60 mL/min   Anion gap 9 5 - 15  Troponin I  Result Value Ref Range   Troponin I 0.05 (H) <0.031 ng/mL  POC occult blood, ED Provider will collect  Result Value Ref Range   Fecal Occult Bld POSITIVE (A) NEGATIVE   Dg Chest Port 1 View  05/14/2015  CLINICAL DATA:  weakness EXAM: PORTABLE CHEST - 1 VIEW COMPARISON:  01/19/2009 FINDINGS: Stable left subclavian pacemaker. Moderate cardiomegaly. Large hiatal hernia with fluid level. Atheromatous aorta. Subsegmental atelectasis or early infiltrate at the left lung base partially obscures the left diaphragmatic leaflet. Can't exclude small left pleural effusion. No pneumothorax. Visualized skeletal structures are unremarkable. IMPRESSION: 1. Stable cardiomegaly 2. Large hiatal hernia 3. Left lower lobe patchy atelectasis versus early infiltrate. Can't exclude small left effusion. Electronically Signed   By: Corlis Leak M.D.   On: 05/14/2015 09:16       I have personally reviewed and evaluated these images and lab results as part of my medical decision-making.   EKG Interpretation   Date/Time:  Sunday May 14 2015  08:21:00 EDT Ventricular Rate:  88 PR Interval:  120 QRS Duration: 161 QT Interval:  469 QTC Calculation: 567 R Axis:   101 Text Interpretation:  Ventricular-paced complexes Confirmed by Denton Lank  MD,  Caryn Bee ([id number]) on 05/14/2015 8:25:54 AM      MDM   I personally performed the services described in this documentation, which was scribed in my presence. The recorded information has been reviewed and considered. Cathren Laine, MD  Iv ns.   Labs. Continuous pulse ox and monitor. o2 [place].    hgb returns very low, and acutely lower than prior.  hgb 5.  Melena on rectal, pt is on xarelto.  bp 90//56. 2nd iv line, ns bolus.  DOAC reversal/Kcentra iv.   Type and cross. Transfuse 2 units prbc.  protonix iv.   Hospitalists consulted for admission.  CRITICAL CARE  RE acute GI bleeding with severe anemia, requiring emergent transfusion and reversal anticoagulation Performed by: Suzi Roots Total critical care time: 35 minutes Critical care time was exclusive of separately billable procedures and treating other patients. Critical care was necessary to treat or prevent imminent or life-threatening deterioration. Critical care was time spent personally by me on the following activities: development of treatment plan with patient and/or surrogate as well as nursing, discussions with consultants, evaluation of patient's response to treatment, examination of patient, obtaining history from patient or surrogate, ordering and performing treatments and interventions, ordering and review of laboratory studies, ordering and review of radiographic studies, pulse oximetry and re-evaluation of patient's condition.  Discussed pt with Dr Ardyth Harps - she indicates place stepdown orders, Dr Felecia Shelling, she will see/admit.  Family updated on results/plan.     Cathren Laine, MD 05/14/15 1013

## 2015-05-14 NOTE — ED Notes (Signed)
CRITICAL VALUE ALERT  Critical value received:  Hgb 5.3  Date of notification:  05/14/15  Time of notification:  0944  Critical value read back:Yes.    Nurse who received alert:  Berdine Dance RN  MD notified (1st page):  Denton Lank  Time of first page:  [phone]  MD notified (2nd page):  Time of second page:  Responding MD:  Denton Lank  Time MD responded:  [phone]

## 2015-05-14 NOTE — H&P (Signed)
History and Physical    [name] FEO:[id number] DOB: December 06, 1920 DOA: 05/14/2015  Referring MD/NP/PA: Cathren Laine, MD PCP: Avon Gully, MD  Patient coming from: Home  Chief Complaint: Weakness  HPI: [name] is a 82 y.o. female with medical history significant for hypertension, atrial fibrillation on chronic anticoagulation with Xarelto who presents to the hospital with a 5 day h/o weakness. She has been having difficulty even walking to the bathroom which she can normally do without issues; in fact she lives independently despite her advanced age. She comes to the ED where she is found to have a Hb of 5.3, stool was FOBT + and was noticed to be black. We have been asked to admit her for further evaluation and management.  Review of Systems:  Constitutional: Denies fever, chills, diaphoresis, appetite change and fatigue.  HEENT: Denies photophobia, eye pain, redness, hearing loss, ear pain, congestion, sore throat, rhinorrhea, sneezing, mouth sores, trouble swallowing, neck pain, neck stiffness and tinnitus.   Respiratory: Denies SOB, DOE, cough, chest tightness,  and wheezing.   Cardiovascular: Denies chest pain, palpitations and leg swelling.  Gastrointestinal: Denies nausea, vomiting, abdominal pain, diarrhea, constipation and abdominal distention.  Genitourinary: Denies dysuria, urgency, frequency, hematuria, flank pain and difficulty urinating.  Endocrine: Denies: hot or cold intolerance, sweats, changes in hair or nails, polyuria, polydipsia. Musculoskeletal: Denies myalgias, back pain, joint swelling, arthralgias and gait problem.  Skin: Denies pallor, rash and wound.  Neurological: Denies dizziness, seizures, syncope, weakness, light-headedness, numbness and headaches.  Hematological: Denies adenopathy. Easy bruising, personal or family bleeding history  Psychiatric/Behavioral: Denies suicidal ideation, mood changes, confusion, nervousness, sleep disturbance and  agitation   Past Medical History  Diagnosis Date  . DJD (degenerative joint disease)   . Osteoporosis   . Anxiety   . Sleep apnea   . Seasonal allergies   . Anemia   . Hypertension   . Atrial fibrillation [facility])     Past Surgical History  Procedure Laterality Date  . Pacemaker insertion    . Hemorrhoid surgery    . Cholecystectomy    . Total hip arthroplasty       reports that she has never smoked. She does not have any smokeless tobacco history on file. She reports that she does not drink alcohol or use illicit drugs.  No Known Allergies  Family History  Problem Relation Age of Onset  . Stomach cancer Father     Prior to Admission medications   Medication Sig Start Date End Date Taking? Authorizing Provider  acetaminophen (TYLENOL) 650 MG CR tablet Take 650 mg by mouth every 8 (eight) hours as needed for pain.    Yes Historical Provider, MD  amiodarone (PACERONE) 200 MG tablet Take 1 tablet (200 mg total) by mouth daily. 05/01/15  Yes Marinus Maw, MD  amLODipine (NORVASC) 5 MG tablet Take 1 tablet (5 mg total) by mouth daily. 05/25/14  Yes Laqueta Linden, MD  aspirin 81 MG tablet Take 81 mg by mouth daily.     Yes Historical Provider, MD  calcium-vitamin D (OSCAL WITH D) 500-200 MG-UNIT per tablet Take 1 tablet by mouth daily.     Yes Historical Provider, MD  ferrous sulfate 325 (65 FE) MG EC tablet Take 65 mg by mouth 3 (three) times daily with meals.   Yes Historical Provider, MD  furosemide (LASIX) 40 MG tablet Take 1 tablet (40 mg total) by mouth daily. Patient taking differently: Take 20 mg by mouth daily. Decreased to  20 mg daily. May take an extra 20 mg if leg swelling/weight gain occurs. 03/08/15  Yes Dyann Kief, PA-C  hydrochlorothiazide (HYDRODIURIL) 25 MG tablet Take 25 mg by mouth daily.  03/07/15  Yes Historical Provider, MD  lisinopril (PRINIVIL,ZESTRIL) 40 MG tablet Take 1 tablet (40 mg total) by mouth daily. 04/08/12  Yes Marinus Maw, MD   potassium chloride SA (K-DUR,KLOR-CON) 20 MEQ tablet Take 1 tablet (20 mEq total) by mouth daily. 03/08/15  Yes Dyann Kief, PA-C  pravastatin (PRAVACHOL) 40 MG tablet Take 40 mg by mouth daily.   Yes Historical Provider, MD  Rivaroxaban (XARELTO) 15 MG TABS tablet Take 1 tablet (15 mg total) by mouth daily with supper. 04/26/15  Yes Dyann Kief, PA-C    Physical Exam: Filed Vitals:   05/14/15 0915 05/14/15 0930 05/14/15 1027 05/14/15 1052  BP:  90/69 102/74   Pulse: 78 77 72   Temp:    97.7 F (36.5 C)  TempSrc:    Oral  Resp: 16 19 18    Height:      Weight:      SpO2: 99% 100% 100%        Filed Vitals:   05/14/15 0915 05/14/15 0930 05/14/15 1027 05/14/15 1052  BP:  90/69 102/74   Pulse: 78 77 72   Temp:    97.7 F (36.5 C)  TempSrc:    Oral  Resp: 16 19 18    Height:      Weight:      SpO2: 99% 100% 100%    Constitutional: NAD, calm, comfortable, pale Eyes: PERRL, lids and conjunctivae normal ENMT: Mucous membranes are moist. Posterior pharynx clear of any exudate or lesions.Normal dentition.  Neck: normal, supple, no masses, no thyromegaly Respiratory: clear to auscultation bilaterally, no wheezing, no crackles. Normal respiratory effort. No accessory muscle use.  Cardiovascular: Regular rate and rhythm, no murmurs / rubs / gallops. No extremity edema. 2+ pedal pulses. No carotid bruits.  Abdomen: no tenderness, no masses palpated. No hepatosplenomegaly. Bowel sounds positive.  Musculoskeletal: no clubbing / cyanosis. No joint deformity upper and lower extremities. Good ROM, no contractures. Normal muscle tone.  Skin: no rashes, lesions, ulcers. No induration Neurologic: CN 2-12 grossly intact. Sensation intact, DTR normal. Strength 5/5 in all 4.  Psychiatric: Normal judgment and insight. Alert and oriented x 3. Normal mood.    Labs on Admission: I have personally reviewed following labs and imaging studies  CBC:  Recent Labs Lab 05/14/15 0846  WBC 9.0   HGB 5.3*  HCT 16.1*  MCV 94.7  PLT 133*   Basic Metabolic Panel:  Recent Labs Lab 05/14/15 0846  NA 143  K 3.7  CL 114*  CO2 20*  GLUCOSE 177*  BUN 37*  CREATININE 1.21*  CALCIUM 8.7*   GFR: Estimated Creatinine Clearance: 27.9 mL/min (by C-G formula based on Cr of 1.21). Liver Function Tests:  Recent Labs Lab 05/14/15 0846  AST 26  ALT 15  ALKPHOS 33*  BILITOT 0.6  PROT 5.0*  ALBUMIN 2.9*   No results for input(s): LIPASE, AMYLASE in the last 168 hours. No results for input(s): AMMONIA in the last 168 hours. Coagulation Profile:  Recent Labs Lab 05/14/15 0846  INR 4.41*   Cardiac Enzymes:  Recent Labs Lab 05/14/15 0846  TROPONINI 0.05*   BNP (last 3 results) No results for input(s): PROBNP in the last 8760 hours. HbA1C: No results for input(s): HGBA1C in the last 72 hours. CBG: No results  for input(s): GLUCAP in the last 168 hours. Lipid Profile: No results for input(s): CHOL, HDL, LDLCALC, TRIG, CHOLHDL, LDLDIRECT in the last 72 hours. Thyroid Function Tests: No results for input(s): TSH, T4TOTAL, FREET4, T3FREE, THYROIDAB in the last 72 hours. Anemia Panel: No results for input(s): VITAMINB12, FOLATE, FERRITIN, TIBC, IRON, RETICCTPCT in the last 72 hours. Urine analysis:    Component Value Date/Time   COLORURINE YELLOW 11/10/2006 1331   APPEARANCEUR CLEAR 11/10/2006 1331   LABSPEC 1.020 11/10/2006 1331   PHURINE 6.0 11/10/2006 1331   GLUCOSEU NEGATIVE 11/10/2006 1331   HGBUR TRACE* 11/10/2006 1331   BILIRUBINUR NEGATIVE 11/10/2006 1331   KETONESUR NEGATIVE 11/10/2006 1331   PROTEINUR 30* 11/10/2006 1331   UROBILINOGEN 0.2 11/10/2006 1331   NITRITE NEGATIVE 11/10/2006 1331   LEUKOCYTESUR TRACE* 11/10/2006 1331   Sepsis Labs: (procalcitonin:4,lacticidven:4) )No results found for this or any previous visit (from the past 240 hour(s)).   Radiological Exams on Admission: Dg Chest Port 1 View  05/14/2015  CLINICAL DATA:   weakness EXAM: PORTABLE CHEST - 1 VIEW COMPARISON:  01/19/2009 FINDINGS: Stable left subclavian pacemaker. Moderate cardiomegaly. Large hiatal hernia with fluid level. Atheromatous aorta. Subsegmental atelectasis or early infiltrate at the left lung base partially obscures the left diaphragmatic leaflet. Can't exclude small left pleural effusion. No pneumothorax. Visualized skeletal structures are unremarkable. IMPRESSION: 1. Stable cardiomegaly 2. Large hiatal hernia 3. Left lower lobe patchy atelectasis versus early infiltrate. Can't exclude small left effusion. Electronically Signed   By: Corlis Leak M.D.   On: 05/14/2015 09:16    EKG: Independently reviewed. V-paced rhythm  Assessment/Plan Principal Problem:   Severe anemia Active Problems:   Essential hypertension, benign   Atrial fibrillation (HCC)   Melena   Weakness generalized   GI bleed    Acute Blood Loss Anemia -Due to GI bleed. -Will transfuse 2 units of PRBCs and keep 2 more on hold. -CBC q 8 hours.  Melena -Will consult GI. -I would not exclude her from having endoscopic studies solely on basis of her age, given how physically active she still is and independent. -BID protonix.  Atrial Fibrillation -Currently rate controlled. -Will have to DC xarelto given active GI bleed.  Generalized Weakness -Due to anemia. -Request PT eval once more stable.  HTN -Hold all PO meds   DVT prophylaxis: SCD  Code Status: Full Code  Family Communication: Patient only  Disposition Plan: Admit to SDU; anticipate 2-3 days in hospital  Consults called: GI  Admission status: Inpatient    Time Spent: 95 minutes  Chaya Jan MD [facility] Pager [phone]  If 7PM-7AM, please contact night-coverage [email or web address] Password [facility]  05/14/2015, 11:59 AM

## 2015-05-14 NOTE — ED Notes (Signed)
Per EMS, pt from home c/o generalized weakness and fatigue x 5 days. Pt denies n/v/d. Pt denies CP or SOB. Family told EMS that they took pt off of her 80 mg Lasix a week ago due to dehydration. Pt AOx4. VSS.

## 2015-05-15 ENCOUNTER — Encounter (HOSPITAL_COMMUNITY): Payer: Self-pay | Admitting: Gastroenterology

## 2015-05-15 DIAGNOSIS — K922 Gastrointestinal hemorrhage, unspecified: Secondary | ICD-10-CM | POA: Insufficient documentation

## 2015-05-15 DIAGNOSIS — D649 Anemia, unspecified: Secondary | ICD-10-CM

## 2015-05-15 LAB — HEMOGLOBIN AND HEMATOCRIT, BLOOD
HCT: 22.4 % — ABNORMAL LOW (ref 36.0–46.0)
HEMATOCRIT: 17.2 % — AB (ref 36.0–46.0)
Hemoglobin: 7.8 g/dL — ABNORMAL LOW (ref 12.0–15.0)
Hemoglobin: 5.9 g/dL — CL (ref 12.0–15.0)

## 2015-05-15 LAB — CBC
HEMATOCRIT: 20.9 % — AB (ref 36.0–46.0)
HEMOGLOBIN: 7.2 g/dL — AB (ref 12.0–15.0)
MCH: 30.8 pg (ref 26.0–34.0)
MCHC: 34.4 g/dL (ref 30.0–36.0)
MCV: 89.3 fL (ref 78.0–100.0)
Platelets: 117 10*3/uL — ABNORMAL LOW (ref 150–400)
RBC: 2.34 MIL/uL — AB (ref 3.87–5.11)
RDW: 16.2 % — ABNORMAL HIGH (ref 11.5–15.5)
WBC: 7.8 10*3/uL (ref 4.0–10.5)

## 2015-05-15 LAB — PROTIME-INR
INR: 1.5 — AB (ref 0.00–1.49)
Prothrombin Time: 18.2 seconds — ABNORMAL HIGH (ref 11.6–15.2)

## 2015-05-15 LAB — BASIC METABOLIC PANEL
ANION GAP: 6 (ref 5–15)
BUN: 31 mg/dL — ABNORMAL HIGH (ref 6–20)
CALCIUM: 8.3 mg/dL — AB (ref 8.9–10.3)
CHLORIDE: 117 mmol/L — AB (ref 101–111)
CO2: 20 mmol/L — AB (ref 22–32)
Creatinine, Ser: 1.05 mg/dL — ABNORMAL HIGH (ref 0.44–1.00)
GFR calc non Af Amer: 44 mL/min — ABNORMAL LOW (ref >60)
GFR, EST AFRICAN AMERICAN: 51 mL/min — AB (ref >60)
GLUCOSE: 100 mg/dL — AB (ref 65–99)
Potassium: 3.5 mmol/L (ref 3.5–5.1)
Sodium: 143 mmol/L (ref 135–145)

## 2015-05-15 NOTE — Progress Notes (Signed)
Subjective: Patient was admitted yesterday due to severe anemia secondary to rectal bleed. Patient is on low dose aspirin and xeralto. She is being evaluated by GI.  Objective: Vital signs in last 24 hours: Temp:  [97 F (36.1 C)-98.3 F (36.8 C)] 97.3 F (36.3 C) (04/24 0400) Pulse Rate:  [35-132] 35 (04/24 0600) Resp:  [10-26] 14 (04/24 0600) BP: (80-191)/(35-121) 101/48 mmHg (04/24 0600) SpO2:  [98 %-100 %] 100 % (04/24 0600) Weight:  [69.3 kg (152 lb 12.5 oz)] 69.3 kg (152 lb 12.5 oz) (04/24 0500) Weight change:  Last BM Date: 05/14/15  Intake/Output from previous day: 04/23 0701 - 04/24 0700 In: 2051.7 [P.O.:480; I.V.:901.7; Blood:670] Out: 1251 [Urine:1250; Stool:1]  PHYSICAL EXAM General appearance: no distress and slowed mentation Resp: clear to auscultation bilaterally Cardio: regularly irregular rhythm GI: soft, non-tender; bowel sounds normal; no masses,  no organomegaly Extremities: extremities normal, atraumatic, no cyanosis or edema  Lab Results:  Results for orders placed or performed during the hospital encounter of 05/14/15 (from the past 48 hour(s))  CBC     Status: Abnormal   Collection Time: 05/14/15  8:46 AM  Result Value Ref Range   WBC 9.0 4.0 - 10.5 K/uL   RBC 1.70 (L) 3.87 - 5.11 MIL/uL   Hemoglobin 5.3 (LL) 12.0 - 15.0 g/dL    Comment: CRITICAL RESULT CALLED TO, READ BACK BY AND VERIFIED WITH: CRUISE, A AT 0943 ON 05/14/15 BY WOODS,M    HCT 16.1 (L) 36.0 - 46.0 %   MCV 94.7 78.0 - 100.0 fL   MCH 31.2 26.0 - 34.0 pg   MCHC 32.9 30.0 - 36.0 g/dL   RDW 15.1 11.5 - 15.5 %   Platelets 133 (L) 150 - 400 K/uL  Comprehensive metabolic panel     Status: Abnormal   Collection Time: 05/14/15  8:46 AM  Result Value Ref Range   Sodium 143 135 - 145 mmol/L   Potassium 3.7 3.5 - 5.1 mmol/L   Chloride 114 (H) 101 - 111 mmol/L   CO2 20 (L) 22 - 32 mmol/L   Glucose, Bld 177 (H) 65 - 99 mg/dL   BUN 37 (H) 6 - 20 mg/dL   Creatinine, Ser 1.21 (H) 0.44 -  1.00 mg/dL   Calcium 8.7 (L) 8.9 - 10.3 mg/dL   Total Protein 5.0 (L) 6.5 - 8.1 g/dL   Albumin 2.9 (L) 3.5 - 5.0 g/dL   AST 26 15 - 41 U/L   ALT 15 14 - 54 U/L   Alkaline Phosphatase 33 (L) 38 - 126 U/L   Total Bilirubin 0.6 0.3 - 1.2 mg/dL   GFR calc non Af Amer 38 (L) >60 mL/min   GFR calc Af Amer 44 (L) >60 mL/min    Comment: (NOTE) The eGFR has been calculated using the CKD EPI equation. This calculation has not been validated in all clinical situations. eGFR's persistently <60 mL/min signify possible Chronic Kidney Disease.    Anion gap 9 5 - 15  Troponin I     Status: Abnormal   Collection Time: 05/14/15  8:46 AM  Result Value Ref Range   Troponin I 0.05 (H) <0.031 ng/mL    Comment:        PERSISTENTLY INCREASED TROPONIN VALUES IN THE RANGE OF 0.04-0.49 ng/mL CAN BE SEEN IN:       -UNSTABLE ANGINA       -CONGESTIVE HEART FAILURE       -MYOCARDITIS       -CHEST TRAUMA       -  ARRYHTHMIAS       -LATE PRESENTING MYOCARDIAL INFARCTION       -COPD   CLINICAL FOLLOW-UP RECOMMENDED.   Protime-INR     Status: Abnormal   Collection Time: 05/14/15  8:46 AM  Result Value Ref Range   Prothrombin Time 40.9 (H) 11.6 - 15.2 seconds   INR 4.41 (H) 0.00 - 1.49  POC occult blood, ED Provider will collect     Status: Abnormal   Collection Time: 05/14/15  9:55 AM  Result Value Ref Range   Fecal Occult Bld POSITIVE (A) NEGATIVE  Type and screen     Status: None (Preliminary result)   Collection Time: 05/14/15 10:05 AM  Result Value Ref Range   ABO/RH(D) O POS    Antibody Screen NEG    Sample Expiration 05/17/2015    Unit Number [id number]    Blood Component Type RED CELLS,LR    Unit division 00    Status of Unit ALLOCATED    Transfusion Status OK TO TRANSFUSE    Crossmatch Result Compatible    Unit Number [id number]    Blood Component Type RED CELLS,LR    Unit division 00    Status of Unit ISSUED,FINAL    Transfusion Status OK TO TRANSFUSE    Crossmatch Result  Compatible    Unit Number [id number]    Blood Component Type RED CELLS,LR    Unit division 00    Status of Unit ALLOCATED    Transfusion Status OK TO TRANSFUSE    Crossmatch Result Compatible    Unit Number [id number]    Blood Component Type RED CELLS,LR    Unit division 00    Status of Unit ISSUED,FINAL    Transfusion Status OK TO TRANSFUSE    Crossmatch Result Compatible   Prepare RBC     Status: None   Collection Time: 05/14/15 10:05 AM  Result Value Ref Range   Order Confirmation ORDER PROCESSED BY BLOOD BANK   Prepare RBC     Status: None   Collection Time: 05/14/15 10:05 AM  Result Value Ref Range   Order Confirmation ORDER PROCESSED BY BLOOD BANK   ABO/Rh     Status: None   Collection Time: 05/14/15 10:05 AM  Result Value Ref Range   ABO/RH(D) O POS   CBC     Status: Abnormal   Collection Time: 05/14/15  6:43 PM  Result Value Ref Range   WBC 9.0 4.0 - 10.5 K/uL   RBC 2.62 (L) 3.87 - 5.11 MIL/uL   Hemoglobin 8.1 (L) 12.0 - 15.0 g/dL    Comment: POST TRANSFUSION SPECIMEN   HCT 23.3 (L) 36.0 - 46.0 %   MCV 88.9 78.0 - 100.0 fL   MCH 30.9 26.0 - 34.0 pg   MCHC 34.8 30.0 - 36.0 g/dL   RDW 14.9 11.5 - 15.5 %   Platelets 126 (L) 150 - 400 K/uL  Basic metabolic panel     Status: Abnormal   Collection Time: 05/15/15  2:29 AM  Result Value Ref Range   Sodium 143 135 - 145 mmol/L   Potassium 3.5 3.5 - 5.1 mmol/L   Chloride 117 (H) 101 - 111 mmol/L   CO2 20 (L) 22 - 32 mmol/L   Glucose, Bld 100 (H) 65 - 99 mg/dL   BUN 31 (H) 6 - 20 mg/dL   Creatinine, Ser 1.05 (H) 0.44 - 1.00 mg/dL   Calcium 8.3 (L) 8.9 - 10.3 mg/dL   GFR calc non Af Amer 44 (  L) >60 mL/min   GFR calc Af Amer 51 (L) >60 mL/min    Comment: (NOTE) The eGFR has been calculated using the CKD EPI equation. This calculation has not been validated in all clinical situations. eGFR's persistently <60 mL/min signify possible Chronic Kidney Disease.    Anion gap 6 5 - 15  CBC     Status: Abnormal    Collection Time: 05/15/15  2:29 AM  Result Value Ref Range   WBC 7.8 4.0 - 10.5 K/uL   RBC 2.34 (L) 3.87 - 5.11 MIL/uL   Hemoglobin 7.2 (L) 12.0 - 15.0 g/dL   HCT 20.9 (L) 36.0 - 46.0 %   MCV 89.3 78.0 - 100.0 fL   MCH 30.8 26.0 - 34.0 pg   MCHC 34.4 30.0 - 36.0 g/dL   RDW 16.2 (H) 11.5 - 15.5 %   Platelets 117 (L) 150 - 400 K/uL    Comment: SPECIMEN CHECKED FOR CLOTS    ABGS No results for input(s): PHART, PO2ART, TCO2, HCO3 in the last 72 hours.  Invalid input(s): PCO2 CULTURES No results found for this or any previous visit (from the past 240 hour(s)). Studies/Results: Dg Chest Port 1 View  05/14/2015  CLINICAL DATA:  weakness EXAM: PORTABLE CHEST - 1 VIEW COMPARISON:  01/19/2009 FINDINGS: Stable left subclavian pacemaker. Moderate cardiomegaly. Large hiatal hernia with fluid level. Atheromatous aorta. Subsegmental atelectasis or early infiltrate at the left lung base partially obscures the left diaphragmatic leaflet. Can't exclude small left pleural effusion. No pneumothorax. Visualized skeletal structures are unremarkable. IMPRESSION: 1. Stable cardiomegaly 2. Large hiatal hernia 3. Left lower lobe patchy atelectasis versus early infiltrate. Can't exclude small left effusion. Electronically Signed   By: Lucrezia Europe M.D.   On: 05/14/2015 09:16    Medications: I have reviewed the patient's current medications.  Assesment:   Principal Problem:   Severe anemia Active Problems:   Essential hypertension, benign   Atrial fibrillation (HCC)   Melena   Weakness generalized   GI bleed    Plan:  Medications reviewed Will monitor CBC Gi consult Continue current treatment    LOS: 1 day   [name] 05/15/2015, 8:35 AM

## 2015-05-15 NOTE — Consult Note (Signed)
Referring Provider: Dr. Ardyth Harps  Primary Care Physician:  Avon Gully, MD Primary Gastroenterologist:  Dr. Jena Gauss   Date of Admission: 05/14/15 Date of Consultation: 05/15/15  Reason for Consultation:  Acute blood loss anemia, melena   HPI:  [name] is a 82 y.o. year old female with a history of atrial fibrillation, recently started on Xarelto April 26, 2015, presenting with extreme fatigue, weakness, and found to have profound anemia, melena. Her daughter, [name], is at the bedside. [name] reports patient lives alone but the children take turns staying with her for support. Patient is fairly independent and active at baseline, but she was having difficulty getting up out of bed, extreme weakness, which prompted ED presentation. It is unclear how long she has had melena, as daughter states she does not think her mother is able to see her stool due to sitting up on an assistive device on the commode.   Admitting Hgb 5.3, receiving 2 units PRBCs with improvement to 8.1 after transfusion. This morning now 7.2. Hgb 2 months ago normal at 13.2. Last dose of Xarelto appears to have been Saturday evening. She took this once a day in the evening. Daughter states that many years ago, patient was evaluated for occult blood loss and underwent colonoscopy/EGD by Dr. Katrinka Blazing. No etiology for bleeding was found. She has had several episodes of melena overnight. No abdominal pain, nausea, or vomiting. No hematochezia. No NSAIDs. Takes a baby aspirin daily.    Past Medical History  Diagnosis Date  . DJD (degenerative joint disease)   . Osteoporosis   . Anxiety   . Sleep apnea   . Seasonal allergies   . Anemia   . Hypertension   . Atrial fibrillation [facility])     Past Surgical History  Procedure Laterality Date  . Pacemaker insertion    . Hemorrhoid surgery    . Cholecystectomy    . Total hip arthroplasty      Prior to Admission medications   Medication Sig Start Date End Date Taking? Authorizing  Provider  acetaminophen (TYLENOL) 650 MG CR tablet Take 650 mg by mouth every 8 (eight) hours as needed for pain.    Yes Historical Provider, MD  amiodarone (PACERONE) 200 MG tablet Take 1 tablet (200 mg total) by mouth daily. 05/01/15  Yes Marinus Maw, MD  amLODipine (NORVASC) 5 MG tablet Take 1 tablet (5 mg total) by mouth daily. 05/25/14  Yes Laqueta Linden, MD  aspirin 81 MG tablet Take 81 mg by mouth daily.     Yes Historical Provider, MD  calcium-vitamin D (OSCAL WITH D) 500-200 MG-UNIT per tablet Take 1 tablet by mouth daily.     Yes Historical Provider, MD  ferrous sulfate 325 (65 FE) MG EC tablet Take 65 mg by mouth 3 (three) times daily with meals.   Yes Historical Provider, MD  furosemide (LASIX) 40 MG tablet Take 1 tablet (40 mg total) by mouth daily. Patient taking differently: Take 20 mg by mouth daily. Decreased to 20 mg daily. May take an extra 20 mg if leg swelling/weight gain occurs. 03/08/15  Yes Dyann Kief, PA-C  hydrochlorothiazide (HYDRODIURIL) 25 MG tablet Take 25 mg by mouth daily.  03/07/15  Yes Historical Provider, MD  lisinopril (PRINIVIL,ZESTRIL) 40 MG tablet Take 1 tablet (40 mg total) by mouth daily. 04/08/12  Yes Marinus Maw, MD  potassium chloride SA (K-DUR,KLOR-CON) 20 MEQ tablet Take 1 tablet (20 mEq total) by mouth daily. 03/08/15  Yes Tarri Abernethy  Lenze, PA-C  pravastatin (PRAVACHOL) 40 MG tablet Take 40 mg by mouth daily.   Yes Historical Provider, MD  Rivaroxaban (XARELTO) 15 MG TABS tablet Take 1 tablet (15 mg total) by mouth daily with supper. 04/26/15  Yes Dyann Kief, PA-C    Current Facility-Administered Medications  Medication Dose Route Frequency Provider Last Rate Last Dose  . 0.9 %  sodium chloride infusion   Intravenous Once Henderson Cloud, MD      . 0.9 %  sodium chloride infusion   Intravenous Continuous Henderson Cloud, MD 50 mL/hr at 05/15/15 0600    . morphine 2 MG/ML injection 0.5 mg  0.5 mg Intravenous Q4H PRN  Henderson Cloud, MD      . ondansetron [facility]) tablet 4 mg  4 mg Oral Q6H PRN Henderson Cloud, MD       Or  . ondansetron [facility]) injection 4 mg  4 mg Intravenous Q6H PRN Henderson Cloud, MD      . pantoprazole (PROTONIX) injection 40 mg  40 mg Intravenous Q12H Henderson Cloud, MD   40 mg at 05/15/15 0900    Allergies as of 05/14/2015  . (No Known Allergies)    Family History  Problem Relation Age of Onset  . Colon cancer Father   . Colon cancer Paternal Aunt   . Crohn's disease Son     Social History   Social History  . Marital Status: Widowed    Spouse Name: N/A  . Number of Children: N/A  . Years of Education: N/A   Occupational History  . Not on file.   Social History Main Topics  . Smoking status: Never Smoker   . Smokeless tobacco: Not on file  . Alcohol Use: No  . Drug Use: No  . Sexual Activity: Not on file   Other Topics Concern  . Not on file   Social History Narrative   Widowed   Daughter    Review of Systems: Gen: see HP I CV: +palpitatons Resp: Denies shortness of breath with rest, cough, wheezing GI: see HP I GU : Denies urinary burning, urinary frequency, urinary incontinence.  MS: +arthritis  Derm: Denies rash, itching, dry skin Psych: Denies depression, anxiety,confusion, or memory loss Heme: see HPI   Physical Exam: Vital signs in last 24 hours: Temp:  [97 F (36.1 C)-98.3 F (36.8 C)] 97.3 F (36.3 C) (04/24 0400) Pulse Rate:  [35-132] 35 (04/24 0600) Resp:  [10-26] 14 (04/24 0600) BP: (80-191)/(35-121) 101/48 mmHg (04/24 0600) SpO2:  [98 %-100 %] 100 % (04/24 0600) Weight:  [152 lb 12.5 oz (69.3 kg)] 152 lb 12.5 oz (69.3 kg) (04/24 0500) Last BM Date: 05/14/15 General:   Alert, appears stated age, weak, pale.  Head:  Normocephalic and atraumatic. Eyes:  Sclera clear, no icterus. conjuctiva pale  Ears:  Normal auditory acuity. Nose:  No deformity, discharge,  or lesions. Mouth:  No  deformity or lesions Lungs:  Clear throughout to auscultation.    Heart:  Irregularly irregular, systolic murmur appreciated  Abdomen:  Soft, nontender and nondistended. Query umbilical hernia Rectal:  Deferred  Msk:  Symmetrical without gross deformities.  Extremities:  Without edema. Neurologic:  Alert and  oriented x4 Psych:  Alert and cooperative. Normal mood and affect.  Intake/Output from previous day: 04/23 0701 - 04/24 0700 In: 2051.7 [P.O.:480; I.V.:901.7; Blood:670] Out: 1251 [Urine:1250; Stool:1] Intake/Output this shift:    Lab Results:  Recent Labs  05/14/15 0846 05/14/15 1843 05/15/15 0229  WBC 9.0 9.0 7.8  HGB 5.3* 8.1* 7.2*  HCT 16.1* 23.3* 20.9*  PLT 133* 126* 117*   BMET  Recent Labs  05/14/15 0846 05/15/15 0229  NA 143 143  K 3.7 3.5  CL 114* 117*  CO2 20* 20*  GLUCOSE 177* 100*  BUN 37* 31*  CREATININE 1.21* 1.05*  CALCIUM 8.7* 8.3*   LFT  Recent Labs  05/14/15 0846  PROT 5.0*  ALBUMIN 2.9*  AST 26  ALT 15  ALKPHOS 33*  BILITOT 0.6   PT/INR  Recent Labs  05/14/15 0846  LABPROT 40.9*  INR 4.41*    Studies/Results: Dg Chest Port 1 View  05/14/2015  CLINICAL DATA:  weakness EXAM: PORTABLE CHEST - 1 VIEW COMPARISON:  01/19/2009 FINDINGS: Stable left subclavian pacemaker. Moderate cardiomegaly. Large hiatal hernia with fluid level. Atheromatous aorta. Subsegmental atelectasis or early infiltrate at the left lung base partially obscures the left diaphragmatic leaflet. Can't exclude small left pleural effusion. No pneumothorax. Visualized skeletal structures are unremarkable. IMPRESSION: 1. Stable cardiomegaly 2. Large hiatal hernia 3. Left lower lobe patchy atelectasis versus early infiltrate. Can't exclude small left effusion. Electronically Signed   By: Corlis Leak M.D.   On: 05/14/2015 09:16    Impression: 82 year old very pleasant female admitted with likely upper GI bleed in the setting of Xarelto for atrial fibrillation, noted  to have an INR of 4.41 on admission. Improvement in Hgb after 2 units PRBCs but drifting down a gram this morning. Continued evidence of melena overnight in setting of coagulopathy, which will hopefully improve as INR drifts down and as Xarelto is held. Last dose of Xarelto appears to be 05/13/15 in the evening. With supratherapeutic INR, will hold on endoscopy today but tentatively plan on upper endoscopy on 4/25 with Dr. Darrick Penna. Continue supportive measures, serial H/H, transfuse as needed. Will allow clear liquids for now. Continue to hold Xarelto. As of note, patient has a history in the remote past of occult GI bleed, evaluated by Dr. Katrinka Blazing but without any etiology found per family. These notes are not available in epic. If upper endoscopy unrevealing, would recommend colonoscopy to be considered. She has a positive family history of colon cancer (father).   Plan: Recheck INR Serial H/H Supportive care PPI IV BID May have clear liquids Continue to hold Xarelto Tentative EGD for 4/25 with Dr. Darrick Penna NPO after midnight   Nira Retort, ANP-BC [facility]     LOS: 1 day    05/15/2015, 8:51 AM

## 2015-05-15 NOTE — Clinical Documentation Improvement (Signed)
Internal Medicine  Can the diagnosis of Atrial Fibrillation be further specified by acuity? Thank you   Chronic Atrial fibrillation  Paroxysmal Atrial fibrillation  Permanent Atrial fibrillation  Persistent Atrial fibrillation  Other  Clinically Undetermined     Supporting Information: Pt on Xarelto for afib tx   Please exercise your independent, professional judgment when responding. A specific answer is not anticipated or expected.   Thank You,  Lavonda Jumbo Health Information Management [place] [phone]

## 2015-05-15 NOTE — Care Management Note (Signed)
Case Management Note  Patient Details  Name: [name] MRN: [id number] Date of Birth: 1920/07/25  Subjective/Objective:                  Pt admitted with sever anemia. Pt is from home, lives alone and has 7 children who provide strong support. Two daughters at the bedside during assessment. Pt has cane, walker and wheelchair for use if needed. Pt plans to return home at DC.   Action/Plan: Anticipate pt may need HH services at DC. Pt will need PT eval prior to DC.  Will cont to follow.   Expected Discharge Date:    05/21/2015              Expected Discharge Plan:  Home w Home Health Services  In-House Referral:  NA  Discharge planning Services  CM Consult  Post Acute Care Choice:  Home Health Choice offered to:  Patient  DME Arranged:    DME Agency:     HH Arranged:    HH Agency:     Status of Service:  In process, will continue to follow  Medicare Important Message Given:    Date Medicare IM Given:    Medicare IM give by:    Date Additional Medicare IM Given:    Additional Medicare Important Message give by:     If discussed at Long Length of Stay Meetings, dates discussed:    Additional Comments:  Malcolm Metro, RN 05/15/2015, 2:33 PM

## 2015-05-15 NOTE — Progress Notes (Signed)
Critical Hgb of 5.9 called by lab. MD on call notified via text page.

## 2015-05-15 NOTE — Progress Notes (Signed)
DR  DAVID CALLED AND GIVEN 0230 HGB 7.2 . PT'S VITAL SIGNS ARE STABLE. NO STOOL SINCE 1600 05/14/15.NO NEW ORDERS AT THIS TIME. WILL WAIT TO SEE NEXT RESULTS.

## 2015-05-16 ENCOUNTER — Encounter (HOSPITAL_COMMUNITY): Payer: Self-pay | Admitting: *Deleted

## 2015-05-16 ENCOUNTER — Encounter (HOSPITAL_COMMUNITY)
Admission: EM | Disposition: A | Discharge status: Home Health | Payer: Self-pay | Source: Home / Self Care | Attending: Internal Medicine

## 2015-05-16 ENCOUNTER — Ambulatory Visit: Payer: Medicare Other | Admitting: Cardiovascular Disease

## 2015-05-16 DIAGNOSIS — Q399 Congenital malformation of esophagus, unspecified: Secondary | ICD-10-CM

## 2015-05-16 HISTORY — PX: ESOPHAGOGASTRODUODENOSCOPY: SHX5428

## 2015-05-16 LAB — HEMOGLOBIN AND HEMATOCRIT, BLOOD
HCT: 27.2 % — ABNORMAL LOW (ref 36.0–46.0)
HEMATOCRIT: 30 % — AB (ref 36.0–46.0)
HEMOGLOBIN: 9.6 g/dL — AB (ref 12.0–15.0)
HEMOGLOBIN: 10.5 g/dL — AB (ref 12.0–15.0)

## 2015-05-16 LAB — MRSA PCR SCREENING: MRSA by PCR: NEGATIVE

## 2015-05-16 SURGERY — EGD (ESOPHAGOGASTRODUODENOSCOPY)
Anesthesia: Moderate Sedation

## 2015-05-16 MED ORDER — MIDAZOLAM HCL 5 MG/5ML IJ SOLN
INTRAMUSCULAR | Status: DC | PRN
  Administered 2015-05-16: 2 mg via INTRAVENOUS

## 2015-05-16 MED ORDER — STERILE WATER FOR IRRIGATION IR SOLN
Status: DC | PRN
  Administered 2015-05-16: 15:00:00

## 2015-05-16 MED ORDER — MEPERIDINE HCL 100 MG/ML IJ SOLN
INTRAMUSCULAR | Status: AC
  Filled 2015-05-16: qty 2

## 2015-05-16 MED ORDER — PANTOPRAZOLE SODIUM 40 MG PO TBEC
40.0000 mg | DELAYED_RELEASE_TABLET | Freq: Two times a day (BID) | ORAL | Status: DC
  Administered 2015-05-16 – 2015-05-18 (×5): 40 mg via ORAL
  Filled 2015-05-16 (×6): qty 1

## 2015-05-16 MED ORDER — LIDOCAINE VISCOUS 2 % MT SOLN
OROMUCOSAL | Status: DC | PRN
  Administered 2015-05-16: 3 mL via OROMUCOSAL

## 2015-05-16 MED ORDER — FENTANYL CITRATE (PF) 100 MCG/2ML IJ SOLN
INTRAMUSCULAR | Status: DC | PRN
  Administered 2015-05-16: 25 ug via INTRAVENOUS

## 2015-05-16 MED ORDER — LIDOCAINE VISCOUS 2 % MT SOLN
OROMUCOSAL | Status: AC
  Filled 2015-05-16: qty 15

## 2015-05-16 MED ORDER — FENTANYL CITRATE (PF) 100 MCG/2ML IJ SOLN
INTRAMUSCULAR | Status: AC
  Filled 2015-05-16: qty 2

## 2015-05-16 MED ORDER — SODIUM CHLORIDE 0.9 % IV SOLN
INTRAVENOUS | Status: DC
  Administered 2015-05-16: 14:00:00 via INTRAVENOUS

## 2015-05-16 MED ORDER — MIDAZOLAM HCL 5 MG/5ML IJ SOLN
INTRAMUSCULAR | Status: AC
  Filled 2015-05-16: qty 10

## 2015-05-16 NOTE — H&P (Addendum)
Primary Care Physician:  Avon Gully, MD Primary Gastroenterologist:  Dr. Darrick Penna  Pre-Procedure History & Physical: HPI:  [name] is a 82 y.o. female here for MELENA/ANEMIA.  Past Medical History  Diagnosis Date  . DJD (degenerative joint disease)   . Osteoporosis   . Anxiety   . Sleep apnea   . Seasonal allergies   . Anemia   . Hypertension   . Atrial fibrillation [facility])     Past Surgical History  Procedure Laterality Date  . Pacemaker insertion    . Hemorrhoid surgery    . Cholecystectomy    . Total hip arthroplasty      Prior to Admission medications   Medication Sig Start Date End Date Taking? Authorizing Provider  acetaminophen (TYLENOL) 650 MG CR tablet Take 650 mg by mouth every 8 (eight) hours as needed for pain.    Yes Historical Provider, MD  amiodarone (PACERONE) 200 MG tablet Take 1 tablet (200 mg total) by mouth daily. 05/01/15  Yes Marinus Maw, MD  amLODipine (NORVASC) 5 MG tablet Take 1 tablet (5 mg total) by mouth daily. 05/25/14  Yes Laqueta Linden, MD  aspirin 81 MG tablet Take 81 mg by mouth daily.     Yes Historical Provider, MD  calcium-vitamin D (OSCAL WITH D) 500-200 MG-UNIT per tablet Take 1 tablet by mouth daily.     Yes Historical Provider, MD  ferrous sulfate 325 (65 FE) MG EC tablet Take 65 mg by mouth 3 (three) times daily with meals.   Yes Historical Provider, MD  furosemide (LASIX) 40 MG tablet Take 1 tablet (40 mg total) by mouth daily. Patient taking differently: Take 20 mg by mouth daily. Decreased to 20 mg daily. May take an extra 20 mg if leg swelling/weight gain occurs. 03/08/15  Yes Dyann Kief, PA-C  hydrochlorothiazide (HYDRODIURIL) 25 MG tablet Take 25 mg by mouth daily.  03/07/15  Yes Historical Provider, MD  lisinopril (PRINIVIL,ZESTRIL) 40 MG tablet Take 1 tablet (40 mg total) by mouth daily. 04/08/12  Yes Marinus Maw, MD  potassium chloride SA (K-DUR,KLOR-CON) 20 MEQ tablet Take 1 tablet (20 mEq total) by mouth daily.  03/08/15  Yes Dyann Kief, PA-C  pravastatin (PRAVACHOL) 40 MG tablet Take 40 mg by mouth daily.   Yes Historical Provider, MD  Rivaroxaban (XARELTO) 15 MG TABS tablet Take 1 tablet (15 mg total) by mouth daily with supper. 04/26/15  Yes Dyann Kief, PA-C    Allergies as of 05/14/2015  . (No Known Allergies)    Family History  Problem Relation Age of Onset  . Colon cancer Father   . Colon cancer Paternal Aunt   . Crohn's disease Son     Social History   Social History  . Marital Status: Widowed    Spouse Name: N/A  . Number of Children: N/A  . Years of Education: N/A   Occupational History  . Not on file.   Social History Main Topics  . Smoking status: Never Smoker   . Smokeless tobacco: Not on file  . Alcohol Use: No  . Drug Use: No  . Sexual Activity: Not on file   Other Topics Concern  . Not on file   Social History Narrative   Widowed   Daughter    Review of Systems: See HPI, otherwise negative ROS   Physical Exam: BP 142/69 mmHg  Pulse 77  Temp(Src) 97.6 F (36.4 C) (Oral)  Resp 18  Ht 5\' 3"  (1.6  m)  Wt 152 lb (68.947 kg)  BMI 26.93 kg/m2  SpO2 100% General:   Alert,  pleasant and cooperative in NAD Head:  Normocephalic and atraumatic. Neck:  Supple; Lungs:  Clear throughout to auscultation.    Heart:  Regular rate and irregular rhythm. Abdomen:  Soft, nontender and nondistended. Normal bowel sounds, without guarding, and without rebound.   Neurologic:  Alert and  oriented x4;  grossly normal neurologically.  Impression/Plan:     MELENA/ANEMIA  PLAN: 1. EGD TODAY

## 2015-05-16 NOTE — Progress Notes (Signed)
Subjective: Patient is planned for EGD today. She continue to drop her H/H. No nausea or vomiting. No abdominal pain.  Objective: Vital signs in last 24 hours: Temp:  [97.1 F (36.2 C)-98 F (36.7 C)] 97.8 F (36.6 C) (04/25 0245) Pulse Rate:  [36-105] 98 (04/25 0245) Resp:  [8-29] 8 (04/25 0245) BP: (87-133)/(37-92) 91/55 mmHg (04/25 0245) SpO2:  [98 %-100 %] 99 % (04/25 0245) Weight change:  Last BM Date: 05/15/15  Intake/Output from previous day: 04/24 0701 - 04/25 0700 In: 1863.8 [P.O.:600; I.V.:600; Blood:663.8] Out: -   PHYSICAL EXAM General appearance: no distress and slowed mentation Resp: clear to auscultation bilaterally Cardio: regularly irregular rhythm GI: soft, non-tender; bowel sounds normal; no masses,  no organomegaly Extremities: extremities normal, atraumatic, no cyanosis or edema  Lab Results:  Results for orders placed or performed during the hospital encounter of 05/14/15 (from the past 48 hour(s))  CBC     Status: Abnormal   Collection Time: 05/14/15  8:46 AM  Result Value Ref Range   WBC 9.0 4.0 - 10.5 K/uL   RBC 1.70 (L) 3.87 - 5.11 MIL/uL   Hemoglobin 5.3 (LL) 12.0 - 15.0 g/dL    Comment: CRITICAL RESULT CALLED TO, READ BACK BY AND VERIFIED WITH: CRUISE, A AT 0943 ON 05/14/15 BY WOODS,M    HCT 16.1 (L) 36.0 - 46.0 %   MCV 94.7 78.0 - 100.0 fL   MCH 31.2 26.0 - 34.0 pg   MCHC 32.9 30.0 - 36.0 g/dL   RDW 15.1 11.5 - 15.5 %   Platelets 133 (L) 150 - 400 K/uL  Comprehensive metabolic panel     Status: Abnormal   Collection Time: 05/14/15  8:46 AM  Result Value Ref Range   Sodium 143 135 - 145 mmol/L   Potassium 3.7 3.5 - 5.1 mmol/L   Chloride 114 (H) 101 - 111 mmol/L   CO2 20 (L) 22 - 32 mmol/L   Glucose, Bld 177 (H) 65 - 99 mg/dL   BUN 37 (H) 6 - 20 mg/dL   Creatinine, Ser 1.21 (H) 0.44 - 1.00 mg/dL   Calcium 8.7 (L) 8.9 - 10.3 mg/dL   Total Protein 5.0 (L) 6.5 - 8.1 g/dL   Albumin 2.9 (L) 3.5 - 5.0 g/dL   AST 26 15 - 41 U/L   ALT 15  14 - 54 U/L   Alkaline Phosphatase 33 (L) 38 - 126 U/L   Total Bilirubin 0.6 0.3 - 1.2 mg/dL   GFR calc non Af Amer 38 (L) >60 mL/min   GFR calc Af Amer 44 (L) >60 mL/min    Comment: (NOTE) The eGFR has been calculated using the CKD EPI equation. This calculation has not been validated in all clinical situations. eGFR's persistently <60 mL/min signify possible Chronic Kidney Disease.    Anion gap 9 5 - 15  Troponin I     Status: Abnormal   Collection Time: 05/14/15  8:46 AM  Result Value Ref Range   Troponin I 0.05 (H) <0.031 ng/mL    Comment:        PERSISTENTLY INCREASED TROPONIN VALUES IN THE RANGE OF 0.04-0.49 ng/mL CAN BE SEEN IN:       -UNSTABLE ANGINA       -CONGESTIVE HEART FAILURE       -MYOCARDITIS       -CHEST TRAUMA       -ARRYHTHMIAS       -LATE PRESENTING MYOCARDIAL INFARCTION       -COPD  CLINICAL FOLLOW-UP RECOMMENDED.   Protime-INR     Status: Abnormal   Collection Time: 05/14/15  8:46 AM  Result Value Ref Range   Prothrombin Time 40.9 (H) 11.6 - 15.2 seconds   INR 4.41 (H) 0.00 - 1.49  POC occult blood, ED Provider will collect     Status: Abnormal   Collection Time: 05/14/15  9:55 AM  Result Value Ref Range   Fecal Occult Bld POSITIVE (A) NEGATIVE  Type and screen     Status: None (Preliminary result)   Collection Time: 05/14/15 10:05 AM  Result Value Ref Range   ABO/RH(D) O POS    Antibody Screen NEG    Sample Expiration 05/17/2015    Unit Number [id number]    Blood Component Type RED CELLS,LR    Unit division 00    Status of Unit ISSUED    Transfusion Status OK TO TRANSFUSE    Crossmatch Result Compatible    Unit Number [id number]    Blood Component Type RED CELLS,LR    Unit division 00    Status of Unit ISSUED,FINAL    Transfusion Status OK TO TRANSFUSE    Crossmatch Result Compatible    Unit Number [id number]    Blood Component Type RED CELLS,LR    Unit division 00    Status of Unit ISSUED    Transfusion Status OK TO  TRANSFUSE    Crossmatch Result Compatible    Unit Number [id number]    Blood Component Type RED CELLS,LR    Unit division 00    Status of Unit ISSUED,FINAL    Transfusion Status OK TO TRANSFUSE    Crossmatch Result Compatible   Prepare RBC     Status: None   Collection Time: 05/14/15 10:05 AM  Result Value Ref Range   Order Confirmation ORDER PROCESSED BY BLOOD BANK   Prepare RBC     Status: None   Collection Time: 05/14/15 10:05 AM  Result Value Ref Range   Order Confirmation ORDER PROCESSED BY BLOOD BANK   ABO/Rh     Status: None   Collection Time: 05/14/15 10:05 AM  Result Value Ref Range   ABO/RH(D) O POS   CBC     Status: Abnormal   Collection Time: 05/14/15  6:43 PM  Result Value Ref Range   WBC 9.0 4.0 - 10.5 K/uL   RBC 2.62 (L) 3.87 - 5.11 MIL/uL   Hemoglobin 8.1 (L) 12.0 - 15.0 g/dL    Comment: POST TRANSFUSION SPECIMEN   HCT 23.3 (L) 36.0 - 46.0 %   MCV 88.9 78.0 - 100.0 fL   MCH 30.9 26.0 - 34.0 pg   MCHC 34.8 30.0 - 36.0 g/dL   RDW 14.9 11.5 - 15.5 %   Platelets 126 (L) 150 - 400 K/uL  Basic metabolic panel     Status: Abnormal   Collection Time: 05/15/15  2:29 AM  Result Value Ref Range   Sodium 143 135 - 145 mmol/L   Potassium 3.5 3.5 - 5.1 mmol/L   Chloride 117 (H) 101 - 111 mmol/L   CO2 20 (L) 22 - 32 mmol/L   Glucose, Bld 100 (H) 65 - 99 mg/dL   BUN 31 (H) 6 - 20 mg/dL   Creatinine, Ser 1.05 (H) 0.44 - 1.00 mg/dL   Calcium 8.3 (L) 8.9 - 10.3 mg/dL   GFR calc non Af Amer 44 (L) >60 mL/min   GFR calc Af Amer 51 (L) >60 mL/min    Comment: (NOTE) The eGFR  has been calculated using the CKD EPI equation. This calculation has not been validated in all clinical situations. eGFR's persistently <60 mL/min signify possible Chronic Kidney Disease.    Anion gap 6 5 - 15  CBC     Status: Abnormal   Collection Time: 05/15/15  2:29 AM  Result Value Ref Range   WBC 7.8 4.0 - 10.5 K/uL   RBC 2.34 (L) 3.87 - 5.11 MIL/uL   Hemoglobin 7.2 (L) 12.0 - 15.0 g/dL    HCT 20.9 (L) 36.0 - 46.0 %   MCV 89.3 78.0 - 100.0 fL   MCH 30.8 26.0 - 34.0 pg   MCHC 34.4 30.0 - 36.0 g/dL   RDW 16.2 (H) 11.5 - 15.5 %   Platelets 117 (L) 150 - 400 K/uL    Comment: SPECIMEN CHECKED FOR CLOTS  Hemoglobin and hematocrit, blood     Status: Abnormal   Collection Time: 05/15/15 11:40 AM  Result Value Ref Range   Hemoglobin 7.8 (L) 12.0 - 15.0 g/dL   HCT 22.4 (L) 36.0 - 46.0 %  Protime-INR     Status: Abnormal   Collection Time: 05/15/15 11:40 AM  Result Value Ref Range   Prothrombin Time 18.2 (H) 11.6 - 15.2 seconds   INR 1.50 (H) 0.00 - 1.49  Hemoglobin and hematocrit, blood     Status: Abnormal   Collection Time: 05/15/15  8:13 PM  Result Value Ref Range   Hemoglobin 5.9 (LL) 12.0 - 15.0 g/dL    Comment: REPEATED TO VERIFY DELTA CHECK NOTED CRITICAL RESULT CALLED TO, READ BACK BY AND VERIFIED WITH: MCDANIELS M AT 2100 ON [id number] BY FORSYTH K    HCT 17.2 (L) 36.0 - 46.0 %  Hemoglobin and hematocrit, blood     Status: Abnormal   Collection Time: 05/16/15  4:06 AM  Result Value Ref Range   Hemoglobin 9.6 (L) 12.0 - 15.0 g/dL    Comment: DELTA CHECK NOTED   HCT 27.2 (L) 36.0 - 46.0 %    ABGS No results for input(s): PHART, PO2ART, TCO2, HCO3 in the last 72 hours.  Invalid input(s): PCO2 CULTURES No results found for this or any previous visit (from the past 240 hour(s)). Studies/Results: Dg Chest Port 1 View  05/14/2015  CLINICAL DATA:  weakness EXAM: PORTABLE CHEST - 1 VIEW COMPARISON:  01/19/2009 FINDINGS: Stable left subclavian pacemaker. Moderate cardiomegaly. Large hiatal hernia with fluid level. Atheromatous aorta. Subsegmental atelectasis or early infiltrate at the left lung base partially obscures the left diaphragmatic leaflet. Can't exclude small left pleural effusion. No pneumothorax. Visualized skeletal structures are unremarkable. IMPRESSION: 1. Stable cardiomegaly 2. Large hiatal hernia 3. Left lower lobe patchy atelectasis versus early  infiltrate. Can't exclude small left effusion. Electronically Signed   By: Lucrezia Europe M.D.   On: 05/14/2015 09:16    Medications: I have reviewed the patient's current medications.  Assesment:   Principal Problem:   Severe anemia Active Problems:   Essential hypertension, benign   Atrial fibrillation (HCC)   Melena   Weakness generalized   GI bleed   Acute GI bleeding    Plan:  Medications reviewed Will monitor CBC Gi consult appreciated EGD as planned   LOS: 2 days   Aleya Durnell 05/16/2015, 7:59 AM

## 2015-05-16 NOTE — Progress Notes (Signed)
Patient seen this morning briefly to discuss EGD for later today with Dr. Darrick Penna. Several daughters at bedside. No further melena. Hgb dropped to 5.9 overnight, and she received an additional 2 units of PRBCs (total of 4 this admission). Hgb now improved to 9.6. INR 1.5 yesterday. Xarelto has been on hold since admission, with last dose 4/22 in the evening. Vitals are stable. Patient agreeable to EGD, understanding benefits and risks.   Nira Retort, ANP-BC [facility]

## 2015-05-16 NOTE — Progress Notes (Signed)
Pt a/o.vss. IV patent. No complaints of any distress. Report given to C.Knight, RN. Pt to be transferred to 324 via bed. Family visiting at bedside.

## 2015-05-16 NOTE — Op Note (Signed)
[facility] Patient Name: [name] Procedure Date: 05/16/2015 2:36 PM MRN: [id number] Date of Birth: 20-Mar-1920 Attending MD: Jonette Eva , MD CSN: [id number] Age: 82 Admit Type: Outpatient Procedure:                Upper GI endoscopy Indications:              Melena, Recent gastrointestinal bleeding-FEB 2017                            PT'S Hb WAS 13.9 AND PLT CT NOTED AT 131K. ON ASA                            DAILY W/O PPI. STARTED ON XARELTO FEB 2017. Providers:                Jonette Eva, MD, Brain Hilts, RN, Jannett Celestine,                            RN, Birder Robson, Technician Referring MD:             Avon Gully, MD; Prentice Docker, MD; Lewayne Bunting, MD, Gennette Pac, MD Medicines:                Fentanyl 25 micrograms IV, Midazolam 2 mg IV Complications:            No immediate complications. Estimated blood loss:                            Minimal. Estimated Blood Loss:     Estimated blood loss was minimal. Procedure:                Pre-Anesthesia Assessment:                           - Prior to the procedure, a History and Physical                            was performed, and patient medications and                            allergies were reviewed. The patient's tolerance of                            previous anesthesia was also reviewed. The risks                            and benefits of the procedure and the sedation                            options and risks were discussed with the patient.                            All questions were answered, and informed consent  was obtained. Prior Anticoagulants: The patient has                            taken Xarelto (rivaroxaban), last dose was 3 days                            prior to procedure. ASA Grade Assessment: III - A                            patient with severe systemic disease. After                            reviewing the risks and  benefits, the patient was                            deemed in satisfactory condition to undergo the                            procedure.                           After obtaining informed consent, the endoscope was                            passed under direct vision. Throughout the                            procedure, the patient's blood pressure, pulse, and                            oxygen saturations were monitored continuously. The                            [id number] ([id number]) scope was introduced through the                            mouth, and advanced to the second part of duodenum.                            The upper GI endoscopy was somewhat difficult due                            to unusual anatomy. The patient tolerated the                            procedure well. Scope In: 2:59:39 PM Scope Out: 3:11:39 PM Total Procedure Duration: 0 hours 12 minutes 0 seconds  Findings:      The distal esophagus was grossly tortuous.      A medium-sized hiatal hernia was present.      A small PARA-ESOPHAGEAL hernia was found. PT APPEARED TO HAVE A PARTIAL       GASTRIC VOLVULUS.      The examined duodenum was normal.      A few dispersed, LINEAR non-bleeding erosions were found in  the gastric       fundus. There were no stigmata of recent bleeding.      Multiple dispersed, small non-bleeding erosions were found in the       gastric antrum. There were no stigmata of recent bleeding. Biopsies were       taken with a cold forceps for Helicobacter pylori testing.      The duodenal bulb and second portion of the duodenum were normal. Impression:               - Tortuous DISTAL esophagus.                           - Medium-sized hiatal hernia WITH CAMERON'S                            EROSIONS.                           - Small paraesophageal hernia. PROBABLE INCOMPLETE                            GASTRIC VOLVULUS.                           - Non-bleeding erosive ANTRAL GASTRITIS                            - UGI BLEED MOST LIKELY DUE TO CAMERON'S EROSION                            AND EROSIVE GASTRITIS Moderate Sedation:      Moderate (conscious) sedation was administered by the endoscopy nurse       and supervised by the endoscopist. The following parameters were       monitored: oxygen saturation, heart rate, blood pressure, and response       to care. Total physician intraservice time was 35 minutes. Recommendation:           - Return patient to hospital ward for ongoing care.                           - Soft diet: DYSPHAGIA 3 WITH 4 GM SODIUM                            RESTRICTION.                           - Use Protonix (pantoprazole) 40 mg PO BID FOR 3                            MOS THEN ONCE DAILY IF ASA CONTINUED.                           - Await pathology results.                           - Return to DR.ROURK in 3 months. NO INDICATION FOR  COLONOSCOPY AT THIS TIME UNLESS PT DEVELOPS                            TRANSFUSION DEPENDENT ANEMIA OFF ANTICOAGULATION.                           RECOMMEND MONTHLY CBCs UNTIL NEXT VISIT WITH GI.                            WOULD AVOID ANTICOAGULATION. CONSIDER BENEFITS V.                            RISKS OF ASA OR PLAVIX IF INDICATED.                           - Stop anticoagulation INDEFINITELY. Procedure Code(s):        --- Professional ---                           [phone], Moderate sedation services provided by the                            same physician or other qualified health care                            professional performing the diagnostic or                            therapeutic service that the sedation supports,                            requiring the presence of an independent trained                            observer to assist in the monitoring of the                            patient's level of consciousness and physiological                            status; initial 15  minutes of intraservice time,                            patient age 36 years or older                           [phone], Moderate sedation services; each additional                            15 minutes intraservice time Diagnosis Code(s):        --- Professional ---                           Q39.9, Congenital malformation of esophagus,  unspecified                           K44.9, Diaphragmatic hernia without obstruction or                            gangrene                           K31.89, Other diseases of stomach and duodenum                           K92.1, Melena (includes Hematochezia)                           K92.2, Gastrointestinal hemorrhage, unspecified CPT copyright 2016 [employer]. All rights reserved. The codes documented in this report are preliminary and upon coder review may  be revised to meet current compliance requirements. Jonette Eva, MD Jonette Eva, MD 05/16/2015 6:15:57 PM This report has been signed electronically. Number of Addenda: 0

## 2015-05-17 ENCOUNTER — Telehealth: Payer: Self-pay | Admitting: Gastroenterology

## 2015-05-17 DIAGNOSIS — D649 Anemia, unspecified: Secondary | ICD-10-CM

## 2015-05-17 LAB — TYPE AND SCREEN
ABO/RH(D): O POS
Antibody Screen: NEGATIVE
UNIT DIVISION: 0
UNIT DIVISION: 0
UNIT DIVISION: 0
Unit division: 0

## 2015-05-17 LAB — CBC
HCT: 31.4 % — ABNORMAL LOW (ref 36.0–46.0)
Hemoglobin: 10.7 g/dL — ABNORMAL LOW (ref 12.0–15.0)
MCH: 31.3 pg (ref 26.0–34.0)
MCHC: 34.1 g/dL (ref 30.0–36.0)
MCV: 91.8 fL (ref 78.0–100.0)
Platelets: 123 10*3/uL — ABNORMAL LOW (ref 150–400)
RBC: 3.42 MIL/uL — ABNORMAL LOW (ref 3.87–5.11)
RDW: 16.5 % — AB (ref 11.5–15.5)
WBC: 8.2 10*3/uL (ref 4.0–10.5)

## 2015-05-17 MED ORDER — AMLODIPINE BESYLATE 5 MG PO TABS
5.0000 mg | ORAL_TABLET | Freq: Every day | ORAL | Status: DC
  Administered 2015-05-17 – 2015-05-19 (×3): 5 mg via ORAL
  Filled 2015-05-17 (×3): qty 1

## 2015-05-17 MED ORDER — LISINOPRIL 10 MG PO TABS
40.0000 mg | ORAL_TABLET | Freq: Every day | ORAL | Status: DC
  Administered 2015-05-17 – 2015-05-19 (×3): 40 mg via ORAL
  Filled 2015-05-17 (×3): qty 4

## 2015-05-17 MED ORDER — FERROUS SULFATE 325 (65 FE) MG PO TBEC
325.0000 mg | DELAYED_RELEASE_TABLET | Freq: Three times a day (TID) | ORAL | Status: DC
  Administered 2015-05-17: 325 mg via ORAL
  Filled 2015-05-17 (×8): qty 1

## 2015-05-17 MED ORDER — CALCIUM CARBONATE-VITAMIN D 500-200 MG-UNIT PO TABS
1.0000 | ORAL_TABLET | Freq: Every day | ORAL | Status: DC
  Administered 2015-05-17 – 2015-05-19 (×3): 1 via ORAL
  Filled 2015-05-17 (×3): qty 1

## 2015-05-17 MED ORDER — FERROUS SULFATE 325 (65 FE) MG PO TABS
325.0000 mg | ORAL_TABLET | Freq: Three times a day (TID) | ORAL | Status: DC
  Administered 2015-05-17 – 2015-05-19 (×5): 325 mg via ORAL
  Filled 2015-05-17 (×6): qty 1

## 2015-05-17 MED ORDER — PRAVASTATIN SODIUM 40 MG PO TABS
40.0000 mg | ORAL_TABLET | Freq: Every day | ORAL | Status: DC
  Administered 2015-05-17 – 2015-05-19 (×3): 40 mg via ORAL
  Filled 2015-05-17 (×3): qty 1

## 2015-05-17 MED ORDER — AMIODARONE HCL 200 MG PO TABS
200.0000 mg | ORAL_TABLET | Freq: Every day | ORAL | Status: DC
  Administered 2015-05-17 – 2015-05-19 (×3): 200 mg via ORAL
  Filled 2015-05-17 (×3): qty 1

## 2015-05-17 NOTE — Care Management Important Message (Signed)
Important Message  Patient Details  Name: [name] MRN: [id number] Date of Birth: [birth year]   Medicare Important Message Given:  Yes    Adonis Huguenin, RN 05/17/2015, 1:50 PM

## 2015-05-17 NOTE — Progress Notes (Signed)
Subjective: Patient is resting. She feels weak. No hematemesis, melena or abdominal pain. Patient  Had EGD yesterday and no active bleeding was identified. Advised to stop anticoagulation indefinitely.   Objective: Vital signs in last 24 hours: Temp:  [96.8 F (36 C)-98.2 F (36.8 C)] 97.7 F (36.5 C) (04/26 0554) Pulse Rate:  [35-109] 109 (04/26 0554) Resp:  [14-22] 15 (04/26 0554) BP: (81-148)/(57-109) 140/70 mmHg (04/26 0554) SpO2:  [94 %-100 %] 95 % (04/26 0554) Weight:  [68.947 kg (152 lb)] 68.947 kg (152 lb) (04/25 1336) Weight change:  Last BM Date: 05/15/15  Intake/Output from previous day: 04/25 0701 - 04/26 0700 In: 515 [P.O.:240; I.V.:275] Out: 175 [Urine:175]  PHYSICAL EXAM General appearance: no distress and slowed mentation Resp: clear to auscultation bilaterally Cardio: regularly irregular rhythm GI: soft, non-tender; bowel sounds normal; no masses,  no organomegaly Extremities: extremities normal, atraumatic, no cyanosis or edema  Lab Results:  Results for orders placed or performed during the hospital encounter of 05/14/15 (from the past 48 hour(s))  Hemoglobin and hematocrit, blood     Status: Abnormal   Collection Time: 05/15/15 11:40 AM  Result Value Ref Range   Hemoglobin 7.8 (L) 12.0 - 15.0 g/dL   HCT 16.1 (L) 09.6 - 04.5 %  Protime-INR     Status: Abnormal   Collection Time: 05/15/15 11:40 AM  Result Value Ref Range   Prothrombin Time 18.2 (H) 11.6 - 15.2 seconds   INR 1.50 (H) 0.00 - 1.49  Hemoglobin and hematocrit, blood     Status: Abnormal   Collection Time: 05/15/15  8:13 PM  Result Value Ref Range   Hemoglobin 5.9 (LL) 12.0 - 15.0 g/dL    Comment: REPEATED TO VERIFY DELTA CHECK NOTED CRITICAL RESULT CALLED TO, READ BACK BY AND VERIFIED WITH: MCDANIELS M AT 2100 ON [id number] BY FORSYTH K    HCT 17.2 (L) 36.0 - 46.0 %  Hemoglobin and hematocrit, blood     Status: Abnormal   Collection Time: 05/16/15  4:06 AM  Result Value Ref Range   Hemoglobin 9.6 (L) 12.0 - 15.0 g/dL    Comment: DELTA CHECK NOTED   HCT 27.2 (L) 36.0 - 46.0 %  MRSA PCR Screening     Status: None   Collection Time: 05/16/15  9:30 AM  Result Value Ref Range   MRSA by PCR NEGATIVE NEGATIVE    Comment:        The GeneXpert MRSA Assay (FDA approved for NASAL specimens only), is one component of a comprehensive MRSA colonization surveillance program. It is not intended to diagnose MRSA infection nor to guide or monitor treatment for MRSA infections.   Hemoglobin and hematocrit, blood     Status: Abnormal   Collection Time: 05/16/15 11:47 AM  Result Value Ref Range   Hemoglobin 10.5 (L) 12.0 - 15.0 g/dL   HCT 91.4 (L) 78.2 - 95.6 %  CBC     Status: Abnormal   Collection Time: 05/17/15  4:30 AM  Result Value Ref Range   WBC 8.2 4.0 - 10.5 K/uL   RBC 3.42 (L) 3.87 - 5.11 MIL/uL   Hemoglobin 10.7 (L) 12.0 - 15.0 g/dL   HCT 21.3 (L) 08.6 - 57.8 %   MCV 91.8 78.0 - 100.0 fL   MCH 31.3 26.0 - 34.0 pg   MCHC 34.1 30.0 - 36.0 g/dL   RDW 46.9 (H) 62.9 - 52.8 %   Platelets 123 (L) 150 - 400 K/uL    ABGS No results  for input(s): PHART, PO2ART, TCO2, HCO3 in the last 72 hours.  Invalid input(s): PCO2 CULTURES Recent Results (from the past 240 hour(s))  MRSA PCR Screening     Status: None   Collection Time: 05/16/15  9:30 AM  Result Value Ref Range Status   MRSA by PCR NEGATIVE NEGATIVE Final    Comment:        The GeneXpert MRSA Assay (FDA approved for NASAL specimens only), is one component of a comprehensive MRSA colonization surveillance program. It is not intended to diagnose MRSA infection nor to guide or monitor treatment for MRSA infections.    Studies/Results: No results found.  Medications: I have reviewed the patient's current medications.  Assesment:   Principal Problem:   Severe anemia Active Problems:   Essential hypertension, benign   Atrial fibrillation (HCC)   Melena   Weakness generalized   GI bleed   Acute  GI bleeding    Plan:  Medications reviewed Will monitor CBC Physical theray We will reorder home medications.   LOS: 3 days   [name] 05/17/2015, 7:56 AM

## 2015-05-17 NOTE — Care Management (Signed)
Referral placed with Alroy Bailiff at [facility]

## 2015-05-17 NOTE — Evaluation (Signed)
Physical Therapy Evaluation Patient Details Name: [name] MRN: [id number] DOB: 1920/07/27 Today's Date: 05/17/2015   History of Present Illness  82 yo F admitted with melena/anemia.  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  Pt also with new c/o L foot pain upon awakening today.  H&H today: 10.7/31.4  Clinical Impression  Pt received sitting up on the [facility], dtr present, and pt is agreeable to PT evaluation.  Pt demonstrated limited mobility today due to onset of L mid foot pain that started this morning causing pt to not be able to weight bear through L LE very well.  Pt was also bradycardic with HR down to 44bpm during tx.  Pt required Mod A with RW for sit<>stand, and BSC<>chair transfer due to increased L foot pain and fatigue.  Pt demonstrates SOB during transfer, however SpO2 is 100% on RA with pt sounding congested.  Pt lives alone, and states she wants to go back home living independently like she was, however, today she demonstrated increased pain, and decreased strength and endurance which all impairs her functional mobility.  Therefore, recommend HHPT as well as 24/7 supervision/assistance.    Follow Up Recommendations Home health PT;Supervision/Assistance - 24 hour (Limited discussion on this today due to RN needing to give pt's meds. )    Equipment Recommendations  None recommended by PT    Recommendations for Other Services       Precautions / Restrictions Restrictions Weight Bearing Restrictions: No      Mobility  Bed Mobility Overal bed mobility:  (Not assessed, pt sitting up on BSC upon entry, and left sitting up in the chair. )                Transfers Overall transfer level: Needs assistance Equipment used: Rolling walker (2 wheeled) Transfers: Sit to/from [employer] Sit to Stand: Mod assist Stand pivot transfers: Mod assist       General transfer comment: Pt requires vc's to push up from the [facility].  Once  standing, she demonstrates very poor upright posture, and is nearly flexed at the hips at 90*.  Vc's for weight bearing through UE's with some improvement in posture, but not sustained.  Pt able to use the RW and take a few steps from BSC<>chair, but further gait limited due to increased L foot pain with weight bearing.  Pt requires Mod A for stand<>sit due to poor safety awareness, and pre-mature reaching for the chair before getting lined up.  Ambulation/Gait Ambulation/Gait assistance:  (Not assessed due to increased L foot pain with weight bearing. )              Stairs            Wheelchair Mobility    Modified Rankin (Stroke Patients Only)       Balance Overall balance assessment: Needs assistance         Standing balance support: Bilateral upper extremity supported Standing balance-Leahy Scale: Fair                               Pertinent Vitals/Pain Pain Assessment: 0-10 Pain Score: 3  Pain Location: L mid foot - pt can't describe other than it "just hurts." Pt states she woke up this morning with the pain in the L foot, and is having difficulty bearing weight through it.  Pain Intervention(s): Limited activity within patient's tolerance;Repositioned  Home Living Family/patient expects to be discharged to:: Private residence Living Arrangements: Alone Available Help at Discharge: Family (Pt has several children that live in the area. ) Type of Home: House Home Access: Stairs to enter   [employer] of Steps: 1 Home Layout: One level Home Equipment: Walker - 2 wheels;Cane - single point      Prior Function Level of Independence: Independent   Gait / Transfers Assistance Needed: Pt states that she holds on to things when ambulating in the house and uses the cane.  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 Pt states she uses the RW when going from room to room.   ADL's / Homemaking Assistance Needed: Pt states she is independent with  ADL's.   Comments: Pt with slowed speech, and sometimes difficult to understand at times.      Hand Dominance        Extremity/Trunk Assessment   Upper Extremity Assessment: Generalized weakness           Lower Extremity Assessment: Generalized weakness      Cervical / Trunk Assessment: Kyphotic  Communication   Communication: No difficulties  Cognition Arousal/Alertness: Awake/alert Behavior During Therapy: WFL for tasks assessed/performed Overall Cognitive Status: Within Functional Limits for tasks assessed                      General Comments      Exercises        Assessment/Plan    PT Assessment Patient needs continued PT services  PT Diagnosis Difficulty walking   PT Problem List Decreased strength;Decreased activity tolerance;Decreased balance;Decreased mobility;Decreased safety awareness;Decreased knowledge of use of DME;Decreased knowledge of precautions;Pain;Cardiopulmonary status limiting activity  PT Treatment Interventions DME instruction;Gait training;Functional mobility training;Therapeutic activities;Therapeutic exercise;Balance training;Patient/family education   PT Goals (Current goals can be found in the Care Plan section) Acute Rehab PT Goals Patient Stated Goal: Pt wants to go home and be as independent as she was previously.  PT Goal Formulation: With patient/family Time For Goal Achievement: 05/31/15 Potential to Achieve Goals: Fair    Frequency Min 5X/week   Barriers to discharge Decreased caregiver support Pt lives alone, but has children in the area.     Co-evaluation               End of Session Equipment Utilized During Treatment: Gait belt Activity Tolerance: Patient limited by pain;Patient limited by fatigue Patient left: in chair;with call bell/phone within reach Nurse Communication: Mobility status (RN notified of pt's location, bradycardia, and increased L foot pain)    Functional Assessment Tool Used:  Clinical Judgement Functional Limitation: Mobility: Walking and moving around Mobility: Walking and Moving Around Current Status (Z6109): At least 60 percent but less than 80 percent impaired, limited or restricted Mobility: Walking and Moving Around Goal Status [phone]): At least 40 percent but less than 60 percent impaired, limited or restricted    Time: 0925-1010 PT Time Calculation (min) (ACUTE ONLY): 45 min   Charges:   PT Evaluation $PT Eval Moderate Complexity: 1 Procedure PT Treatments $Therapeutic Activity: 23-37 mins   PT G Codes:   PT G-Codes **NOT FOR INPATIENT CLASS** Functional Assessment Tool Used: Clinical Judgement Functional Limitation: Mobility: Walking and moving around Mobility: Walking and Moving Around Current Status (U9811): At least 60 percent but less than 80 percent impaired, limited or restricted Mobility: Walking and Moving Around Goal Status [phone]): At least 40 percent but less than 60 percent impaired, limited or restricted  Beth Oceane Fosse, PT, DPT X: [phone]   05/17/2015, 11:15 AM

## 2015-05-17 NOTE — Progress Notes (Signed)
Subjective:  No complaints. Eating breakfast.   Objective: Vital signs in last 24 hours: Temp:  [96.8 F (36 C)-98.2 F (36.8 C)] 97.7 F (36.5 C) (04/26 0554) Pulse Rate:  [35-109] 109 (04/26 0554) Resp:  [14-22] 15 (04/26 0554) BP: (81-148)/(57-109) 140/70 mmHg (04/26 0554) SpO2:  [94 %-100 %] 95 % (04/26 0554) Weight:  [152 lb (68.947 kg)] 152 lb (68.947 kg) (04/25 1336) Last BM Date: 05/15/15 General:   Alert,  Well-developed, well-nourished, pleasant and cooperative in NAD Head:  Normocephalic and atraumatic. Eyes:  Sclera clear, no icterus.  Abdomen:  Soft, nontender and nondistended.  Extremities:  Without clubbing, deformity or edema. Neurologic:  Alert and  oriented x4;  grossly normal neurologically. Skin:  Intact without significant lesions or rashes. Psych:  Alert and cooperative. Normal mood and affect.  Intake/Output from previous day: 04/25 0701 - 04/26 0700 In: 515 [P.O.:240; I.V.:275] Out: 175 [Urine:175] Intake/Output this shift:    Lab Results: CBC  Recent Labs  05/14/15 1843 05/15/15 0229  05/16/15 0406 05/16/15 1147 05/17/15 0430  WBC 9.0 7.8  --   --   --  8.2  HGB 8.1* 7.2*  < > 9.6* 10.5* 10.7*  HCT 23.3* 20.9*  < > 27.2* 30.0* 31.4*  MCV 88.9 89.3  --   --   --  91.8  PLT 126* 117*  --   --   --  123*  < > = values in this interval not displayed. BMET  Recent Labs  05/14/15 0846 05/15/15 0229  NA 143 143  K 3.7 3.5  CL 114* 117*  CO2 20* 20*  GLUCOSE 177* 100*  BUN 37* 31*  CREATININE 1.21* 1.05*  CALCIUM 8.7* 8.3*   LFTs  Recent Labs  05/14/15 0846  BILITOT 0.6  ALKPHOS 33*  AST 26  ALT 15  PROT 5.0*  ALBUMIN 2.9*   No results for input(s): LIPASE in the last 72 hours. PT/INR  Recent Labs  05/14/15 0846 05/15/15 1140  LABPROT 40.9* 18.2*  INR 4.41* 1.50*      Imaging Studies: Dg Chest Port 1 View  05/14/2015  CLINICAL DATA:  weakness EXAM: PORTABLE CHEST - 1 VIEW COMPARISON:  01/19/2009 FINDINGS: Stable  left subclavian pacemaker. Moderate cardiomegaly. Large hiatal hernia with fluid level. Atheromatous aorta. Subsegmental atelectasis or early infiltrate at the left lung base partially obscures the left diaphragmatic leaflet. Can't exclude small left pleural effusion. No pneumothorax. Visualized skeletal structures are unremarkable. IMPRESSION: 1. Stable cardiomegaly 2. Large hiatal hernia 3. Left lower lobe patchy atelectasis versus early infiltrate. Can't exclude small left effusion. Electronically Signed   By: Corlis Leak M.D.   On: 05/14/2015 09:16  [2 weeks]   Assessment: 82 year old very pleasant female admitted with likely upper GI bleed in the setting of Xarelto for atrial fibrillation and ASA. Has received 4 units of prbcs this admission.    EGD 05/16/15: tortuous distal esophagus, medium sized hiatal hernia with Cameron's erosions, small paraesophageal hernia with ?incomplete gastric volvulus, nonbleeding antral erosions,    Plan: 1. F/u pending pathology. 2. Return office visit in 3 months. Patient will require at least monthly CBCs until then. 3. PPI BID for 3 months, then daily if ASA continued. 4. Consider stopping anticoagulation indefinitely. 5. Will sign off, call with questions.    LOS: 3 days    Leanna Battles. Dixon Boos [facility] [phone] 4/26/20178:43 AM

## 2015-05-17 NOTE — Care Management Note (Signed)
Case Management Note  Patient Details  Name: [name] MRN: [id number] Date of Birth: Dec 16, 1920  Subjective/Objective:    Spoke with patient and with two children present in the room Son and Daughter. Patient is from home with 24 hour family present. Family stated that they take turns staying with her. Patient has a walker and a wheelchair at home. Family takes her to appointments. Daughter stated that there are no issues with obtaining medications.  PT has recommended HH and patient and family offered choice. Family decided on [facility]. No other Cm needs anticipated.              Action/Plan:Home with Home Health.   Expected Discharge Date:                  Expected Discharge Plan:  Home w Home Health Services  In-House Referral:  NA  Discharge planning Services  CM Consult  Post Acute Care Choice:  Home Health Choice offered to:  Patient  DME Arranged:    DME Agency:     HH Arranged:    HH Agency:     Status of Service:  In process, will continue to follow  Medicare Important Message Given:    Date Medicare IM Given:    Medicare IM give by:    Date Additional Medicare IM Given:    Additional Medicare Important Message give by:     If discussed at Long Length of Stay Meetings, dates discussed:    Additional Comments:  Adonis Huguenin, RN 05/17/2015, 12:21 PM

## 2015-05-17 NOTE — Telephone Encounter (Signed)
Patient needs to have CBC in 2 weeks, then monthly X 3 for anemia, gi bleed. Needs OV in 3 months with RMR.

## 2015-05-17 NOTE — Progress Notes (Signed)
PT Cancellation Note  Patient Details Name: MILISSIA HUTZEL MRN: [id number] DOB: 1921-01-20   Cancelled Treatment:    Reason Eval/Treat Not Completed: Other (comment).  Attempted to see pt x2 this AM, however pt initially with physician, and then eating breakfast.  Will continue to check back to perform initial evaluation.    Beth Cleotilde Spadaccini, PT, DPT X: [phone]   05/17/2015, 8:48 AM

## 2015-05-17 NOTE — Progress Notes (Signed)
Asked to place second vascular access. Attempted placement but failed twice with 20 gauge IV catheter.

## 2015-05-18 ENCOUNTER — Other Ambulatory Visit: Payer: Self-pay

## 2015-05-18 DIAGNOSIS — D649 Anemia, unspecified: Secondary | ICD-10-CM

## 2015-05-18 LAB — CBC
HCT: 25.5 % — ABNORMAL LOW (ref 36.0–46.0)
Hemoglobin: 8.8 g/dL — ABNORMAL LOW (ref 12.0–15.0)
MCH: 31.8 pg (ref 26.0–34.0)
MCHC: 34.5 g/dL (ref 30.0–36.0)
MCV: 92.1 fL (ref 78.0–100.0)
PLATELETS: 137 10*3/uL — AB (ref 150–400)
RBC: 2.77 MIL/uL — AB (ref 3.87–5.11)
RDW: 16.3 % — AB (ref 11.5–15.5)
WBC: 7 10*3/uL (ref 4.0–10.5)

## 2015-05-18 NOTE — Progress Notes (Signed)
Subjective: Patient is resting. She is tolerating her diet. No melena or rectal bleed. However, her HGB dropped from 10.7 to 8.8  Objective: Vital signs in last 24 hours: Temp:  [97.8 F (36.6 C)-98.1 F (36.7 C)] 98.1 F (36.7 C) (04/27 0500) Pulse Rate:  [45-83] 75 (04/27 0500) Resp:  [18-20] 18 (04/27 0500) BP: (104-137)/(64-66) 122/65 mmHg (04/27 0500) SpO2:  [98 %-100 %] 100 % (04/27 0500) Weight change:  Last BM Date: 05/17/15  Intake/Output from previous day: 04/26 0701 - 04/27 0700 In: 1074.2 [P.O.:720; I.V.:354.2] Out: 1000 [Urine:1000]  PHYSICAL EXAM General appearance: no distress and slowed mentation Resp: clear to auscultation bilaterally Cardio: regularly irregular rhythm GI: soft, non-tender; bowel sounds normal; no masses,  no organomegaly Extremities: extremities normal, atraumatic, no cyanosis or edema  Lab Results:  Results for orders placed or performed during the hospital encounter of 05/14/15 (from the past 48 hour(s))  MRSA PCR Screening     Status: None   Collection Time: 05/16/15  9:30 AM  Result Value Ref Range   MRSA by PCR NEGATIVE NEGATIVE    Comment:        The GeneXpert MRSA Assay (FDA approved for NASAL specimens only), is one component of a comprehensive MRSA colonization surveillance program. It is not intended to diagnose MRSA infection nor to guide or monitor treatment for MRSA infections.   Hemoglobin and hematocrit, blood     Status: Abnormal   Collection Time: 05/16/15 11:47 AM  Result Value Ref Range   Hemoglobin 10.5 (L) 12.0 - 15.0 g/dL   HCT 38.1 (L) 84.0 - 37.5 %  CBC     Status: Abnormal   Collection Time: 05/17/15  4:30 AM  Result Value Ref Range   WBC 8.2 4.0 - 10.5 K/uL   RBC 3.42 (L) 3.87 - 5.11 MIL/uL   Hemoglobin 10.7 (L) 12.0 - 15.0 g/dL   HCT 43.6 (L) 06.7 - 70.3 %   MCV 91.8 78.0 - 100.0 fL   MCH 31.3 26.0 - 34.0 pg   MCHC 34.1 30.0 - 36.0 g/dL   RDW 40.3 (H) 52.4 - 81.8 %   Platelets 123 (L) 150 - 400  K/uL  CBC     Status: Abnormal   Collection Time: 05/18/15  4:21 AM  Result Value Ref Range   WBC 7.0 4.0 - 10.5 K/uL   RBC 2.77 (L) 3.87 - 5.11 MIL/uL   Hemoglobin 8.8 (L) 12.0 - 15.0 g/dL   HCT 59.0 (L) 93.1 - 12.1 %   MCV 92.1 78.0 - 100.0 fL   MCH 31.8 26.0 - 34.0 pg   MCHC 34.5 30.0 - 36.0 g/dL   RDW 62.4 (H) 46.9 - 50.7 %   Platelets 137 (L) 150 - 400 K/uL    ABGS No results for input(s): PHART, PO2ART, TCO2, HCO3 in the last 72 hours.  Invalid input(s): PCO2 CULTURES Recent Results (from the past 240 hour(s))  MRSA PCR Screening     Status: None   Collection Time: 05/16/15  9:30 AM  Result Value Ref Range Status   MRSA by PCR NEGATIVE NEGATIVE Final    Comment:        The GeneXpert MRSA Assay (FDA approved for NASAL specimens only), is one component of a comprehensive MRSA colonization surveillance program. It is not intended to diagnose MRSA infection nor to guide or monitor treatment for MRSA infections.    Studies/Results: No results found.  Medications: I have reviewed the patient's current medications.  Assesment:  Principal Problem:   Severe anemia Active Problems:   Essential hypertension, benign   Atrial fibrillation (HCC)   Melena   Weakness generalized   GI bleed   Acute GI bleeding    Plan:  Medications reviewed Will repeat CBC and if no further in HGB will plan for discharge in AM  Physical theray    LOS: 4 days   Revonda Menter 05/18/2015, 8:09 AM

## 2015-05-18 NOTE — Telephone Encounter (Signed)
Lab orders done. First one mailed to the pt, the others are on file.

## 2015-05-18 NOTE — Consult Note (Signed)
   [facility] CM Inpatient Consult   05/18/2015  [name] May 05, 1920 [id number]   Spoke with this very pleasant patient, her son and 2 family friends at bedside regarding [facility] services. Patient reports she has a lot of support from her family and is looking forward to going home with current support system in place. Patient does not wish to participate with [facility] at this time, however she appreciates information and will call if her needs change after discharge. Patient given [facility] brochure and contact information for future reference.  Of note, [facility] Care Management services would not replace or interfere with any services that are arranged by inpatient case management or social work. For additional questions or referrals please contact:  [name]. Ashley Ghee, RN, BSN, [facility]  [facility] Liaison [phone]

## 2015-05-18 NOTE — Progress Notes (Signed)
Physical Therapy Treatment Patient Details Name: [name] MRN: [id number] DOB: [birth year] Today's Date: 05/18/2015    History of Present Illness 82 yo F admitted with melena/anemia.  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  Pt also with new c/o L foot pain upon awakening today.  H&H today: 10.7/31.4    PT Comments    Pt received in bed, and expressed need to use the bathroom.  Dtr present.  Pt was able to transfer bed<>BSC with Min A and RW, and then transferred BSC<>chair with RW and Min A.  Pt continues to have difficulty with further ambulation due to increased pain in L foot, which inhibits her weight bearing on the left.  Pt also seems to be SOB with transfer, therefore further mobility not assessed.  Continued to encourage pt on HHPT at d/c.  Dtr stated that there would be someone to care for her 24/7.   Follow Up Recommendations  Home health PT;Supervision/Assistance - 24 hour     Equipment Recommendations  None recommended by PT    Recommendations for Other Services       Precautions / Restrictions Restrictions Weight Bearing Restrictions: No    Mobility  Bed Mobility Overal bed mobility: Needs Assistance Bed Mobility: Supine to Sit     Supine to sit: Supervision;HOB elevated        Transfers Overall transfer level: Needs assistance Equipment used: Rolling walker (2 wheeled) Transfers: Sit to/from [employer] Sit to Stand: Min assist Stand pivot transfers: Min assist       General transfer comment: Pt was able to transfer from bed<>BSC.  She voided, and stood to perform hygiene.  Pt then able to take a few steps from the BSC<>chair, however she seemed to be SOB, and was c/o increased L foot pain with difficulty advancing L foot, therefore further ambulation not tested.   Ambulation/Gait                 Stairs            Wheelchair Mobility    Modified Rankin (Stroke Patients Only)        Balance           Standing balance support: Bilateral upper extremity supported Standing balance-Leahy Scale: Fair                      Cognition Arousal/Alertness: Awake/alert Behavior During Therapy: WFL for tasks assessed/performed Overall Cognitive Status: Within Functional Limits for tasks assessed                      Exercises      General Comments        Pertinent Vitals/Pain Pain Location: Pt states that her L foot is feeling better, but then when she is standing, she has difficulty putting weight through the L LE.     Home Living                      Prior Function            PT Goals (current goals can now be found in the care plan section) Acute Rehab PT Goals Patient Stated Goal: Pt wants to go home and be as independent as she was previously.  PT Goal Formulation: With patient/family Time For Goal Achievement: 05/31/15 Potential to Achieve Goals: Fair Progress towards PT goals: Progressing toward goals    Frequency  Min  5X/week    PT Plan Current plan remains appropriate    Co-evaluation             End of Session Equipment Utilized During Treatment: Gait belt Activity Tolerance: Patient limited by pain;Patient limited by fatigue Patient left: in chair;with call bell/phone within reach;with family/visitor present     Time: 4540-9811 PT Time Calculation (min) (ACUTE ONLY): 19 min  Charges:  $Therapeutic Activity: 8-22 mins                    G Codes:      [name], PT, DPT X: [phone]   05/18/2015, 4:31 PM

## 2015-05-18 NOTE — Telephone Encounter (Signed)
Please schedule ov with RMR. 

## 2015-05-19 LAB — CBC
HEMATOCRIT: 24.6 % — AB (ref 36.0–46.0)
Hemoglobin: 8.5 g/dL — ABNORMAL LOW (ref 12.0–15.0)
MCH: 31.7 pg (ref 26.0–34.0)
MCHC: 34.6 g/dL (ref 30.0–36.0)
MCV: 91.8 fL (ref 78.0–100.0)
PLATELETS: 138 10*3/uL — AB (ref 150–400)
RBC: 2.68 MIL/uL — ABNORMAL LOW (ref 3.87–5.11)
RDW: 16.6 % — AB (ref 11.5–15.5)
WBC: 6.5 10*3/uL (ref 4.0–10.5)

## 2015-05-19 MED ORDER — PANTOPRAZOLE SODIUM 40 MG PO TBEC: 40.0000 mg | DELAYED_RELEASE_TABLET | Freq: Two times a day (BID) | ORAL | Status: DC

## 2015-05-19 NOTE — Care Management (Signed)
Spoke with duahger [name] who is interested in hiring a sitter for patient .   Gave list of area providers .  Notified [facility] of discharge today.

## 2015-05-19 NOTE — Care Management Important Message (Signed)
Important Message  Patient Details  Name: [name] MRN: [id number] Date of Birth: [birth year]   Medicare Important Message Given:  Yes    Adonis Huguenin, RN 05/19/2015, 9:18 AM

## 2015-05-19 NOTE — Discharge Summary (Signed)
Physician Discharge Summary  Patient ID: [name] MRN: [id number] DOB/AGE: Dec 18, 1920 82 y.o. Primary Care Physician:Schawn Byas, MD Admit date: 05/14/2015 Discharge date: 05/19/2015    Discharge Diagnoses:  Principal Problem:   Severe anemia Active Problems:   Essential hypertension, benign   Atrial fibrillation (HCC)   Melena   Weakness generalized   GI bleed   Acute GI bleeding chronic atrial fibrillation    Medication List    STOP taking these medications        aspirin 81 MG tablet     hydrochlorothiazide 25 MG tablet  Commonly known as:  HYDRODIURIL     Rivaroxaban 15 MG Tabs tablet  Commonly known as:  XARELTO      TAKE these medications        acetaminophen 650 MG CR tablet  Commonly known as:  TYLENOL  Take 650 mg by mouth every 8 (eight) hours as needed for pain.     amiodarone 200 MG tablet  Commonly known as:  PACERONE  Take 1 tablet (200 mg total) by mouth daily.     amLODipine 5 MG tablet  Commonly known as:  NORVASC  Take 1 tablet (5 mg total) by mouth daily.     calcium-vitamin D 500-200 MG-UNIT tablet  Commonly known as:  OSCAL WITH D  Take 1 tablet by mouth daily.     ferrous sulfate 325 (65 FE) MG EC tablet  Take 65 mg by mouth 3 (three) times daily with meals.     furosemide 40 MG tablet  Commonly known as:  LASIX  Take 1 tablet (40 mg total) by mouth daily.     lisinopril 40 MG tablet  Commonly known as:  PRINIVIL,ZESTRIL  Take 1 tablet (40 mg total) by mouth daily.     pantoprazole 40 MG tablet  Commonly known as:  PROTONIX  Take 1 tablet (40 mg total) by mouth 2 (two) times daily before a meal.     potassium chloride SA 20 MEQ tablet  Commonly known as:  K-DUR,KLOR-CON  Take 1 tablet (20 mEq total) by mouth daily.     pravastatin 40 MG tablet  Commonly known as:  PRAVACHOL  Take 40 mg by mouth daily.        Discharged Condition: improved    Consults: GI  Significant Diagnostic Studies: Dg Chest Port 1  View  05/14/2015  CLINICAL DATA:  weakness EXAM: PORTABLE CHEST - 1 VIEW COMPARISON:  01/19/2009 FINDINGS: Stable left subclavian pacemaker. Moderate cardiomegaly. Large hiatal hernia with fluid level. Atheromatous aorta. Subsegmental atelectasis or early infiltrate at the left lung base partially obscures the left diaphragmatic leaflet. Can't exclude small left pleural effusion. No pneumothorax. Visualized skeletal structures are unremarkable. IMPRESSION: 1. Stable cardiomegaly 2. Large hiatal hernia 3. Left lower lobe patchy atelectasis versus early infiltrate. Can't exclude small left effusion. Electronically Signed   By: Corlis Leak M.D.   On: 05/14/2015 09:16    Lab Results: Basic Metabolic Panel: No results for input(s): NA, K, CL, CO2, GLUCOSE, BUN, CREATININE, CALCIUM, MG, PHOS in the last 72 hours. Liver Function Tests: No results for input(s): AST, ALT, ALKPHOS, BILITOT, PROT, ALBUMIN in the last 72 hours.   CBC:  Recent Labs  05/18/15 0421 05/19/15 0448  WBC 7.0 6.5  HGB 8.8* 8.5*  HCT 25.5* 24.6*  MCV 92.1 91.8  PLT 137* 138*    Recent Results (from the past 240 hour(s))  MRSA PCR Screening     Status: None  Collection Time: 05/16/15  9:30 AM  Result Value Ref Range Status   MRSA by PCR NEGATIVE NEGATIVE Final    Comment:        The GeneXpert MRSA Assay (FDA approved for NASAL specimens only), is one component of a comprehensive MRSA colonization surveillance program. It is not intended to diagnose MRSA infection nor to guide or monitor treatment for MRSA infections.      Hospital Course:   This is a 82 years old female patient with history of multiple medical illnesses was admitted due to severe anemia. Her Hb was 5.3. Her stool occult blood was ++. Patient was transfused and was evaluated by GI. She had EDG which was didn't show any acute bleeding. Her anticoagulation was stopped according to GI recommendation. She will be discharged on protonox BID and will  monitor CBC in out patient.   Discharge Exam: Blood pressure 136/78, pulse 91, temperature 98.1 F (36.7 C), temperature source Oral, resp. rate 20, height 5\' 3"  (1.6 m), weight 68.947 kg (152 lb), SpO2 100 %.    Disposition:  home      Signed: Brazos Sandoval   05/19/2015, 8:12 AM

## 2015-05-19 NOTE — Progress Notes (Signed)
Discharge instructions given to daughter who verbalized understanding, out in stable condition via w/c with staff. 

## 2015-05-22 ENCOUNTER — Inpatient Hospital Stay (HOSPITAL_COMMUNITY)
Admission: EM | Admit: 2015-05-22 | Discharge: 2015-06-03 | DRG: 177 | Disposition: A | Discharge status: Hospice | Payer: Medicare Other | Attending: Internal Medicine | Admitting: Internal Medicine

## 2015-05-22 ENCOUNTER — Encounter (HOSPITAL_COMMUNITY): Payer: Self-pay | Admitting: Gastroenterology

## 2015-05-22 DIAGNOSIS — M810 Age-related osteoporosis without current pathological fracture: Secondary | ICD-10-CM | POA: Diagnosis present

## 2015-05-22 DIAGNOSIS — I110 Hypertensive heart disease with heart failure: Secondary | ICD-10-CM | POA: Diagnosis present

## 2015-05-22 DIAGNOSIS — E876 Hypokalemia: Secondary | ICD-10-CM | POA: Diagnosis present

## 2015-05-22 DIAGNOSIS — Z800 Family history of malignant neoplasm of digestive organs: Secondary | ICD-10-CM

## 2015-05-22 DIAGNOSIS — K922 Gastrointestinal hemorrhage, unspecified: Secondary | ICD-10-CM | POA: Diagnosis present

## 2015-05-22 DIAGNOSIS — E785 Hyperlipidemia, unspecified: Secondary | ICD-10-CM | POA: Diagnosis present

## 2015-05-22 DIAGNOSIS — Z950 Presence of cardiac pacemaker: Secondary | ICD-10-CM

## 2015-05-22 DIAGNOSIS — Z515 Encounter for palliative care: Secondary | ICD-10-CM | POA: Insufficient documentation

## 2015-05-22 DIAGNOSIS — J9621 Acute and chronic respiratory failure with hypoxia: Secondary | ICD-10-CM | POA: Diagnosis not present

## 2015-05-22 DIAGNOSIS — E440 Moderate protein-calorie malnutrition: Secondary | ICD-10-CM | POA: Diagnosis not present

## 2015-05-22 DIAGNOSIS — J690 Pneumonitis due to inhalation of food and vomit: Secondary | ICD-10-CM | POA: Diagnosis not present

## 2015-05-22 DIAGNOSIS — R627 Adult failure to thrive: Secondary | ICD-10-CM | POA: Diagnosis present

## 2015-05-22 DIAGNOSIS — J9801 Acute bronchospasm: Secondary | ICD-10-CM

## 2015-05-22 DIAGNOSIS — R0989 Other specified symptoms and signs involving the circulatory and respiratory systems: Secondary | ICD-10-CM | POA: Diagnosis not present

## 2015-05-22 DIAGNOSIS — Z6827 Body mass index (BMI) 27.0-27.9, adult: Secondary | ICD-10-CM

## 2015-05-22 DIAGNOSIS — J90 Pleural effusion, not elsewhere classified: Secondary | ICD-10-CM | POA: Diagnosis not present

## 2015-05-22 DIAGNOSIS — K5900 Constipation, unspecified: Secondary | ICD-10-CM | POA: Diagnosis present

## 2015-05-22 DIAGNOSIS — I5033 Acute on chronic diastolic (congestive) heart failure: Secondary | ICD-10-CM | POA: Diagnosis not present

## 2015-05-22 DIAGNOSIS — D649 Anemia, unspecified: Secondary | ICD-10-CM | POA: Diagnosis present

## 2015-05-22 DIAGNOSIS — G4730 Sleep apnea, unspecified: Secondary | ICD-10-CM | POA: Diagnosis present

## 2015-05-22 DIAGNOSIS — Y95 Nosocomial condition: Secondary | ICD-10-CM

## 2015-05-22 DIAGNOSIS — R0682 Tachypnea, not elsewhere classified: Secondary | ICD-10-CM | POA: Diagnosis not present

## 2015-05-22 DIAGNOSIS — T17308A Unspecified foreign body in larynx causing other injury, initial encounter: Secondary | ICD-10-CM

## 2015-05-22 DIAGNOSIS — I4891 Unspecified atrial fibrillation: Secondary | ICD-10-CM | POA: Diagnosis present

## 2015-05-22 DIAGNOSIS — R05 Cough: Secondary | ICD-10-CM | POA: Diagnosis not present

## 2015-05-22 DIAGNOSIS — R0602 Shortness of breath: Secondary | ICD-10-CM | POA: Diagnosis not present

## 2015-05-22 DIAGNOSIS — Z96649 Presence of unspecified artificial hip joint: Secondary | ICD-10-CM | POA: Diagnosis present

## 2015-05-22 DIAGNOSIS — Z7189 Other specified counseling: Secondary | ICD-10-CM | POA: Insufficient documentation

## 2015-05-22 DIAGNOSIS — J189 Pneumonia, unspecified organism: Secondary | ICD-10-CM

## 2015-05-22 DIAGNOSIS — I482 Chronic atrial fibrillation: Secondary | ICD-10-CM | POA: Diagnosis not present

## 2015-05-22 DIAGNOSIS — I2510 Atherosclerotic heart disease of native coronary artery without angina pectoris: Secondary | ICD-10-CM | POA: Diagnosis present

## 2015-05-22 DIAGNOSIS — T17908A Unspecified foreign body in respiratory tract, part unspecified causing other injury, initial encounter: Secondary | ICD-10-CM

## 2015-05-22 DIAGNOSIS — R011 Cardiac murmur, unspecified: Secondary | ICD-10-CM | POA: Diagnosis present

## 2015-05-22 NOTE — ED Notes (Signed)
Pt reports to EMS that she "aspirated at breakfast". Pt has pacemaker, HR is in the 40s per ems, O2 saturation is 98% on RA, pt has wet cough.

## 2015-05-23 ENCOUNTER — Telehealth: Payer: Self-pay | Admitting: *Deleted

## 2015-05-23 ENCOUNTER — Encounter (HOSPITAL_COMMUNITY): Payer: Self-pay | Admitting: Internal Medicine

## 2015-05-23 ENCOUNTER — Emergency Department (HOSPITAL_COMMUNITY): Payer: Medicare Other

## 2015-05-23 ENCOUNTER — Encounter: Payer: Self-pay | Admitting: Cardiovascular Disease

## 2015-05-23 DIAGNOSIS — Z6827 Body mass index (BMI) 27.0-27.9, adult: Secondary | ICD-10-CM | POA: Diagnosis not present

## 2015-05-23 DIAGNOSIS — J690 Pneumonitis due to inhalation of food and vomit: Secondary | ICD-10-CM | POA: Diagnosis present

## 2015-05-23 DIAGNOSIS — I2510 Atherosclerotic heart disease of native coronary artery without angina pectoris: Secondary | ICD-10-CM | POA: Diagnosis present

## 2015-05-23 DIAGNOSIS — R531 Weakness: Secondary | ICD-10-CM

## 2015-05-23 DIAGNOSIS — Z7401 Bed confinement status: Secondary | ICD-10-CM | POA: Diagnosis not present

## 2015-05-23 DIAGNOSIS — M810 Age-related osteoporosis without current pathological fracture: Secondary | ICD-10-CM | POA: Diagnosis present

## 2015-05-23 DIAGNOSIS — E785 Hyperlipidemia, unspecified: Secondary | ICD-10-CM | POA: Diagnosis present

## 2015-05-23 DIAGNOSIS — Z800 Family history of malignant neoplasm of digestive organs: Secondary | ICD-10-CM | POA: Diagnosis not present

## 2015-05-23 DIAGNOSIS — I482 Chronic atrial fibrillation: Secondary | ICD-10-CM | POA: Diagnosis present

## 2015-05-23 DIAGNOSIS — I509 Heart failure, unspecified: Secondary | ICD-10-CM | POA: Diagnosis not present

## 2015-05-23 DIAGNOSIS — J441 Chronic obstructive pulmonary disease with (acute) exacerbation: Secondary | ICD-10-CM | POA: Diagnosis not present

## 2015-05-23 DIAGNOSIS — J189 Pneumonia, unspecified organism: Secondary | ICD-10-CM | POA: Diagnosis not present

## 2015-05-23 DIAGNOSIS — J90 Pleural effusion, not elsewhere classified: Secondary | ICD-10-CM | POA: Diagnosis not present

## 2015-05-23 DIAGNOSIS — I5033 Acute on chronic diastolic (congestive) heart failure: Secondary | ICD-10-CM | POA: Diagnosis present

## 2015-05-23 DIAGNOSIS — R627 Adult failure to thrive: Secondary | ICD-10-CM | POA: Diagnosis present

## 2015-05-23 DIAGNOSIS — E440 Moderate protein-calorie malnutrition: Secondary | ICD-10-CM | POA: Diagnosis present

## 2015-05-23 DIAGNOSIS — Z950 Presence of cardiac pacemaker: Secondary | ICD-10-CM | POA: Diagnosis not present

## 2015-05-23 DIAGNOSIS — Z515 Encounter for palliative care: Secondary | ICD-10-CM

## 2015-05-23 DIAGNOSIS — Z7189 Other specified counseling: Secondary | ICD-10-CM | POA: Diagnosis not present

## 2015-05-23 DIAGNOSIS — J9621 Acute and chronic respiratory failure with hypoxia: Secondary | ICD-10-CM | POA: Diagnosis present

## 2015-05-23 DIAGNOSIS — Y95 Nosocomial condition: Secondary | ICD-10-CM

## 2015-05-23 DIAGNOSIS — D649 Anemia, unspecified: Secondary | ICD-10-CM | POA: Diagnosis present

## 2015-05-23 DIAGNOSIS — G4730 Sleep apnea, unspecified: Secondary | ICD-10-CM | POA: Diagnosis present

## 2015-05-23 DIAGNOSIS — R011 Cardiac murmur, unspecified: Secondary | ICD-10-CM | POA: Diagnosis not present

## 2015-05-23 DIAGNOSIS — K5900 Constipation, unspecified: Secondary | ICD-10-CM | POA: Diagnosis present

## 2015-05-23 DIAGNOSIS — Z96649 Presence of unspecified artificial hip joint: Secondary | ICD-10-CM | POA: Diagnosis present

## 2015-05-23 DIAGNOSIS — E876 Hypokalemia: Secondary | ICD-10-CM | POA: Diagnosis present

## 2015-05-23 DIAGNOSIS — R0602 Shortness of breath: Secondary | ICD-10-CM | POA: Diagnosis present

## 2015-05-23 DIAGNOSIS — I4891 Unspecified atrial fibrillation: Secondary | ICD-10-CM | POA: Diagnosis not present

## 2015-05-23 DIAGNOSIS — Z9981 Dependence on supplemental oxygen: Secondary | ICD-10-CM | POA: Diagnosis not present

## 2015-05-23 DIAGNOSIS — I110 Hypertensive heart disease with heart failure: Secondary | ICD-10-CM | POA: Diagnosis present

## 2015-05-23 LAB — CBC WITH DIFFERENTIAL/PLATELET
BASOS ABS: 0 10*3/uL (ref 0.0–0.1)
BASOS PCT: 0 %
Eosinophils Absolute: 0.1 10*3/uL (ref 0.0–0.7)
Eosinophils Relative: 2 %
HEMATOCRIT: 27 % — AB (ref 36.0–46.0)
Hemoglobin: 9.1 g/dL — ABNORMAL LOW (ref 12.0–15.0)
Lymphocytes Relative: 10 %
Lymphs Abs: 0.7 10*3/uL (ref 0.7–4.0)
MCH: 31.6 pg (ref 26.0–34.0)
MCHC: 33.7 g/dL (ref 30.0–36.0)
MCV: 93.8 fL (ref 78.0–100.0)
MONO ABS: 0.8 10*3/uL (ref 0.1–1.0)
Monocytes Relative: 10 %
NEUTROS ABS: 6 10*3/uL (ref 1.7–7.7)
Neutrophils Relative %: 78 %
Platelets: 180 10*3/uL (ref 150–400)
RBC: 2.88 MIL/uL — ABNORMAL LOW (ref 3.87–5.11)
RDW: 15.7 % — AB (ref 11.5–15.5)
WBC: 7.6 10*3/uL (ref 4.0–10.5)

## 2015-05-23 LAB — BASIC METABOLIC PANEL
Anion gap: 9 (ref 5–15)
BUN: 9 mg/dL (ref 6–20)
CHLORIDE: 103 mmol/L (ref 101–111)
CO2: 28 mmol/L (ref 22–32)
CREATININE: 0.97 mg/dL (ref 0.44–1.00)
Calcium: 8.3 mg/dL — ABNORMAL LOW (ref 8.9–10.3)
GFR calc Af Amer: 57 mL/min — ABNORMAL LOW (ref >60)
GFR calc non Af Amer: 49 mL/min — ABNORMAL LOW (ref >60)
GLUCOSE: 116 mg/dL — AB (ref 65–99)
POTASSIUM: 2.8 mmol/L — AB (ref 3.5–5.1)
SODIUM: 140 mmol/L (ref 135–145)

## 2015-05-23 MED ORDER — PANTOPRAZOLE SODIUM 40 MG PO TBEC
40.0000 mg | DELAYED_RELEASE_TABLET | Freq: Two times a day (BID) | ORAL | Status: DC
  Administered 2015-05-23 – 2015-06-03 (×23): 40 mg via ORAL
  Filled 2015-05-23 (×23): qty 1

## 2015-05-23 MED ORDER — DEXTROSE-NACL 5-0.9 % IV SOLN
INTRAVENOUS | Status: DC
  Administered 2015-05-23 – 2015-05-27 (×4): via INTRAVENOUS

## 2015-05-23 MED ORDER — LEVALBUTEROL HCL 0.63 MG/3ML IN NEBU
0.6300 mg | INHALATION_SOLUTION | Freq: Four times a day (QID) | RESPIRATORY_TRACT | Status: DC
  Administered 2015-05-23 (×3): 0.63 mg via RESPIRATORY_TRACT
  Filled 2015-05-23 (×3): qty 3

## 2015-05-23 MED ORDER — AMLODIPINE BESYLATE 5 MG PO TABS
5.0000 mg | ORAL_TABLET | Freq: Every day | ORAL | Status: DC
  Administered 2015-05-23 – 2015-06-03 (×12): 5 mg via ORAL
  Filled 2015-05-23 (×12): qty 1

## 2015-05-23 MED ORDER — PIPERACILLIN-TAZOBACTAM 3.375 G IVPB
3.3750 g | Freq: Three times a day (TID) | INTRAVENOUS | Status: DC
  Administered 2015-05-23 – 2015-06-03 (×35): 3.375 g via INTRAVENOUS
  Filled 2015-05-23 (×31): qty 50

## 2015-05-23 MED ORDER — PRAVASTATIN SODIUM 40 MG PO TABS
40.0000 mg | ORAL_TABLET | Freq: Every day | ORAL | Status: DC
  Administered 2015-05-23 – 2015-06-03 (×12): 40 mg via ORAL
  Filled 2015-05-23 (×12): qty 1

## 2015-05-23 MED ORDER — AMIODARONE HCL 200 MG PO TABS
200.0000 mg | ORAL_TABLET | Freq: Every day | ORAL | Status: DC
  Administered 2015-05-23 – 2015-06-03 (×12): 200 mg via ORAL
  Filled 2015-05-23 (×12): qty 1

## 2015-05-23 MED ORDER — ONDANSETRON HCL 4 MG/2ML IJ SOLN: 4.0000 mg | Freq: Four times a day (QID) | INTRAMUSCULAR | Status: DC | PRN

## 2015-05-23 MED ORDER — GUAIFENESIN-CODEINE 100-10 MG/5ML PO SOLN
5.0000 mL | ORAL | Status: DC | PRN
  Administered 2015-05-23: 5 mL via ORAL
  Filled 2015-05-23: qty 5

## 2015-05-23 MED ORDER — LEVALBUTEROL HCL 1.25 MG/0.5ML IN NEBU
1.2500 mg | INHALATION_SOLUTION | Freq: Once | RESPIRATORY_TRACT | Status: AC
Start: 2015-05-23 — End: 2015-05-23
  Administered 2015-05-23: 1.25 mg via RESPIRATORY_TRACT
  Filled 2015-05-23: qty 0.5

## 2015-05-23 MED ORDER — LEVALBUTEROL HCL 0.63 MG/3ML IN NEBU
0.6300 mg | INHALATION_SOLUTION | Freq: Two times a day (BID) | RESPIRATORY_TRACT | Status: DC
  Administered 2015-05-24 – 2015-06-03 (×20): 0.63 mg via RESPIRATORY_TRACT
  Filled 2015-05-23 (×20): qty 3

## 2015-05-23 MED ORDER — VANCOMYCIN HCL IN DEXTROSE 1-5 GM/200ML-% IV SOLN
1000.0000 mg | INTRAVENOUS | Status: DC
  Administered 2015-05-23 – 2015-05-26 (×4): 1000 mg via INTRAVENOUS
  Filled 2015-05-23 (×4): qty 200

## 2015-05-23 MED ORDER — ALPRAZOLAM 0.25 MG PO TABS
0.2500 mg | ORAL_TABLET | Freq: Two times a day (BID) | ORAL | Status: DC | PRN
  Administered 2015-05-24 – 2015-06-01 (×8): 0.25 mg via ORAL
  Filled 2015-05-23 (×9): qty 1

## 2015-05-23 MED ORDER — ONDANSETRON HCL 4 MG PO TABS: 4.0000 mg | ORAL_TABLET | Freq: Four times a day (QID) | ORAL | Status: DC | PRN

## 2015-05-23 MED ORDER — VANCOMYCIN HCL IN DEXTROSE 1-5 GM/200ML-% IV SOLN
1000.0000 mg | Freq: Once | INTRAVENOUS | Status: AC
  Administered 2015-05-23: 1000 mg via INTRAVENOUS
  Filled 2015-05-23: qty 200

## 2015-05-23 MED ORDER — GUAIFENESIN-CODEINE 100-10 MG/5ML PO SOLN
5.0000 mL | Freq: Four times a day (QID) | ORAL | Status: DC
  Administered 2015-05-23 – 2015-05-25 (×12): 5 mL via ORAL
  Filled 2015-05-23 (×12): qty 5

## 2015-05-23 MED ORDER — DEXTROSE 5 % IV SOLN
1.0000 g | Freq: Once | INTRAVENOUS | Status: AC
  Administered 2015-05-23: 1 g via INTRAVENOUS
  Filled 2015-05-23: qty 1

## 2015-05-23 MED ORDER — IPRATROPIUM BROMIDE 0.02 % IN SOLN
0.5000 mg | Freq: Once | RESPIRATORY_TRACT | Status: AC
  Administered 2015-05-23: 0.5 mg via RESPIRATORY_TRACT
  Filled 2015-05-23: qty 2.5

## 2015-05-23 MED ORDER — LISINOPRIL 10 MG PO TABS
40.0000 mg | ORAL_TABLET | Freq: Every day | ORAL | Status: DC
  Administered 2015-05-23 – 2015-06-03 (×12): 40 mg via ORAL
  Filled 2015-05-23 (×12): qty 4

## 2015-05-23 MED ORDER — POTASSIUM CHLORIDE 10 MEQ/100ML IV SOLN
10.0000 meq | INTRAVENOUS | Status: AC
  Administered 2015-05-23 (×5): 10 meq via INTRAVENOUS
  Filled 2015-05-23: qty 100

## 2015-05-23 NOTE — Progress Notes (Signed)
Subjective: Patient was readmitted due to aspiration pneumonia and hypokalemia. Patient is overall declining and has difficulty to swallow. She has recurrnet cough.  Objective: Vital signs in last 24 hours: Temp:  [98.4 F (36.9 C)-98.6 F (37 C)] 98.4 F (36.9 C) (05/02 0404) Pulse Rate:  [59-102] 73 (05/02 0404) Resp:  [16-22] 22 (05/02 0300) BP: (110-134)/(50-91) 121/50 mmHg (05/02 0404) SpO2:  [97 %-100 %] 100 % (05/02 0404) Weight:  [69.8 kg (153 lb 14.1 oz)] 69.8 kg (153 lb 14.1 oz) (05/02 0404) Weight change:  Last BM Date: 05/22/15  Intake/Output from previous day:    PHYSICAL EXAM General appearance: mild distress and slowed mentation Resp: diminished breath sounds bilaterally and rhonchi bilaterally Cardio: irregularly irregular rhythm GI: soft, non-tender; bowel sounds normal; no masses,  no organomegaly Extremities: extremities normal, atraumatic, no cyanosis or edema  Lab Results:  Results for orders placed or performed during the hospital encounter of 05/22/15 (from the past 48 hour(s))  CBC with Differential     Status: Abnormal   Collection Time: 05/23/15 12:26 AM  Result Value Ref Range   WBC 7.6 4.0 - 10.5 K/uL   RBC 2.88 (L) 3.87 - 5.11 MIL/uL   Hemoglobin 9.1 (L) 12.0 - 15.0 g/dL   HCT 27.0 (L) 36.0 - 46.0 %   MCV 93.8 78.0 - 100.0 fL   MCH 31.6 26.0 - 34.0 pg   MCHC 33.7 30.0 - 36.0 g/dL   RDW 15.7 (H) 11.5 - 15.5 %   Platelets 180 150 - 400 K/uL   Neutrophils Relative % 78 %   Neutro Abs 6.0 1.7 - 7.7 K/uL   Lymphocytes Relative 10 %   Lymphs Abs 0.7 0.7 - 4.0 K/uL   Monocytes Relative 10 %   Monocytes Absolute 0.8 0.1 - 1.0 K/uL   Eosinophils Relative 2 %   Eosinophils Absolute 0.1 0.0 - 0.7 K/uL   Basophils Relative 0 %   Basophils Absolute 0.0 0.0 - 0.1 K/uL  Basic metabolic panel     Status: Abnormal   Collection Time: 05/23/15 12:26 AM  Result Value Ref Range   Sodium 140 135 - 145 mmol/L   Potassium 2.8 (L) 3.5 - 5.1 mmol/L   Chloride 103 101 - 111 mmol/L   CO2 28 22 - 32 mmol/L   Glucose, Bld 116 (H) 65 - 99 mg/dL   BUN 9 6 - 20 mg/dL   Creatinine, Ser 0.97 0.44 - 1.00 mg/dL   Calcium 8.3 (L) 8.9 - 10.3 mg/dL   GFR calc non Af Amer 49 (L) >60 mL/min   GFR calc Af Amer 57 (L) >60 mL/min    Comment: (NOTE) The eGFR has been calculated using the CKD EPI equation. This calculation has not been validated in all clinical situations. eGFR's persistently <60 mL/min signify possible Chronic Kidney Disease.    Anion gap 9 5 - 15    ABGS No results for input(s): PHART, PO2ART, TCO2, HCO3 in the last 72 hours.  Invalid input(s): PCO2 CULTURES Recent Results (from the past 240 hour(s))  MRSA PCR Screening     Status: None   Collection Time: 05/16/15  9:30 AM  Result Value Ref Range Status   MRSA by PCR NEGATIVE NEGATIVE Final    Comment:        The GeneXpert MRSA Assay (FDA approved for NASAL specimens only), is one component of a comprehensive MRSA colonization surveillance program. It is not intended to diagnose MRSA infection nor to guide or monitor  treatment for MRSA infections.    Studies/Results: Dg Chest Portable 1 View  05/23/2015  CLINICAL DATA:  Patient aspirated at breakfast yesterday and has a cough since then. Generalized weakness. EXAM: PORTABLE CHEST 1 VIEW COMPARISON:  05/14/2015 FINDINGS: Cardiac pacemaker. Diffuse enlargement of the cardiac silhouette which could indicate cardiac enlargement or pericardial effusion. No significant vascular congestion. There are small bilateral pleural effusions with atelectasis or infiltration suggested in both lung bases. Changes could indicate pneumonia. Large esophageal hiatal hernia behind the heart. Calcified and tortuous aorta. No pneumothorax. IMPRESSION: Enlarged cardiac silhouette. Small bilateral pleural effusions with basilar atelectasis or infiltration. Large esophageal hiatal hernia behind the heart. Electronically Signed   By: Lucienne Capers  M.D.   On: 05/23/2015 00:29    Medications: I have reviewed the patient's current medications.  Assesment:   Principal Problem:   Hospital acquired PNA Active Problems:   Cardiac pacemaker in situ   Systolic murmur   Atrial fibrillation (HCC)   Weakness generalized   GI bleed   HCAP (healthcare-associated pneumonia)    Plan:  Medications reviewed Continue iv antibiotics Speech consult Will supplement k+ Will do palliative care consult    LOS: 0 days   Dalinda Heidt 05/23/2015, 8:35 AM

## 2015-05-23 NOTE — H&P (Signed)
History and Physical    [name] DJT:[id number] DOB: 06-20-1920 DOA: 05/22/2015  Referring MD/NP/PA: Dr Devoria Albe PCP: Avon Gully, MD  Outpatient Specialists: Dr. Lewayne Bunting, cardiology  Patient coming from: Home   Chief Complaint: aspiration   HPI: [name] is a 82 y.o. female with medical history significant of DJD, HTN, systolic murmur, cardiac pacemaker, and atrial fibrillation presented with daughter and brother with complaints of SOB secondary to cough that onset around 9am this morning. Per daughter bedside she was having breakfast this morning and was noted to have intermittent cough through out the day. Daughter denies any swallow studies. She does report recent recurrence of issues with eating and drinking. 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 Daughter also reported a speech evaluation was recommended but has not yet been done.   ED Course: Potassium 2.8; WBC 7.6; Hgb 9.1; CXR with possible pneumonia. She has been referred for admission.   Review of Systems: As per HPI otherwise 10 point review of systems negative.    Past Medical History  Diagnosis Date  . DJD (degenerative joint disease)   . Osteoporosis   . Anxiety   . Sleep apnea   . Seasonal allergies   . Anemia   . Hypertension   . Atrial fibrillation [facility])     Past Surgical History  Procedure Laterality Date  . Pacemaker insertion    . Hemorrhoid surgery    . Cholecystectomy    . Total hip arthroplasty    . Esophagogastroduodenoscopy N/A 05/16/2015    Procedure: ESOPHAGOGASTRODUODENOSCOPY (EGD);  Surgeon: West Bali, MD;  Location: [facility];  Service: Endoscopy;  Laterality: N/A;     reports that she has never smoked. She does not have any smokeless tobacco history on file. She reports that she does not drink alcohol or use illicit drugs.  No Known Allergies  Family History  Problem Relation Age of Onset  .  Colon cancer Father   . Colon cancer Paternal Aunt   . Crohn's disease Son      Prior to Admission medications   Medication Sig Start Date End Date Taking? Authorizing Provider  acetaminophen (TYLENOL) 650 MG CR tablet Take 650 mg by mouth every 8 (eight) hours as needed for pain.     Historical Provider, MD  amiodarone (PACERONE) 200 MG tablet Take 1 tablet (200 mg total) by mouth daily. 05/01/15   Marinus Maw, MD  amLODipine (NORVASC) 5 MG tablet Take 1 tablet (5 mg total) by mouth daily. 05/25/14   Laqueta Linden, MD  calcium-vitamin D (OSCAL WITH D) 500-200 MG-UNIT per tablet Take 1 tablet by mouth daily.      Historical Provider, MD  ferrous sulfate 325 (65 FE) MG EC tablet Take 65 mg by mouth 3 (three) times daily with meals.    Historical Provider, MD  furosemide (LASIX) 40 MG tablet Take 1 tablet (40 mg total) by mouth daily. Patient taking differently: Take 20 mg by mouth daily. Decreased to 20 mg daily. May take an extra 20 mg if leg swelling/weight gain occurs. 03/08/15   Dyann Kief, PA-C  lisinopril (PRINIVIL,ZESTRIL) 40 MG tablet Take 1 tablet (40 mg total) by mouth daily. 04/08/12   Marinus Maw, MD  pantoprazole (PROTONIX) 40 MG tablet Take 1 tablet (40 mg total) by mouth 2 (two) times daily before a meal. 05/19/15   Avon Gully, MD  potassium chloride  SA (K-DUR,KLOR-CON) 20 MEQ tablet Take 1 tablet (20 mEq total) by mouth daily. 03/08/15   Dyann Kief, PA-C  pravastatin (PRAVACHOL) 40 MG tablet Take 40 mg by mouth daily.    Historical Provider, MD    Physical Exam: Filed Vitals:   05/23/15 0030 05/23/15 0100 05/23/15 0108 05/23/15 0130  BP: 134/55 110/91  129/86  Pulse: 102 76  75  Temp:      TempSrc:      Resp: 16 18  22   SpO2: 97% 99% 98% 100%      Constitutional: NAD, calm, comfortable Filed Vitals:   05/23/15 0030 05/23/15 0100 05/23/15 0108 05/23/15 0130  BP: 134/55 110/91  129/86  Pulse: 102 76  75  Temp:      TempSrc:      Resp: 16 18  22     SpO2: 97% 99% 98% 100%   Respiratory: clear to auscultation bilaterally, no wheezing, no crackles. Normal respiratory effort. No accessory muscle use.  Cardiovascular: Regular rate and rhythm. Murmurs. No rubs / gallops. No extremity edema. 2+ pedal pulses. No carotid bruits.  Abdomen: no tenderness, no masses palpated. No hepatosplenomegaly. Bowel sounds positive.  Musculoskeletal: no clubbing / cyanosis. No joint deformity upper and lower extremities. Good ROM, no contractures. Normal muscle tone.  Skin: no rashes, lesions, ulcers. No induration Neurologic: CN 2-12 grossly intact. Sensation intact, DTR normal. Strength 5/5 in all 4.  Psychiatric: Normal judgment and insight. Alert and oriented x 3. Normal mood.    Labs on Admission: I have personally reviewed following labs and imaging studies  CBC:  Recent Labs Lab 05/16/15 1147 05/17/15 0430 05/18/15 0421 05/19/15 0448 05/23/15 0026  WBC  --  8.2 7.0 6.5 7.6  NEUTROABS  --   --   --   --  6.0  HGB 10.5* 10.7* 8.8* 8.5* 9.1*  HCT 30.0* 31.4* 25.5* 24.6* 27.0*  MCV  --  91.8 92.1 91.8 93.8  PLT  --  123* 137* 138* 180   Basic Metabolic Panel:  Recent Labs Lab 05/23/15 0026  NA 140  K 2.8*  CL 103  CO2 28  GLUCOSE 116*  BUN 9  CREATININE 0.97  CALCIUM 8.3*   GFR: Estimated Creatinine Clearance: 33.7 mL/min (by C-G formula based on Cr of 0.97). Urine analysis:    Component Value Date/Time   COLORURINE YELLOW 11/10/2006 1331   APPEARANCEUR CLEAR 11/10/2006 1331   LABSPEC 1.020 11/10/2006 1331   PHURINE 6.0 11/10/2006 1331   GLUCOSEU NEGATIVE 11/10/2006 1331   HGBUR TRACE* 11/10/2006 1331   BILIRUBINUR NEGATIVE 11/10/2006 1331   KETONESUR NEGATIVE 11/10/2006 1331   PROTEINUR 30* 11/10/2006 1331   UROBILINOGEN 0.2 11/10/2006 1331   NITRITE NEGATIVE 11/10/2006 1331   LEUKOCYTESUR TRACE* 11/10/2006 1331   Sepsis Labs: @LABRCNTIP (procalcitonin:4,lacticidven:4) ) Recent Results (from the past 240 hour(s))   MRSA PCR Screening     Status: None   Collection Time: 05/16/15  9:30 AM  Result Value Ref Range Status   MRSA by PCR NEGATIVE NEGATIVE Final    Comment:        The GeneXpert MRSA Assay (FDA approved for NASAL specimens only), is one component of a comprehensive MRSA colonization surveillance program. It is not intended to diagnose MRSA infection nor to guide or monitor treatment for MRSA infections.      Radiological Exams on Admission: Dg Chest Portable 1 View  05/23/2015  CLINICAL DATA:  Patient aspirated at breakfast yesterday and has a cough since then. Generalized  weakness. EXAM: PORTABLE CHEST 1 VIEW COMPARISON:  05/14/2015 FINDINGS: Cardiac pacemaker. Diffuse enlargement of the cardiac silhouette which could indicate cardiac enlargement or pericardial effusion. No significant vascular congestion. There are small bilateral pleural effusions with atelectasis or infiltration suggested in both lung bases. Changes could indicate pneumonia. Large esophageal hiatal hernia behind the heart. Calcified and tortuous aorta. No pneumothorax. IMPRESSION: Enlarged cardiac silhouette. Small bilateral pleural effusions with basilar atelectasis or infiltration. Large esophageal hiatal hernia behind the heart. Electronically Signed   By: Burman Nieves M.D.   On: 05/23/2015 00:29    EKG: Independently reviewed.   Assessment/Plan:  1.  Aspiration pneumonia, started on IV vancomycin and Zosyn. Will place NPO, except for meds.until swallow study. Will consult speech for swallow study, and recommendation for proper diet.  2. Hypokalemia, replace.  3. Chronic CHF. Appears compensated. Will continue to monitor.  Will give very gentle IVF.  4. Atrial fibrillation, due to recent history of GI bleed anticoagulant have been discontinued.  5. Anemia, Hgb stable from recent transfusion.  Her Hb is actually higher than discharge value.  6. HLD. Continue statin. 7. HTN. Continue home medications.  8. CAD.  Stable.    DVT prophylaxis: SCDs Code Status: Full Code.  Family Communication: Brother and Daughter bedside.  Disposition Plan: Anticipate discharge in 2-3 days.  Consults called: None Admission status: Admit to medical bed.    Houston Siren MD FACP. [facility] Pager [phone]  If 7PM-7AM, please contact night-coverage [email or web address] Password [email or web address]  05/23/2015, 2:17 AM   By signing my name below, I, Zadie Cleverly, attest that this documentation has been prepared under the direction and in the presence of Houston Siren, MD. Electronically signed: Zadie Cleverly, Scribe. 05/23/2015 2:15am  I, Dr. Houston Siren, personally performed the services described in this documentation. All medical record entries made by the scribe were at my discretion and in my presence. Houston Siren, MD 05/23/2015

## 2015-05-23 NOTE — Telephone Encounter (Addendum)
Call placed to daughter [name]).  Explained to her that the cardiologist does not come see the patient, unless a consult is requested by the doc taking care of her there requests it.  I suggested she discuss her concerns with the charge nurse there.  Message will be fwd to provider also.  She verbalized understanding.    =======================================================================================  ----- Message -----     From: Johnnye Lana    Sent: 05/23/2015  6:51 AM     To: [facility] Ref[facility]   Subject: Non-Urgent Medical Question               Dr. Purvis Sheffield,   Ms. Steimer has been admitted back to [facility] with Pneumonia after getting choked on some food on yesterday. We are wondering if this is related to her heart condition. Is it possible for you to see her in the hospital to make sure all is well? We are really concerned with the rate in which she is declining. She is in room 339.       Thank You,   Johny Shock (daughter)

## 2015-05-23 NOTE — Evaluation (Signed)
Clinical/Bedside Swallow Evaluation Patient Details  Name: [name] MRN: [id number] Date of Birth: August 16, 1920  Today's Date: 05/23/2015 Time: SLP Start Time (ACUTE ONLY): 1900 SLP Stop Time (ACUTE ONLY): 1932 SLP Time Calculation (min) (ACUTE ONLY): 32 min  Past Medical History:  Past Medical History  Diagnosis Date  . DJD (degenerative joint disease)   . Osteoporosis   . Anxiety   . Sleep apnea   . Seasonal allergies   . Anemia   . Hypertension   . Atrial fibrillation [facility])    Past Surgical History:  Past Surgical History  Procedure Laterality Date  . Pacemaker insertion    . Hemorrhoid surgery    . Cholecystectomy    . Total hip arthroplasty    . Esophagogastroduodenoscopy N/A 05/16/2015    Procedure: ESOPHAGOGASTRODUODENOSCOPY (EGD);  Surgeon: West Bali, MD;  Location: [facility];  Service: Endoscopy;  Laterality: N/A;   HPI:  [name] is a 82 y.o. female with medical history significant of DJD, HTN, systolic murmur, cardiac pacemaker, and atrial fibrillation presented with daughter and brother with complaints of SOB secondary to cough that onset around 9am this morning. Per daughter bedside she was having breakfast this morning and was noted to have intermittent cough through out the day. Daughter denies any swallow studies. She does report recent recurrence of issues with eating and drinking. 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 Chest x-ray today showed: Small bilateral pleural effusions with basilar atelectasis or infiltration in both lung bases. Large esophageal hiatal hernia behind the heart. SLP asked to evaluate swallow.   Assessment / Plan / Recommendation Clinical Impression  Ms. Poertner was assessed at bedside for dysphagia evaluation. Pt visibly short of breath and working hard to breathe. She was repositioned in bed with assist from RN and oral care completed. Mild excess, clear  secretions orally; pt able to swallow and cough upon SLP request. Pt demonstrates good labiolingual strength. Palate rises briskly, but does have red, bumpy appearance. Vocal quality is clear, but becomes mildly wet after congested cough. Pt tolerated limited trials of ice chips, thin water via cup and straw sips, and puree with evidence of working to breathe and coordinate swallow. Pt denies feeling hungry and needed encouragement for po trials. Prolonged oral transit noted with puree, as pt masticated (baseline per pt). Pt also reports being a slow eater. Recommend conservative diet due to work of breathing of D1/puree and thin liquids only when pt desires. SLP provided education to family/pt regarding importance of oral care, sitting upright for eating/drinking, and not forcing po on pt. Family appreciative of recommendations and questions answered. Pt's primary risk for aspiration at this time is difficulty coordinating respiration with swallow due to shortness of breath. Above to RN. SLP will follow tomorrow.     Aspiration Risk  Moderate aspiration risk    Diet Recommendation Dysphagia 1 (Puree);Thin liquid   Liquid Administration via: Cup;Straw Medication Administration: Whole meds with liquid Supervision: Patient able to self feed;Full supervision/cueing for compensatory strategies Compensations: Slow rate;Small sips/bites Postural Changes: Seated upright at 90 degrees;Remain upright for at least 30 minutes after po intake    Other  Recommendations Oral Care Recommendations: Oral care BID;Staff/trained caregiver to provide oral care Other Recommendations: Clarify dietary restrictions   Follow up Recommendations  Skilled Nursing facility    Frequency and Duration min 2x/week  1 week       Prognosis Prognosis  for Safe Diet Advancement: Fair Barriers to Reach Goals:  (respiratory status)      Swallow Study   General Date of Onset: 05/22/15 HPI: Ashley ANAGNOS is a 82 y.o. female with  medical history significant of DJD, HTN, systolic murmur, cardiac pacemaker, and atrial fibrillation presented with daughter and brother with complaints of SOB secondary to cough that onset around 9am this morning. Per daughter bedside she was having breakfast this morning and was noted to have intermittent cough through out the day. Daughter denies any swallow studies. She does report recent recurrence of issues with eating and drinking. 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 Chest x-ray today showed: Small bilateral pleural effusions with basilar atelectasis or infiltration in both lung bases. Large esophageal hiatal hernia behind the heart. SLP asked to evaluate swallow. Type of Study: Bedside Swallow Evaluation Previous Swallow Assessment: None on record Diet Prior to this Study: NPO Temperature Spikes Noted: No Respiratory Status: Nasal cannula History of Recent Intubation: No Behavior/Cognition: Alert;Cooperative;Pleasant mood Oral Cavity Assessment: Within Functional Limits;Excessive secretions Oral Care Completed by SLP: Yes Oral Cavity - Dentition: Adequate natural dentition (unusual tissue near uvulae) Vision: Functional for self-feeding Self-Feeding Abilities: Needs assist Patient Positioning: Upright in bed (repositioned, leans right) Baseline Vocal Quality: Normal (visibly SOB) Volitional Cough: Strong;Congested Volitional Swallow: Able to elicit    Oral/Motor/Sensory Function Overall Oral Motor/Sensory Function: Within functional limits   Ice Chips Ice chips: Within functional limits Presentation: Spoon   Thin Liquid Thin Liquid: Within functional limits Presentation: Cup;Spoon;Straw    Nectar Thick Nectar Thick Liquid: Not tested   Honey Thick Honey Thick Liquid: Not tested   Puree Puree: Impaired Presentation: Spoon Oral Phase Functional Implications: Prolonged oral transit Pharyngeal Phase  Impairments: Multiple swallows   Solid   Thank you,  Havery Moros, CCC-SLP [phone]  Solid: Not tested (due to SOB)        PORTER,DABNEY 05/23/2015,8:01 PM

## 2015-05-23 NOTE — ED Notes (Signed)
EKG given to MD Lynelle Doctor

## 2015-05-23 NOTE — Consult Note (Signed)
Consultation Note Date: 05/23/2015   Patient Name: [name]  DOB: 1920/07/08  MRN: [id number]  Age / Sex: 82 y.o., female  PCP: Avon Gully, MD Referring Physician: Avon Gully, MD  Reason for Consultation: Establishing goals of care and Psychosocial/spiritual support  HPI/Patient Profile: 82 y.o. female  with past medical history of HTN, systolic murmur, cardiac pacemaker, atrial field, DJD, and recent hospitalization for erosion of lining and stomach. admitted on 05/22/2015 with possible pneumonia..   Clinical Assessment and Goals of Care: Mrs. Debenedetto is resting in bed with her daughter Lance Bosch, and DIL Bonita Quin at bedside. She makes eye contact and greets me as I enter. She is able to tell me who is in the room with [place]. She is noted to have an ongoing nonproductive cough. We talk about her current illness including aspiration, speech therapy evaluation, and specialized diets. We talk about her recent hospitalization for G.I. bleed. We talk about the chronic illness trajectory and how being weaker after hospitalizations is normal and expected.   Mrs. Doland had been independent with ADLs prior to her most recent hospitalization. She was living alone and providing self-care, walking with a walker.  She tells me that she was able to provide for her own bath. We talk about accepting help from her 7 children (6 still living) and grandchildren when she is able to return home. We talk about the possibility of rehabilitation for 3 weeks. Physical therapy evaluation when she is stronger.  We talk about healthcare power of attorney, and code status. Information is offered about her choices, I encouraged her and her family to think about their options, formulate questions and meet with me again tomorrow.   OTHER, Mrs. Scheele is able to make her own choices at this time. She shares that the majority of her adult children  would make decisions for her she is unable.   Mrs. Gepford shares that she leans on her children in making decisions.    SUMMARY OF RECOMMENDATIONS   treat the treatable at this point, continue with treatment for possible aspiration pneumonia, speech therapy evaluation, specialized diet as needed, we discuss physical therapy evaluation in a few days, and the possibility of rehab for improvement in strength and mobility.   Code Status/Advance Care Planning:  Full code, Mrs. Raysor states that this time, "if I can be helped, help me". We discussed the realities of CPR, including chest compressions that would likely break her ribs. I share my worry that if she were to be so sick as to need this treatment it would not be successful.  We talk about intubation and ventilation, and that she would not be able to talk with her family if she were intubated, and again, my worry about what this would look like and feel like for her. We talk about the concept of peg tube for nutrition.  I encourage Mrs. Britts to think about these choices, and that we will talk more later. I encourage family to meet with me to discuss the  choices they have for their mother.  Symptom Management:   per Dr. Felecia Shelling.  Palliative Prophylaxis:   Aspiration, Oral Care and Turn Reposition  Additional Recommendations (Limitations, Scope, Preferences):  Treat the treatable at this point.   Psycho-social/Spiritual:   Desire for further Chaplaincy support:no  Additional Recommendations: Caregiving  Support/Resources and Education on Hospice  Prognosis:   < 6 months, likely based on recurrent hospitalizations, and possible aspiration.  Discharge Planning: To Be Determined      Primary Diagnoses: Present on Admission:  . Hospital acquired PNA . Atrial fibrillation (HCC) . Cardiac pacemaker in situ . GI bleed . Systolic murmur . HCAP (healthcare-associated pneumonia)  I have reviewed the medical record, interviewed the  patient and family, and examined the patient. The following aspects are pertinent.  Past Medical History  Diagnosis Date  . DJD (degenerative joint disease)   . Osteoporosis   . Anxiety   . Sleep apnea   . Seasonal allergies   . Anemia   . Hypertension   . Atrial fibrillation [facility])    Social History   Social History  . Marital Status: Widowed    Spouse Name: N/A  . Number of Children: N/A  . Years of Education: N/A   Social History Main Topics  . Smoking status: Never Smoker   . Smokeless tobacco: None  . Alcohol Use: No  . Drug Use: No  . Sexual Activity: Not Asked   Other Topics Concern  . None   Social History Narrative   Widowed   Daughter   Family History  Problem Relation Age of Onset  . Colon cancer Father   . Colon cancer Paternal Aunt   . Crohn's disease Son    Scheduled Meds: . amiodarone  200 mg Oral Daily  . amLODipine  5 mg Oral Daily  . guaiFENesin-codeine  5 mL Oral QID  . levalbuterol  0.63 mg Nebulization Q6H  . lisinopril  40 mg Oral Daily  . pantoprazole  40 mg Oral BID AC  . piperacillin-tazobactam (ZOSYN)  IV  3.375 g Intravenous Q8H  . potassium chloride  10 mEq Intravenous Q1 Hr x 5  . pravastatin  40 mg Oral Daily  . vancomycin  1,000 mg Intravenous Q24H   Continuous Infusions: . dextrose 5 % and 0.9% NaCl 50 mL/hr at 05/23/15 0428   PRN Meds:.ondansetron **OR** ondansetron (ZOFRAN) IV Medications Prior to Admission:  Prior to Admission medications   Medication Sig Start Date End Date Taking? Authorizing Provider  acetaminophen (TYLENOL) 650 MG CR tablet Take 650 mg by mouth every 8 (eight) hours as needed for pain.    Yes Historical Provider, MD  amiodarone (PACERONE) 200 MG tablet Take 1 tablet (200 mg total) by mouth daily. 05/01/15  Yes Marinus Maw, MD  amLODipine (NORVASC) 5 MG tablet Take 1 tablet (5 mg total) by mouth daily. 05/25/14  Yes Laqueta Linden, MD  calcium-vitamin D (OSCAL WITH D) 500-200 MG-UNIT per tablet  Take 1 tablet by mouth daily.     Yes Historical Provider, MD  ferrous sulfate 325 (65 FE) MG EC tablet Take 65 mg by mouth 3 (three) times daily with meals.   Yes Historical Provider, MD  furosemide (LASIX) 40 MG tablet Take 1 tablet (40 mg total) by mouth daily. Patient taking differently: Take 20 mg by mouth daily. Decreased to 20 mg daily. May take an extra 20 mg if leg swelling/weight gain occurs. 03/08/15  Yes Dyann Kief, PA-C  lisinopril (  PRINIVIL,ZESTRIL) 40 MG tablet Take 1 tablet (40 mg total) by mouth daily. 04/08/12  Yes Marinus Maw, MD  pantoprazole (PROTONIX) 40 MG tablet Take 1 tablet (40 mg total) by mouth 2 (two) times daily before a meal. 05/19/15  Yes Avon Gully, MD  potassium chloride SA (K-DUR,KLOR-CON) 20 MEQ tablet Take 1 tablet (20 mEq total) by mouth daily. 03/08/15  Yes Dyann Kief, PA-C  pravastatin (PRAVACHOL) 40 MG tablet Take 40 mg by mouth daily.   Yes Historical Provider, MD   No Known Allergies Review of Systems  Unable to perform ROS: Other    Physical Exam  Constitutional: She is oriented to person, place, and time. No distress.  Pulmonary/Chest:  Persistent nonproductive cough. Some work of breathing noted.  Abdominal: Soft. She exhibits no distension. There is no guarding.  Neurological: She is alert and oriented to person, place, and time.  Skin: Skin is warm and dry.  Nursing note and vitals reviewed.   Vital Signs: BP 121/50 mmHg  Pulse 73  Temp(Src) 98.4 F (36.9 C) (Oral)  Resp 22  Ht 5\' 3"  (1.6 m)  Wt 69.8 kg (153 lb 14.1 oz)  BMI 27.27 kg/m2  SpO2 95% Pain Assessment: No/denies pain   Pain Score: 0-No pain   SpO2: SpO2: 95 % O2 Device:SpO2: 95 % O2 Flow Rate: .O2 Flow Rate (L/min): 99 L/min  IO: Intake/output summary: No intake or output data in the 24 hours ending 05/23/15 1326  LBM: Last BM Date: 05/22/15 Baseline Weight: Weight: 69.8 kg (153 lb 14.1 oz) Most recent weight: Weight: 69.8 kg (153 lb 14.1 oz)       Palliative Assessment/Data:   Flowsheet Rows        Most Recent Value   Intake Tab    Unit at Time of Referral  Med/Surg Unit   Palliative Care Primary Diagnosis  Cancer   Date Notified  05/23/15   Palliative Care Type  New Palliative care   Reason for referral  Advance Care Planning, Clarify Goals of Care   Date of Admission  05/22/15   Date first seen by Palliative Care  05/23/15   # of days Palliative referral response time  0 Day(s)   # of days IP prior to Palliative referral  1   Clinical Assessment    Palliative Performance Scale Score  40%   Pain Max last 24 hours  Not able to report   Pain Min Last 24 hours  Not able to report   Dyspnea Max Last 24 Hours  Not able to report   Dyspnea Min Last 24 hours  Not able to report   Psychosocial & Spiritual Assessment    Palliative Care Outcomes    Patient/Family meeting held?  Yes   Who was at the meeting?  patient, daughter Lance Bosch, DIL [facility]   Palliative Care Outcomes  Clarified goals of care, Provided advance care planning, Provided psychosocial or spiritual support   Palliative Care follow-up planned  -- [follow up at [facility]]      Time In: 1110 Time Out: 1200 Time Total: 50 minutes Greater than 50%  of this time was spent counseling and coordinating care related to the above assessment and plan.  Signed by: Katheran Awe, NP   Please contact Palliative Medicine Team phone at [phone] for questions and concerns.  For individual provider: See Loretha Stapler

## 2015-05-23 NOTE — Telephone Encounter (Signed)
I reviewed the chart. Appears to be due to pneumonia, and not a cardiac condition.

## 2015-05-23 NOTE — ED Provider Notes (Signed)
CSN: [id number]     Arrival date & time 05/22/15  2338 History  By signing my name below, I, Linus Galas, attest that this documentation has been prepared under the direction and in the presence of Avon Gully, MD at 0027 AM. Electronically Signed: Linus Galas, ED Scribe. 05/23/2015. 12:48 AM.   Chief Complaint  Patient presents with  . Shortness of Breath   The history is provided by the patient. No language interpreter was used.   HPI Comments: [name] is a 82 y.o. female who presents to the Emergency Department complaining of SOB secondary to her cough that began this 9 AM this morning. Daughter states the pt was eating when she suddenly was choking on her food. She does not know what the pt was eating but denies seeing the pt getting red or blue. She then started having coughing that has persisted all day. Daughter states the pt was hospitalized and discharged 3 days ago for a hemoglobin of 5. 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 Pt had been  on Xarelto which was stopped. She received a blood transfusion and she reports hemoglobin was 8 when she was discharged. Daughter denies any fevers, chills, CP, N/V/D or any other symptoms at this time. Pt lives on her own but her children have been staying with her since she was discharged from the hospital 3 days ago.  GI Dr Darrick Penna, recent EDG showing gastric erosions PCP Dr. Felecia Shelling.   Past Medical History  Diagnosis Date  . DJD (degenerative joint disease)   . Osteoporosis   . Anxiety   . Sleep apnea   . Seasonal allergies   . Anemia   . Hypertension   . Atrial fibrillation [facility])    Past Surgical History  Procedure Laterality Date  . Pacemaker insertion    . Hemorrhoid surgery    . Cholecystectomy    . Total hip arthroplasty    . Esophagogastroduodenoscopy N/A 05/16/2015    Procedure: ESOPHAGOGASTRODUODENOSCOPY (EGD);  Surgeon: West Bali, MD;  Location: [facility] SU[facility];  Service: Endoscopy;  Laterality:  N/A;   Family History  Problem Relation Age of Onset  . Colon cancer Father   . Colon cancer Paternal Aunt   . Crohn's disease Son    Social History  Substance Use Topics  . Smoking status: Never Smoker   . Smokeless tobacco: None  . Alcohol Use: No   Lives alone  OB History    No data available     Review of Systems  Constitutional: Negative for fever and chills.  Respiratory: Positive for cough and shortness of breath.   Cardiovascular: Negative for chest pain.  Gastrointestinal: Negative for nausea, vomiting and diarrhea.  All other systems reviewed and are negative.    Allergies  Review of patient's allergies indicates no known allergies.  Home Medications   Prior to Admission medications   Medication Sig Start Date End Date Taking? Authorizing Provider  acetaminophen (TYLENOL) 650 MG CR tablet Take 650 mg by mouth every 8 (eight) hours as needed for pain.     Historical Provider, MD  amiodarone (PACERONE) 200 MG tablet Take 1 tablet (200 mg total) by mouth daily. 05/01/15   Marinus Maw, MD  amLODipine (NORVASC) 5 MG tablet Take 1 tablet (5 mg total) by mouth daily. 05/25/14   Laqueta Linden, MD  calcium-vitamin D (OSCAL WITH D) 500-200 MG-UNIT per tablet Take 1 tablet by mouth daily.  Historical Provider, MD  ferrous sulfate 325 (65 FE) MG EC tablet Take 65 mg by mouth 3 (three) times daily with meals.    Historical Provider, MD  furosemide (LASIX) 40 MG tablet Take 1 tablet (40 mg total) by mouth daily. Patient taking differently: Take 20 mg by mouth daily. Decreased to 20 mg daily. May take an extra 20 mg if leg swelling/weight gain occurs. 03/08/15   Dyann Kief, PA-C  lisinopril (PRINIVIL,ZESTRIL) 40 MG tablet Take 1 tablet (40 mg total) by mouth daily. 04/08/12   Marinus Maw, MD  pantoprazole (PROTONIX) 40 MG tablet Take 1 tablet (40 mg total) by mouth 2 (two) times daily before a meal. 05/19/15   Avon Gully, MD  potassium chloride SA  (K-DUR,KLOR-CON) 20 MEQ tablet Take 1 tablet (20 mEq total) by mouth daily. 03/08/15   Dyann Kief, PA-C  pravastatin (PRAVACHOL) 40 MG tablet Take 40 mg by mouth daily.    Historical Provider, MD   BP 130/55 mmHg  Pulse 59  Temp(Src) 98.6 F (37 C) (Oral)  Resp 22  SpO2 99%  Vital signs normal     Physical Exam  Constitutional: She is oriented to person, place, and time. She appears well-developed and well-nourished.  Non-toxic appearance. She does not appear ill. No distress.  HENT:  Head: Normocephalic and atraumatic.  Right Ear: External ear normal.  Left Ear: External ear normal.  Nose: Nose normal. No mucosal edema or rhinorrhea.  Mouth/Throat: Oropharynx is clear and moist and mucous membranes are normal. No dental abscesses or uvula swelling.  Eyes: Conjunctivae and EOM are normal. Pupils are equal, round, and reactive to light.  Neck: Normal range of motion and full passive range of motion without pain. Neck supple.  Cardiovascular: Normal rate, regular rhythm and normal heart sounds.  Exam reveals no gallop and no friction rub.   No murmur heard. Pulmonary/Chest: Effort normal. No respiratory distress. She has wheezes. She has rhonchi. She has no rales. She exhibits no tenderness and no crepitus.  Frequently coughing    Abdominal: Soft. Normal appearance and bowel sounds are normal. She exhibits no distension. There is no tenderness. There is no rebound and no guarding.  Musculoskeletal: Normal range of motion. She exhibits no edema or tenderness.  Moves all extremities well.   Neurological: She is alert and oriented to person, place, and time. She has normal strength. No cranial nerve deficit.  Skin: Skin is warm, dry and intact. No rash noted. No erythema. No pallor.  Psychiatric: She has a normal mood and affect. Her speech is normal and behavior is normal. Her mood appears not anxious.  Nursing note and vitals reviewed.   ED Course  Procedures   Medications   levalbuterol (XOPENEX) nebulizer solution 1.25 mg (1.25 mg Nebulization Given 05/23/15 0107)  ipratropium (ATROVENT) nebulizer solution 0.5 mg (0.5 mg Nebulization Given 05/23/15 0107)  ceFEPIme (MAXIPIME) 1 g in dextrose 5 % 50 mL IVPB (0 g Intravenous Stopped 05/23/15 0217)  vancomycin (VANCOCIN) IVPB 1000 mg/200 mL premix (0 mg Intravenous Stopped 05/23/15 0321)   DIAGNOSTIC STUDIES: Oxygen Saturation is 98% on room air, normal by my interpretation.    COORDINATION OF CARE: 12:38 AM Will give breathing treatment. Discussed her CXR, EKG, and blood work results. Discussed treatment plan with pt and daughter at bedside and they agreed to plan. Patient was started on antibiotics for healthcare associated pneumonia although this is possibly a aspiration pneumonia. We discussed she would most likely need admission  to the hospital.  02:15 Dr Conley Rolls, hospitalist will admit.  Labs Review Results for orders placed or performed during the hospital encounter of 05/22/15  CBC with Differential  Result Value Ref Range   WBC 7.6 4.0 - 10.5 K/uL   RBC 2.88 (L) 3.87 - 5.11 MIL/uL   Hemoglobin 9.1 (L) 12.0 - 15.0 g/dL   HCT 40.9 (L) 81.1 - 91.4 %   MCV 93.8 78.0 - 100.0 fL   MCH 31.6 26.0 - 34.0 pg   MCHC 33.7 30.0 - 36.0 g/dL   RDW 78.2 (H) 95.6 - 21.3 %   Platelets 180 150 - 400 K/uL   Neutrophils Relative % 78 %   Neutro Abs 6.0 1.7 - 7.7 K/uL   Lymphocytes Relative 10 %   Lymphs Abs 0.7 0.7 - 4.0 K/uL   Monocytes Relative 10 %   Monocytes Absolute 0.8 0.1 - 1.0 K/uL   Eosinophils Relative 2 %   Eosinophils Absolute 0.1 0.0 - 0.7 K/uL   Basophils Relative 0 %   Basophils Absolute 0.0 0.0 - 0.1 K/uL  Basic metabolic panel  Result Value Ref Range   Sodium 140 135 - 145 mmol/L   Potassium 2.8 (L) 3.5 - 5.1 mmol/L   Chloride 103 101 - 111 mmol/L   CO2 28 22 - 32 mmol/L   Glucose, Bld 116 (H) 65 - 99 mg/dL   BUN 9 6 - 20 mg/dL   Creatinine, Ser 0.86 0.44 - 1.00 mg/dL   Calcium 8.3 (L) 8.9 - 10.3  mg/dL   GFR calc non Af Amer 49 (L) >60 mL/min   GFR calc Af Amer 57 (L) >60 mL/min   Anion gap 9 5 - 15   Laboratory interpretation all normal except hypokalemia, anemia but improved     Imaging Review Dg Chest Portable 1 View  05/23/2015  CLINICAL DATA:  Patient aspirated at breakfast yesterday and has a cough since then. Generalized weakness. EXAM: PORTABLE CHEST 1 VIEW COMPARISON:  05/14/2015 FINDINGS: Cardiac pacemaker. Diffuse enlargement of the cardiac silhouette which could indicate cardiac enlargement or pericardial effusion. No significant vascular congestion. There are small bilateral pleural effusions with atelectasis or infiltration suggested in both lung bases. Changes could indicate pneumonia. Large esophageal hiatal hernia behind the heart. Calcified and tortuous aorta. No pneumothorax. IMPRESSION: Enlarged cardiac silhouette. Small bilateral pleural effusions with basilar atelectasis or infiltration. Large esophageal hiatal hernia behind the heart. Electronically Signed   By: Burman Nieves M.D.   On: 05/23/2015 00:29   I have personally reviewed and evaluated these images and lab results as part of my medical decision-making.   EKG Interpretation   Date/Time:  Monday May 22 2015 23:56:57 EDT Ventricular Rate:  102 PR Interval:  158 QRS Duration: 111 QT Interval:  391 QTC Calculation: 509 R Axis:   20 Text Interpretation:  A-V dual-paced complexes w/ some inhibition No  further analysis attempted due to paced rhythm Baseline wander in lead(s)  II aVF some native beats with RBBB Confirmed by Finesse Fielder  MD-I, Crystalmarie Yasin ([id number])  on 05/23/2015 1:08:14 AM      MDM   Final diagnoses:  Choking, initial encounter  Aspiration pneumonia of both lower lobes, unspecified aspiration pneumonia type (HCC)  Bronchospasm    Plan admission  Devoria Albe, MD, FACEP    I personally performed the services described in this documentation, which was scribed in my presence. The recorded  information has been reviewed and considered.  Devoria Albe, MD, Armando Gang  Devoria Albe, MD 05/23/15 [phone]

## 2015-05-23 NOTE — ED Notes (Signed)
Pt's daughter reports that pt was in the hospital recently for a GI bleed and has since been weak. Stated this morning she got "strangled" on breakfast and has been coughing since. Pt denies CP, N/V/D/ at this time.

## 2015-05-23 NOTE — Progress Notes (Signed)
Pharmacy Antibiotic Note  [name] is a 82 y.o. female admitted on 05/22/2015 with pneumonia.  Pharmacy has been consulted for Vancomycin and zosyn dosing. Patient not loaded, will start maintenance dose earlier  Plan: Vancomycin 1000mg  IV every 24 hours.  Goal trough 15-20 mcg/mL. Zosyn 3.375g IV q8h (4 hour infusion). F/U cultures and deescalate as appropriately indicated  Height: 5\' 3"  (160 cm) Weight: 153 lb 14.1 oz (69.8 kg) IBW/kg (Calculated) : 52.4  Temp (24hrs), Avg:98.5 F (36.9 C), Min:98.4 F (36.9 C), Max:98.6 F (37 C)   Recent Labs Lab 05/17/15 0430 05/18/15 0421 05/19/15 0448 05/23/15 0026  WBC 8.2 7.0 6.5 7.6  CREATININE  --   --   --  0.97    Estimated Creatinine Clearance: 34 mL/min (by C-G formula based on Cr of 0.97).    No Known Allergies  Antimicrobials this admission: Vancomycin 5/2 >>  Zosyn 5/2 >>   Microbiology results: 4/25 MRSA PCR: neg  Thank you for allowing pharmacy to be a part of this patient's care. Elder Cyphers, BS Pharm D, [place] Clinical Pharmacist Pager [phone] 05/23/2015 7:44 AM

## 2015-05-24 LAB — BASIC METABOLIC PANEL
ANION GAP: 7 (ref 5–15)
BUN: 10 mg/dL (ref 6–20)
CALCIUM: 8.2 mg/dL — AB (ref 8.9–10.3)
CO2: 27 mmol/L (ref 22–32)
Chloride: 104 mmol/L (ref 101–111)
Creatinine, Ser: 0.81 mg/dL (ref 0.44–1.00)
Glucose, Bld: 156 mg/dL — ABNORMAL HIGH (ref 65–99)
Potassium: 3.5 mmol/L (ref 3.5–5.1)
SODIUM: 138 mmol/L (ref 135–145)

## 2015-05-24 NOTE — Progress Notes (Signed)
Daily Progress Note   Patient Name: [name]       Date: 05/24/2015 DOB: 05-20-1920  Age: 82 y.o. MRN#: [id number] Attending Physician: Ashley Gully, MD Primary Care Physician: Ashley Gully, MD Admit Date: 05/22/2015  Reason for Consultation/Follow-up: Disposition, Establishing goals of care, Non pain symptom management and Psychosocial/spiritual support  Subjective: [name] is lying quietly in bed and asleep.  Her daughter [name] is at bedside. [name] is resting so quietly I do not try to wake her to speak with her. [name] not talk about the plan of care including speech therapy evaluation yesterday, and likely ongoing evaluations. Also physical therapy evaluation that may be completed tomorrow. We talk about continuing the current treatment plan with IV antibiotics and supportive therapies. We talk about rehab to regain strength. [name] shares that families request is [facility] for rehab, if [name] agrees and if possible. They have no second choice at this time.  I share that I will continue to follow them and we will talk more about her health concerns, disposition, and choices during this time.  Length of Stay: 1  Current Medications: Scheduled Meds:  . amiodarone  200 mg Oral Daily  . amLODipine  5 mg Oral Daily  . guaiFENesin-codeine  5 mL Oral QID  . levalbuterol  0.63 mg Nebulization BID  . lisinopril  40 mg Oral Daily  . pantoprazole  40 mg Oral BID AC  . piperacillin-tazobactam (ZOSYN)  IV  3.375 g Intravenous Q8H  . pravastatin  40 mg Oral Daily  . vancomycin  1,000 mg Intravenous Q24H    Continuous Infusions: . dextrose 5 % and 0.9% NaCl 50 mL/hr at 05/23/15 0428    PRN Meds: ALPRAZolam, ondansetron **OR** ondansetron (ZOFRAN) IV  Physical Exam    Constitutional: No distress.  Pulmonary/Chest: No respiratory distress.  Mild work of breathing noted  Neurological:  Asleep, did not try to wake her.  Nursing note and vitals reviewed.           Vital Signs: BP 121/52 mmHg  Pulse 82  Temp(Src) 98 F (36.7 C) (Oral)  Resp 22  Ht 5\' 3"  (1.6 m)  Wt 69.8 kg (153 lb 14.1 oz)  BMI 27.27 kg/m2  SpO2 88% SpO2: SpO2: (!) 88 % O2 Device: O2 Device: Nasal Cannula O2  Flow Rate: O2 Flow Rate (L/min): 3 L/min  Intake/output summary:  Intake/Output Summary (Last 24 hours) at 05/24/15 1349 Last data filed at 05/24/15 0800  Gross per 24 hour  Intake    120 ml  Output    200 ml  Net    -80 ml   LBM: Last BM Date: 05/22/15 Baseline Weight: Weight: 69.8 kg (153 lb 14.1 oz) Most recent weight: Weight: 69.8 kg (153 lb 14.1 oz)       Palliative Assessment/Data:    Flowsheet Rows        Most Recent Value   Intake Tab    Unit at Time of Referral  Med/Surg Unit   Palliative Care Primary Diagnosis  Cancer   Date Notified  05/23/15   Palliative Care Type  New Palliative care   Reason for referral  Advance Care Planning, Clarify Goals of Care   Date of Admission  05/22/15   Date first seen by Palliative Care  05/23/15   # of days Palliative referral response time  0 Day(s)   # of days IP prior to Palliative referral  1   Clinical Assessment    Palliative Performance Scale Score  40%   Pain Max last 24 hours  Not able to report   Pain Min Last 24 hours  Not able to report   Dyspnea Max Last 24 Hours  Not able to report   Dyspnea Min Last 24 hours  Not able to report   Psychosocial & Spiritual Assessment    Palliative Care Outcomes    Patient/Family meeting held?  Yes   Who was at the meeting?  patient, daughter [name], [name]   Palliative Care Outcomes  Clarified goals of care, Provided advance care planning, Provided psychosocial or spiritual support   Palliative Care follow-up planned  -- [follow up at [facility]]       Patient Active Problem List   Diagnosis Date Noted  . Hospital acquired PNA 05/23/2015  . HCAP (healthcare-associated pneumonia) 05/23/2015  . Aspiration pneumonia of both lower lobes (HCC)   . Palliative care encounter   . DNR (do not resuscitate) discussion   . Acute GI bleeding   . Severe anemia 05/14/2015  . Melena 05/14/2015  . Weakness generalized 05/14/2015  . GI bleed 05/14/2015  . Atrial fibrillation (HCC) 04/26/2015  . Severe tricuspid regurgitation 03/15/2015  . CHF (congestive heart failure) (HCC) 03/08/2015  . Systolic murmur 03/08/2015  . CAD (coronary artery disease) 05/25/2014  . DYSLIPIDEMIA 03/14/2010  . Essential hypertension, benign 03/20/2009  . BRADYCARDIA 03/20/2009  . Cardiac pacemaker in situ 03/20/2009    Palliative Care Assessment & Plan   Patient Profile: [name] is a 82 year old female with a history of hypertension, systolic murmur, DJD, cardiac pacemaker, atrial fibrillation, and recent hospitalization and treatment for erosion of lining of stomach  Assessment: As above  Recommendations/Plan:  continue with current treatment plan, 24 to 48 hours.  Continuous speech therapy consults, physical therapy consult when appropriate. Questioning rehab to regain strength with the goal of returning to her own home.  Goals of Care and Additional Recommendations:  Limitations on Scope of Treatment: Treat the treatable at this point, no limitations at this time.  Code Status:    Code Status Orders        Start     Ordered   05/23/15 0405  Full code   Continuous     05/23/15 0404    Code Status History  Date Active Date Inactive Code Status Order ID Comments User Context   05/14/2015 11:59 AM 05/19/2015  2:18 PM Full Code [id number]  Ashley Cloud, MD Inpatient       Prognosis:   < 6 months, likely due to questionable recurrent aspiration, functional decline, and frailty.  Discharge Planning:  To Be Determined, ultimate  goal is to return to her own home, but she is open to rehab if needed.  Care plan was discussed with Nursing staff, CM, SW, and [name] on next rounds.  Thank you for allowing the Palliative Medicine Team to assist in the care of this patient.   Time In: 1005 Time Out: 1030 Total Time 30 minutes Prolonged Time Billed  no       Greater than 50%  of this time was spent counseling and coordinating care related to the above assessment and plan.  Taira Knabe A, NP  Please contact Palliative Medicine Team phone at [phone] for questions and concerns.

## 2015-05-24 NOTE — Progress Notes (Signed)
Speech Language Pathology Treatment: Dysphagia  Patient Details Name: [name] MRN: [id number] DOB: 08-Aug-1920 Today's Date: 05/24/2015 Time: 0814-4818 SLP Time Calculation (min) (ACUTE ONLY): 23 min  Assessment / Plan / Recommendation Clinical Impression  Pt seen at bedside with family present and assessed with straw sips of thin tea. Pt alert, but still congested and short of breath. PO intake has been minimal. Aspiration precautions reviewed with pt and family; Continue with oral care. SLP will continue to follow in acute setting.    HPI HPI: [name] is a 82 y.o. female with medical history significant of DJD, HTN, systolic murmur, cardiac pacemaker, and atrial fibrillation presented with daughter and brother with complaints of SOB secondary to cough that onset around 9am this morning. Per daughter bedside she was having breakfast this morning and was noted to have intermittent cough through out the day. Daughter denies any swallow studies. She does report recent recurrence of issues with eating and drinking. 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 Chest x-ray today showed: Small bilateral pleural effusions with basilar atelectasis or infiltration in both lung bases. Large esophageal hiatal hernia behind the heart. SLP asked to evaluate swallow.      SLP Plan  Continue with current plan of care     Recommendations  Diet recommendations: Dysphagia 1 (puree);Thin liquid Liquids provided via: Cup;Straw Medication Administration: Whole meds with liquid Supervision: Staff to assist with self feeding;Full supervision/cueing for compensatory strategies Compensations: Slow rate;Small sips/bites Postural Changes and/or Swallow Maneuvers: Seated upright 90 degrees;Upright 30-60 min after meal             Oral Care Recommendations: Oral care BID;Staff/trained caregiver to provide oral care Follow up Recommendations:  Skilled Nursing facility Plan: Continue with current plan of care     Thank you,  Havery Moros, CCC-SLP [phone]                 Kordel Leavy 05/24/2015, 3:25 PM

## 2015-05-24 NOTE — Telephone Encounter (Signed)
Attempted to return call.  No answer.  Will send message back to patient via e-mail.

## 2015-05-24 NOTE — Progress Notes (Signed)
Subjective: Patient feels better. She was evaluated by speech therapist and recommended to be on puree diet. Her cough is better.  Objective: Vital signs in last 24 hours: Temp:  [97.8 F (36.6 C)] 97.8 F (36.6 C) (05/02 2218) Pulse Rate:  [83] 83 (05/02 2218) Resp:  [20-26] 26 (05/03 0542) BP: (121-133)/(66-67) 121/67 mmHg (05/03 0542) SpO2:  [88 %-98 %] 88 % (05/03 0802) Weight change:  Last BM Date: 05/22/15  Intake/Output from previous day: 05/02 0701 - 05/03 0700 In: 0  Out: 800 [Urine:800]  PHYSICAL EXAM General appearance: mild distress and slowed mentation Resp: diminished breath sounds bilaterally and rhonchi bilaterally Cardio: irregularly irregular rhythm GI: soft, non-tender; bowel sounds normal; no masses,  no organomegaly Extremities: extremities normal, atraumatic, no cyanosis or edema  Lab Results:  Results for orders placed or performed during the hospital encounter of 05/22/15 (from the past 48 hour(s))  CBC with Differential     Status: Abnormal   Collection Time: 05/23/15 12:26 AM  Result Value Ref Range   WBC 7.6 4.0 - 10.5 K/uL   RBC 2.88 (L) 3.87 - 5.11 MIL/uL   Hemoglobin 9.1 (L) 12.0 - 15.0 g/dL   HCT 40.1 (L) 20.3 - 58.1 %   MCV 93.8 78.0 - 100.0 fL   MCH 31.6 26.0 - 34.0 pg   MCHC 33.7 30.0 - 36.0 g/dL   RDW 36.2 (H) 52.2 - 61.6 %   Platelets 180 150 - 400 K/uL   Neutrophils Relative % 78 %   Neutro Abs 6.0 1.7 - 7.7 K/uL   Lymphocytes Relative 10 %   Lymphs Abs 0.7 0.7 - 4.0 K/uL   Monocytes Relative 10 %   Monocytes Absolute 0.8 0.1 - 1.0 K/uL   Eosinophils Relative 2 %   Eosinophils Absolute 0.1 0.0 - 0.7 K/uL   Basophils Relative 0 %   Basophils Absolute 0.0 0.0 - 0.1 K/uL  Basic metabolic panel     Status: Abnormal   Collection Time: 05/23/15 12:26 AM  Result Value Ref Range   Sodium 140 135 - 145 mmol/L   Potassium 2.8 (L) 3.5 - 5.1 mmol/L   Chloride 103 101 - 111 mmol/L   CO2 28 22 - 32 mmol/L   Glucose, Bld 116 (H) 65 - 99  mg/dL   BUN 9 6 - 20 mg/dL   Creatinine, Ser 7.46 0.44 - 1.00 mg/dL   Calcium 8.3 (L) 8.9 - 10.3 mg/dL   GFR calc non Af Amer 49 (L) >60 mL/min   GFR calc Af Amer 57 (L) >60 mL/min    Comment: (NOTE) The eGFR has been calculated using the CKD EPI equation. This calculation has not been validated in all clinical situations. eGFR's persistently <60 mL/min signify possible Chronic Kidney Disease.    Anion gap 9 5 - 15    ABGS No results for input(s): PHART, PO2ART, TCO2, HCO3 in the last 72 hours.  Invalid input(s): PCO2 CULTURES Recent Results (from the past 240 hour(s))  MRSA PCR Screening     Status: None   Collection Time: 05/16/15  9:30 AM  Result Value Ref Range Status   MRSA by PCR NEGATIVE NEGATIVE Final    Comment:        The GeneXpert MRSA Assay (FDA approved for NASAL specimens only), is one component of a comprehensive MRSA colonization surveillance program. It is not intended to diagnose MRSA infection nor to guide or monitor treatment for MRSA infections.    Studies/Results: Dg Chest Portable  1 View  05/23/2015  CLINICAL DATA:  Patient aspirated at breakfast yesterday and has a cough since then. Generalized weakness. EXAM: PORTABLE CHEST 1 VIEW COMPARISON:  05/14/2015 FINDINGS: Cardiac pacemaker. Diffuse enlargement of the cardiac silhouette which could indicate cardiac enlargement or pericardial effusion. No significant vascular congestion. There are small bilateral pleural effusions with atelectasis or infiltration suggested in both lung bases. Changes could indicate pneumonia. Large esophageal hiatal hernia behind the heart. Calcified and tortuous aorta. No pneumothorax. IMPRESSION: Enlarged cardiac silhouette. Small bilateral pleural effusions with basilar atelectasis or infiltration. Large esophageal hiatal hernia behind the heart. Electronically Signed   By: Lucienne Capers M.D.   On: 05/23/2015 00:29    Medications: I have reviewed the patient's current  medications.  Assesment:   Principal Problem:   Hospital acquired PNA Active Problems:   Cardiac pacemaker in situ   Systolic murmur   Atrial fibrillation (HCC)   Weakness generalized   GI bleed   HCAP (healthcare-associated pneumonia)   Aspiration pneumonia of both lower lobes ([place])   Palliative care encounter   DNR (do not resuscitate) discussion    Plan:  Medications reviewed Continue iv antibiotics Continue puree diet Continue current treament    LOS: 1 day   Anayansi Rundquist 05/24/2015, 12:46 PM

## 2015-05-24 NOTE — Care Management Important Message (Signed)
Important Message  Patient Details  Name: [name] MRN: [id number] Date of Birth: 1920/03/16   Medicare Important Message Given:  Yes    Adonis Huguenin, RN 05/24/2015, 1:44 PM

## 2015-05-25 NOTE — Progress Notes (Signed)
Subjective: Patient is tolerating puree diet. Her breathing is improving and less cough. No fever or chills. She no energy..  Objective: Vital signs in last 24 hours: Temp:  [97.8 F (36.6 C)-98.2 F (36.8 C)] 97.8 F (36.6 C) (05/04 0548) Pulse Rate:  [64-88] 76 (05/04 0751) Resp:  [20-22] 20 (05/04 0548) BP: (104-137)/(52-77) 137/53 mmHg (05/04 0548) SpO2:  [88 %-94 %] 89 % (05/04 0751) Weight change:  Last BM Date: 05/22/15  Intake/Output from previous day: 05/03 0701 - 05/04 0700 In: 57 [P.O.:420] Out: -   PHYSICAL EXAM General appearance: mild distress and slowed mentation Resp: diminished breath sounds bilaterally and rhonchi bilaterally Cardio: irregularly irregular rhythm GI: soft, non-tender; bowel sounds normal; no masses,  no organomegaly Extremities: extremities normal, atraumatic, no cyanosis or edema  Lab Results:  Results for orders placed or performed during the hospital encounter of 05/22/15 (from the past 48 hour(s))  Basic metabolic panel     Status: Abnormal   Collection Time: 05/24/15  1:34 PM  Result Value Ref Range   Sodium 138 135 - 145 mmol/L   Potassium 3.5 3.5 - 5.1 mmol/L    Comment: DELTA CHECK NOTED   Chloride 104 101 - 111 mmol/L   CO2 27 22 - 32 mmol/L   Glucose, Bld 156 (H) 65 - 99 mg/dL   BUN 10 6 - 20 mg/dL   Creatinine, Ser 0.81 0.44 - 1.00 mg/dL   Calcium 8.2 (L) 8.9 - 10.3 mg/dL   GFR calc non Af Amer >60 >60 mL/min   GFR calc Af Amer >60 >60 mL/min    Comment: (NOTE) The eGFR has been calculated using the CKD EPI equation. This calculation has not been validated in all clinical situations. eGFR's persistently <60 mL/min signify possible Chronic Kidney Disease.    Anion gap 7 5 - 15    ABGS No results for input(s): PHART, PO2ART, TCO2, HCO3 in the last 72 hours.  Invalid input(s): PCO2 CULTURES Recent Results (from the past 240 hour(s))  MRSA PCR Screening     Status: None   Collection Time: 05/16/15  9:30 AM  Result  Value Ref Range Status   MRSA by PCR NEGATIVE NEGATIVE Final    Comment:        The GeneXpert MRSA Assay (FDA approved for NASAL specimens only), is one component of a comprehensive MRSA colonization surveillance program. It is not intended to diagnose MRSA infection nor to guide or monitor treatment for MRSA infections.    Studies/Results: No results found.  Medications: I have reviewed the patient's current medications.  Assesment:   Principal Problem:   Hospital acquired PNA Active Problems:   Cardiac pacemaker in situ   Systolic murmur   Atrial fibrillation (HCC)   Weakness generalized   GI bleed   HCAP (healthcare-associated pneumonia)   Aspiration pneumonia of both lower lobes ([place])   Palliative care encounter   DNR (do not resuscitate) discussion    Plan:  Medications reviewed Continue iv antibiotics Continue puree diet Physical therapy consult    LOS: 2 days   Kalene Cutler 05/25/2015, 8:00 AM

## 2015-05-25 NOTE — Evaluation (Signed)
Physical Therapy Evaluation Patient Details Name: [name] MRN: [id number] DOB: 18-Sep-1920 Today's Date: 05/25/2015   History of Present Illness  [name] is a 82 y.o. female with medical history significant of DJD, HTN, systolic murmur, cardiac pacemaker, and atrial fibrillation presented with daughter and brother with complaints of SOB secondary to cough that onset around 9am this morning. Per daughter bedside she was having breakfast this morning and was noted to have intermittent cough through out the day. Daughter denies any swallow studies. She does report recent recurrence of issues with eating and drinking. 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 Daughter also reported a speech evaluation was recommended but has not yet been done.   Clinical Impression  Pt received in bed, daughters present, and pt is agreeable to PT evaluation.  Pt was recently admitted 1 week ago for a GI bleed, and had d/c home with family to assist, and recommendations for HHPT.  Pt and family stated that she was able to transfer from her bed<>BSC independently just prior to admission, however she had not done much walking.  Today, pt required Min A for supine<>sit, and Mod A for bed<>chair transfer.  Pt was extremely fatigued with this transfer, however she states that she feels "fine."  Pt with audible "wheezing," however, states that she does not feel SOB.  Recommend SNF at this time, however pt state that she wants to go home in the care of her children.  PT informed pt that if she decides to go home, then recommendations would be for continued 24/7 assistance/supervision, and HHPT.     Follow Up Recommendations SNF;Home health PT;Supervision/Assistance - 24 hour    Equipment Recommendations       Recommendations for Other Services       Precautions / Restrictions Precautions Precautions: Fall Restrictions Weight Bearing Restrictions:  No      Mobility  Bed Mobility Overal bed mobility: Needs Assistance Bed Mobility: Supine to Sit     Supine to sit: Min assist;HOB elevated     General bed mobility comments: Pt requires increased time and assistance to raise trunk. Pt was able to sit on the EOB for ~5 min while getting the room set up for transfer to the chair.    Transfers Overall transfer level: Needs assistance Equipment used: None Transfers: Stand Pivot Transfers   Stand pivot transfers: Min assist;Mod assist       General transfer comment: Pt was able to transfer bed<>chair.  Pt seems very fatigued with this transfer, however states that she feels "fine."  Pt also with audible wheezing during transfer.  Ambulation/Gait                Stairs            Wheelchair Mobility    Modified Rankin (Stroke Patients Only)       Balance Overall balance assessment: Needs assistance Sitting-balance support: Bilateral upper extremity supported Sitting balance-Leahy Scale: Fair Sitting balance - Comments: Pt demonstrates poor seated posture with increased thoracic kyphosis, and decreased lumbar lordosis with fwd head posture.    Standing balance support: Bilateral upper extremity supported Standing balance-Leahy Scale: Poor                               Pertinent Vitals/Pain Pain Assessment: No/denies pain    Home Living   Living Arrangements: Children (  Pt's children have been providing 24/7 assistance and supervision at her home since last d/c. ) Available Help at Discharge: Family Type of Home: House Home Access: Stairs to enter   [employer] of Steps: 1 Home Layout: One level Home Equipment: Walker - 2 wheels;Cane - single point;Bedside commode      Prior Function Level of Independence: Needs assistance   Gait / Transfers Assistance Needed: Pt and children confirm that she has not been walking much at home, but she has been able to transfer from the  bed<>BSC on her own with a RW.   ADL's / Homemaking Assistance Needed: Children to assist with all household activities.         Hand Dominance        Extremity/Trunk Assessment   Upper Extremity Assessment: Generalized weakness           Lower Extremity Assessment: Generalized weakness         Communication   Communication: No difficulties  Cognition Arousal/Alertness: Awake/alert Behavior During Therapy: WFL for tasks assessed/performed Overall Cognitive Status: Within Functional Limits for tasks assessed                      General Comments      Exercises        Assessment/Plan    PT Assessment Patient needs continued PT services  PT Diagnosis Generalized weakness   PT Problem List Decreased strength;Decreased activity tolerance;Decreased balance;Decreased mobility;Decreased safety awareness;Cardiopulmonary status limiting activity  PT Treatment Interventions DME instruction;Gait training;Functional mobility training;Therapeutic activities;Therapeutic exercise;Balance training;Patient/family education   PT Goals (Current goals can be found in the Care Plan section) Acute Rehab PT Goals Patient Stated Goal: Pt states she wants to go home. PT Goal Formulation: With patient/family Time For Goal Achievement: 06/08/15 Potential to Achieve Goals: Fair    Frequency Min 3X/week   Barriers to discharge        Co-evaluation               End of Session Equipment Utilized During Treatment: Gait belt Activity Tolerance: Patient limited by fatigue Patient left: in chair;with call bell/phone within reach;with nursing/sitter in room Nurse Communication: Mobility status    Functional Assessment Tool Used: [employer] "6-clicks" Functional Limitation: Mobility: Walking and moving around Mobility: Walking and Moving Around Current Status [phone]): At least 60 percent but less than 80 percent impaired, limited or restricted Mobility:  Walking and Moving Around Goal Status [phone]): At least 40 percent but less than 60 percent impaired, limited or restricted    Time: 1354-1415 PT Time Calculation (min) (ACUTE ONLY): 21 min   Charges:   PT Evaluation $PT Eval Moderate Complexity: 1 Procedure     PT G Codes:   PT G-Codes **NOT FOR INPATIENT CLASS** Functional Assessment Tool Used: [employer] "6-clicks" Functional Limitation: Mobility: Walking and moving around Mobility: Walking and Moving Around Current Status [phone]): At least 60 percent but less than 80 percent impaired, limited or restricted Mobility: Walking and Moving Around Goal Status [phone]): At least 40 percent but less than 60 percent impaired, limited or restricted    Carollee Herter, PT, DPT X: [id number]   05/25/2015, 3:41 PM

## 2015-05-25 NOTE — Progress Notes (Signed)
Daily Progress Note   Patient Name: [name]       Date: 05/25/2015 DOB: 19-Mar-1920  Age: 82 y.o. MRN#: [id number] Attending Physician: Avon Gully, MD Primary Care Physician: Avon Gully, MD Admit Date: 05/22/2015  Reason for Consultation/Follow-up: Establishing goals of care and Psychosocial/spiritual support  Subjective: Mrs. Pingleton is lying quietly in bed with her daughter [name] and son Jennye Moccasin at bedside. She makes eye contact and greets me as I enter. She still has a productive cough.  She tells me that she's been eating, "as much as I want". We talk about the importance of eating enough protein to help her heal.  We talk about continued speech therapy visits, and physical therapy visits. I share that she looks weak today, and daughter [name] shares that she feels it would help Mrs. Brashears to get up to the chair. We talk about disposition, and home health versus rehab. These are discussed in detail with [name] and [employer].  [name] and Sterling share that someone has been with Mrs. Treece 24/7 since a few weeks before her first hospitalization. They share that this will continue.  We talk about healthcare power of attorney, and Mrs. Beecher again shares today that her adult children should make decisions for her if she is unable. We also talk about advanced directives. Mrs. Campus shares that she is not been able to give much thought to what she does and does not want. I share with [name] and Jennye Moccasin my concerns regarding CPR and intubation. As I talk about these choices I see Sterling shaking his head no. [name] states that doing these things to her mother would not be her choice, but that she will abide by her mother's wishes.  [name] and [employer] share that they will talk about these choices with their mother.  Mrs. Shore states that she puts her faith in the [place] and she trusts that things will work out for her. She agrees that her children can make these choices for her.  Length of Stay: 2  Current Medications: Scheduled Meds:  . amiodarone  200 mg Oral Daily  . amLODipine  5 mg Oral Daily  . guaiFENesin-codeine  5 mL Oral QID  . levalbuterol  0.63 mg Nebulization BID  . lisinopril  40 mg Oral Daily  . pantoprazole  40 mg Oral  BID AC  . piperacillin-tazobactam (ZOSYN)  IV  3.375 g Intravenous Q8H  . pravastatin  40 mg Oral Daily  . vancomycin  1,000 mg Intravenous Q24H    Continuous Infusions: . dextrose 5 % and 0.9% NaCl 50 mL/hr at 05/25/15 0604    PRN Meds: ALPRAZolam, ondansetron **OR** ondansetron (ZOFRAN) IV  Physical Exam  Constitutional: She is oriented to person, place, and time. No distress.  Pulmonary/Chest:  Continues with work of breathing. Productive cough.   Abdominal: Soft. She exhibits no distension. There is no guarding.  Neurological: She is alert and oriented to person, place, and time.  Skin: Skin is warm and dry.  Nursing note and vitals reviewed.           Vital Signs: BP 125/54 mmHg  Pulse 80  Temp(Src) 98.2 F (36.8 C) (Oral)  Resp 22  Ht 5\' 3"  (1.6 m)  Wt 69.8 kg (153 lb 14.1 oz)  BMI 27.27 kg/m2  SpO2 91% SpO2: SpO2: 91 % O2 Device: O2 Device: Nasal Cannula O2 Flow Rate: O2 Flow Rate (L/min): 3 L/min  Intake/output summary:  Intake/Output Summary (Last 24 hours) at 05/25/15 1359 Last data filed at 05/25/15 0800  Gross per 24 hour  Intake    340 ml  Output      0 ml  Net    340 ml   LBM: Last BM Date: 05/22/15 Baseline Weight: Weight: 69.8 kg (153 lb 14.1 oz) Most recent weight: Weight: 69.8 kg (153 lb 14.1 oz)       Palliative Assessment/Data:    Flowsheet Rows        Most Recent Value   Intake Tab    Unit at Time of Referral  Med/Surg Unit   Palliative Care Primary Diagnosis  Cancer   Date Notified  05/23/15   Palliative Care  Type  New Palliative care   Reason for referral  Advance Care Planning, Clarify Goals of Care   Date of Admission  05/22/15   Date first seen by Palliative Care  05/23/15   # of days Palliative referral response time  0 Day(s)   # of days IP prior to Palliative referral  1   Clinical Assessment    Palliative Performance Scale Score  40%   Pain Max last 24 hours  Not able to report   Pain Min Last 24 hours  Not able to report   Dyspnea Max Last 24 Hours  Not able to report   Dyspnea Min Last 24 hours  Not able to report   Psychosocial & Spiritual Assessment    Palliative Care Outcomes    Patient/Family meeting held?  Yes   Who was at the meeting?  patient, daughter Lance Bosch, DIL [facility]   Palliative Care Outcomes  Clarified goals of care, Provided advance care planning, Provided psychosocial or spiritual support   Palliative Care follow-up planned  -- [follow up at [facility]]      Patient Active Problem List   Diagnosis Date Noted  . Hospital acquired PNA 05/23/2015  . HCAP (healthcare-associated pneumonia) 05/23/2015  . Aspiration pneumonia of both lower lobes (HCC)   . Palliative care encounter   . DNR (do not resuscitate) discussion   . Acute GI bleeding   . Severe anemia 05/14/2015  . Melena 05/14/2015  . Weakness generalized 05/14/2015  . GI bleed 05/14/2015  . Atrial fibrillation (HCC) 04/26/2015  . Severe tricuspid regurgitation 03/15/2015  . CHF (congestive heart failure) (HCC) 03/08/2015  . Systolic murmur  03/08/2015  . CAD (coronary artery disease) 05/25/2014  . DYSLIPIDEMIA 03/14/2010  . Essential hypertension, benign 03/20/2009  . BRADYCARDIA 03/20/2009  . Cardiac pacemaker in situ 03/20/2009    Palliative Care Assessment & Plan   Patient Profile: Mrs. Jambor is and 82 year old female with a history of hypertension recent duodenal ulcer and early-stage dementia.  Assessment: As above  Recommendations/Plan:  Treat the treatable, continue with current  treatment plan. We continue to discuss disposition, and the availability of rehab versus home health services.  We continue to discuss advanced directives.  Goals of Care and Additional Recommendations:  Limitations on Scope of Treatment: Treat the treatable at this time.  Code Status:    Code Status Orders        Start     Ordered   05/23/15 0405  Full code   Continuous     05/23/15 0404    Code Status History    Date Active Date Inactive Code Status Order ID Comments User Context   05/14/2015 11:59 AM 05/19/2015  2:18 PM Full Code [id number]  Henderson Cloud, MD Inpatient       Prognosis:   < 6 months, likely due to continued frailty and decline and poor functional status.  Discharge Planning:  To Be Determined, we discuss rehab for strength training and mobility, versus returning to her home with home health services.  Care plan was discussed with nursing staff, CM, SW, and Dr. Felecia Shelling on next rounds.   Thank you for allowing the Palliative Medicine Team to assist in the care of this patient.   Time In: 1015 Time Out: 1050 Total Time  35 minutes Prolonged Time Billed  no       Greater than 50%  of this time was spent counseling and coordinating care related to the above assessment and plan.  Jimmi Sidener A, NP  Please contact Palliative Medicine Team phone at [phone] for questions and concerns.

## 2015-05-26 LAB — BASIC METABOLIC PANEL
ANION GAP: 5 (ref 5–15)
BUN: 10 mg/dL (ref 6–20)
CHLORIDE: 104 mmol/L (ref 101–111)
CO2: 30 mmol/L (ref 22–32)
Calcium: 8.5 mg/dL — ABNORMAL LOW (ref 8.9–10.3)
Creatinine, Ser: 0.76 mg/dL (ref 0.44–1.00)
GFR calc non Af Amer: >60 mL/min (ref >60)
Glucose, Bld: 113 mg/dL — ABNORMAL HIGH (ref 65–99)
POTASSIUM: 3.4 mmol/L — AB (ref 3.5–5.1)
Sodium: 139 mmol/L (ref 135–145)

## 2015-05-26 LAB — CBC
HEMATOCRIT: 29.1 % — AB (ref 36.0–46.0)
Hemoglobin: 9.2 g/dL — ABNORMAL LOW (ref 12.0–15.0)
MCH: 30.8 pg (ref 26.0–34.0)
MCHC: 31.6 g/dL (ref 30.0–36.0)
MCV: 97.3 fL (ref 78.0–100.0)
PLATELETS: 199 10*3/uL (ref 150–400)
RBC: 2.99 MIL/uL — ABNORMAL LOW (ref 3.87–5.11)
RDW: 15.4 % (ref 11.5–15.5)
WBC: 6.1 10*3/uL (ref 4.0–10.5)

## 2015-05-26 LAB — VANCOMYCIN, TROUGH: Vancomycin Tr: 8 ug/mL — ABNORMAL LOW (ref 10.0–20.0)

## 2015-05-26 MED ORDER — FERROUS FUMARATE 324 (106 FE) MG PO TABS
1.0000 | ORAL_TABLET | Freq: Two times a day (BID) | ORAL | Status: DC
  Administered 2015-05-26 – 2015-06-03 (×17): 106 mg via ORAL
  Filled 2015-05-26 (×17): qty 1

## 2015-05-26 MED ORDER — POTASSIUM CHLORIDE CRYS ER 20 MEQ PO TBCR
20.0000 meq | EXTENDED_RELEASE_TABLET | Freq: Two times a day (BID) | ORAL | Status: DC
  Administered 2015-05-26 – 2015-06-03 (×17): 20 meq via ORAL
  Filled 2015-05-26 (×17): qty 1

## 2015-05-26 MED ORDER — GUAIFENESIN ER 600 MG PO TB12
1200.0000 mg | ORAL_TABLET | Freq: Two times a day (BID) | ORAL | Status: DC
  Administered 2015-05-26 – 2015-06-03 (×17): 1200 mg via ORAL
  Filled 2015-05-26 (×17): qty 2

## 2015-05-26 MED ORDER — GUAIFENESIN-CODEINE 100-10 MG/5ML PO SOLN
5.0000 mL | ORAL | Status: DC | PRN
  Administered 2015-05-26 – 2015-06-03 (×3): 5 mL via ORAL
  Filled 2015-05-26 (×3): qty 5

## 2015-05-26 NOTE — Progress Notes (Signed)
Pharmacy Antibiotic Note  [name] is a 82 y.o. female admitted on 05/22/2015 with pneumonia.  Pharmacy has been consulted for Vancomycin and zosyn dosing.  Cultures noncontributory.  Plan: Vancomycin 1000mg  IV every 24 hours.  Goal trough 15-20 mcg/mL. Zosyn 3.375g IV q8h (4 hour infusion).  Check Vancomycin trough level before dose tonight. F/U cultures and deescalate as appropriately indicated  Height: 5\' 3"  (160 cm) Weight: 153 lb 14.1 oz (69.8 kg) IBW/kg (Calculated) : 52.4  Temp (24hrs), Avg:98.3 F (36.8 C), Min:98.2 F (36.8 C), Max:98.4 F (36.9 C)   Recent Labs Lab 05/23/15 0026 05/24/15 1334 05/26/15 0542  WBC 7.6  --  6.1  CREATININE 0.97 0.81 0.76    Estimated Creatinine Clearance: 41.2 mL/min (by C-G formula based on Cr of 0.76).    No Known Allergies  Antimicrobials this admission: Vancomycin 5/2 >>  Zosyn 5/2 >>   Thank you for allowing pharmacy to be a part of this patient's care. Valrie Hart, PharmD Clinical Pharmacist Pager:  [phone] 05/26/2015 1:24 PM   05/26/2015 1:23 PM

## 2015-05-26 NOTE — Care Management Note (Signed)
Case Management Note  Patient Details  Name: [name] MRN: [id number] Date of Birth: [birth year]  Subjective/Objective:Discussed options for discharge planning with patient adult children at the bedside. After reviewing palliative care notes and speaking with attending discussed with family home health options that may lead to smoother transition to palliative or home hospice in the future. Patient family is active in care and has agreed that patient going to SNF is not what they want. Family is in agreement that home is the best place for the patient.  Duaghters [name] , [name] present in room for discussion. Family was given list of [facility] providers for their information and list of  [place] agencies and informed of facilities that can offer home with hospice option. Family agrees that they would like to first start with Home Health and then be able to transition to higher level of care if patient condition continues to decline. There were three HH agencies that provide both [facility] and Hospice care which [facility] and [facility].  Family was interested in [place].  I contacted [name] at [place] and also [name] at [place] adn asked them to contact [name] at [phone] to answer questions that the family had concerning their services.  Also, Patient will need hospital Bed and Home O2. Contacted Dr Margaretmary Dys attending to discuss. Orders placed for DME. Patient will need HH PT, SLP, CSW, RN and Aid at discharge. Attending agrees to write for this.  No decission has been made on choice of [facility] provider as of yet. Pateint is not ready for discharge today.                    Action/Plan: HOme with Home Health. Family will provide 24 hour in home supervision.   Expected Discharge Date:                  Expected Discharge Plan:  Home w Home Health Services  In-House Referral:     Discharge planning Services  CM Consult  Post Acute Care Choice:  Durable Medical  Equipment Choice offered to:  Adult Children  DME Arranged:  Oxygen, Hospital bed DME Agency:  [facility]  HH Arranged:  RN, PT, Nurse's Aide, Speech Therapy, Social Work [facility] Agency:     Status of Service:  In process, will continue to follow  Medicare Important Message Given:  Yes Date Medicare IM Given:    Medicare IM give by:    Date Additional Medicare IM Given:    Additional Medicare Important Message give by:     If discussed at Long Length of Stay Meetings, dates discussed:    Additional Comments:  Adonis Huguenin, RN 05/26/2015, 4:33 PM

## 2015-05-26 NOTE — Progress Notes (Signed)
Speech Language Pathology Treatment: Dysphagia  Patient Details Name: [name] MRN: [id number] DOB: [birth year] Today's Date: 05/26/2015 Time: 9675-9163 SLP Time Calculation (min) (ACUTE ONLY): 24 min  Assessment / Plan / Recommendation Clinical Impression  Pt was seen at bedside to assess tolerance of recommended diet. At baseline patient presents with congested cough with decreased vocal intensity with very raspy and hoarse vocal quality. Pt observed with thin (tea) consumption via straw and puree trials seated upright in bed with continued shortness of breath occasional belching and inconsistent wet vocal quality cleared with cues to cough (also noted at baseline prior to PO trials). Pt's primary risk for aspiration continues to be difficulty coordinating respiration with swallow. PO intake continues to be minimal with continued shortness of breath intensified with PO intake. Aspiration precautions were reviewed with family and patient. Continue with recommended diet and oral care. SLP will follow.   HPI HPI: LATITIA CIMINI is a 82 y.o. female with medical history significant of DJD, HTN, systolic murmur, cardiac pacemaker, and atrial fibrillation presented with daughter and brother with complaints of SOB secondary to cough that onset around 9am this morning. Per daughter bedside she was having breakfast this morning and was noted to have intermittent cough through out the day. Daughter denies any swallow studies. She does report recent recurrence of issues with eating and drinking. 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 Chest x-ray today showed: Small bilateral pleural effusions with basilar atelectasis or infiltration in both lung bases. Large esophageal hiatal hernia behind the heart.      SLP Plan  Continue with current plan of care     Recommendations  Diet recommendations: Dysphagia 1 (puree);Thin liquid Liquids  provided via: Cup;Straw Medication Administration: Whole meds with liquid Supervision: Staff to assist with self feeding;Full supervision/cueing for compensatory strategies Compensations: Slow rate;Small sips/bites Postural Changes and/or Swallow Maneuvers: Seated upright 90 degrees;Upright 30-60 min after meal             Oral Care Recommendations: Oral care BID;Staff/trained caregiver to provide oral care Follow up Recommendations: Skilled Nursing facility Plan: Continue with current plan of care     [place] H. Romie Levee, CCC-SLP Speech Language Pathologist    Georgetta Haber 05/26/2015, 1:49 PM

## 2015-05-26 NOTE — Progress Notes (Signed)
Subjective: She was admitted with healthcare associated pneumonia. She says she's feeling a little bit better. She is still very weak.  Objective: Vital signs in last 24 hours: Temp:  [98.2 F (36.8 C)] 98.2 F (36.8 C) (05/04 1347) Pulse Rate:  [73-80] 73 (05/04 2123) Resp:  [20-22] 20 (05/04 2123) BP: (125-129)/(54-67) 129/67 mmHg (05/04 2123) SpO2:  [90 %-91 %] 91 % (05/04 2123) Weight change:  Last BM Date: 05/22/15  Intake/Output from previous day: 05/04 0701 - 05/05 0700 In: 1126.7 [P.O.:480; I.V.:596.7; IV Piggyback:50] Out: -   PHYSICAL EXAM General appearance: alert, cooperative, mild distress and Week Resp: rhonchi bilaterally Cardio: irregularly irregular rhythm GI: soft, non-tender; bowel sounds normal; no masses,  no organomegaly Extremities: extremities normal, atraumatic, no cyanosis or edema  Lab Results:  Results for orders placed or performed during the hospital encounter of 05/22/15 (from the past 48 hour(s))  Basic metabolic panel     Status: Abnormal   Collection Time: 05/24/15  1:34 PM  Result Value Ref Range   Sodium 138 135 - 145 mmol/L   Potassium 3.5 3.5 - 5.1 mmol/L    Comment: DELTA CHECK NOTED   Chloride 104 101 - 111 mmol/L   CO2 27 22 - 32 mmol/L   Glucose, Bld 156 (H) 65 - 99 mg/dL   BUN 10 6 - 20 mg/dL   Creatinine, Ser 0.81 0.44 - 1.00 mg/dL   Calcium 8.2 (L) 8.9 - 10.3 mg/dL   GFR calc non Af Amer >60 >60 mL/min   GFR calc Af Amer >60 >60 mL/min    Comment: (NOTE) The eGFR has been calculated using the CKD EPI equation. This calculation has not been validated in all clinical situations. eGFR's persistently <60 mL/min signify possible Chronic Kidney Disease.    Anion gap 7 5 - 15  CBC     Status: Abnormal   Collection Time: 05/26/15  5:42 AM  Result Value Ref Range   WBC 6.1 4.0 - 10.5 K/uL   RBC 2.99 (L) 3.87 - 5.11 MIL/uL   Hemoglobin 9.2 (L) 12.0 - 15.0 g/dL   HCT 29.1 (L) 36.0 - 46.0 %   MCV 97.3 78.0 - 100.0 fL   MCH  30.8 26.0 - 34.0 pg   MCHC 31.6 30.0 - 36.0 g/dL   RDW 15.4 11.5 - 15.5 %   Platelets 199 150 - 400 K/uL  Basic metabolic panel     Status: Abnormal   Collection Time: 05/26/15  5:42 AM  Result Value Ref Range   Sodium 139 135 - 145 mmol/L   Potassium 3.4 (L) 3.5 - 5.1 mmol/L   Chloride 104 101 - 111 mmol/L   CO2 30 22 - 32 mmol/L   Glucose, Bld 113 (H) 65 - 99 mg/dL   BUN 10 6 - 20 mg/dL   Creatinine, Ser 0.76 0.44 - 1.00 mg/dL   Calcium 8.5 (L) 8.9 - 10.3 mg/dL   GFR calc non Af Amer >60 >60 mL/min   GFR calc Af Amer >60 >60 mL/min    Comment: (NOTE) The eGFR has been calculated using the CKD EPI equation. This calculation has not been validated in all clinical situations. eGFR's persistently <60 mL/min signify possible Chronic Kidney Disease.    Anion gap 5 5 - 15    ABGS No results for input(s): PHART, PO2ART, TCO2, HCO3 in the last 72 hours.  Invalid input(s): PCO2 CULTURES No results found for this or any previous visit (from the past 240 hour(s)).  Studies/Results: No results found.  Medications:  Prior to Admission:  Prescriptions prior to admission  Medication Sig Dispense Refill Last Dose  . acetaminophen (TYLENOL) 650 MG CR tablet Take 650 mg by mouth every 8 (eight) hours as needed for pain.    Past Week at Unknown time  . amiodarone (PACERONE) 200 MG tablet Take 1 tablet (200 mg total) by mouth daily. 90 tablet 3 05/22/2015 at Unknown time  . amLODipine (NORVASC) 5 MG tablet Take 1 tablet (5 mg total) by mouth daily. 90 tablet 3 05/22/2015 at Unknown time  . calcium-vitamin D (OSCAL WITH D) 500-200 MG-UNIT per tablet Take 1 tablet by mouth daily.     05/22/2015 at Unknown time  . ferrous sulfate 325 (65 FE) MG EC tablet Take 65 mg by mouth 3 (three) times daily with meals.   05/22/2015 at Unknown time  . furosemide (LASIX) 40 MG tablet Take 1 tablet (40 mg total) by mouth daily. (Patient taking differently: Take 20 mg by mouth daily. Decreased to 20 mg daily. May take  an extra 20 mg if leg swelling/weight gain occurs.) 90 tablet 3 Past Week at Unknown time  . lisinopril (PRINIVIL,ZESTRIL) 40 MG tablet Take 1 tablet (40 mg total) by mouth daily. 90 tablet 0 05/22/2015 at Unknown time  . pantoprazole (PROTONIX) 40 MG tablet Take 1 tablet (40 mg total) by mouth 2 (two) times daily before a meal. 60 tablet 3 05/22/2015 at Unknown time  . potassium chloride SA (K-DUR,KLOR-CON) 20 MEQ tablet Take 1 tablet (20 mEq total) by mouth daily. 90 tablet 3 05/22/2015 at Unknown time  . pravastatin (PRAVACHOL) 40 MG tablet Take 40 mg by mouth daily.   05/22/2015 at Unknown time   Scheduled: . amiodarone  200 mg Oral Daily  . amLODipine  5 mg Oral Daily  . Ferrous Fumarate  1 tablet Oral BID  . guaiFENesin  1,200 mg Oral BID  . levalbuterol  0.63 mg Nebulization BID  . lisinopril  40 mg Oral Daily  . pantoprazole  40 mg Oral BID AC  . piperacillin-tazobactam (ZOSYN)  IV  3.375 g Intravenous Q8H  . potassium chloride  20 mEq Oral BID  . pravastatin  40 mg Oral Daily  . vancomycin  1,000 mg Intravenous Q24H   Continuous: . dextrose 5 % and 0.9% NaCl 50 mL/hr at 05/25/15 0604   VVO:HYWVPXTGGY, guaiFENesin-codeine, ondansetron **OR** ondansetron (ZOFRAN) IV  Assesment:She was admitted with healthcare associated pneumonia likely from aspiration. She is generally weak but has worked some with physical therapy now. She has chronic atrial fib which is stable. A recommendation has been made that she go to skilled care facility but she is not interested in that. I discussed this with her daughter at bedside and with the patient in a plan to try to go home. She is not ready for discharge Principal Problem:   Hospital acquired PNA Active Problems:   Cardiac pacemaker in situ   Systolic murmur   Atrial fibrillation (HCC)   Weakness generalized   GI bleed   HCAP (healthcare-associated pneumonia)   Aspiration pneumonia of both lower lobes ([place])   Palliative care encounter   DNR (do  not resuscitate) discussion    Plan: Add potassium. Add Iron.    LOS: 3 days   HAWKINS,EDWARD L 05/26/2015, 9:32 AM

## 2015-05-26 NOTE — Care Management Important Message (Signed)
Important Message  Patient Details  Name: [name] MRN: [id number] Date of Birth: Feb 13, 1920   Medicare Important Message Given:  Yes    Adonis Huguenin, RN 05/26/2015, 2:40 PM

## 2015-05-27 LAB — CBC WITH DIFFERENTIAL/PLATELET
Basophils Absolute: 0 10*3/uL (ref 0.0–0.1)
Basophils Relative: 0 %
EOS ABS: 0.1 10*3/uL (ref 0.0–0.7)
EOS PCT: 1 %
HCT: 29.2 % — ABNORMAL LOW (ref 36.0–46.0)
Hemoglobin: 9.2 g/dL — ABNORMAL LOW (ref 12.0–15.0)
LYMPHS ABS: 0.5 10*3/uL — AB (ref 0.7–4.0)
Lymphocytes Relative: 10 %
MCH: 30.6 pg (ref 26.0–34.0)
MCHC: 31.5 g/dL (ref 30.0–36.0)
MCV: 97 fL (ref 78.0–100.0)
MONOS PCT: 10 %
Monocytes Absolute: 0.5 10*3/uL (ref 0.1–1.0)
Neutro Abs: 3.7 10*3/uL (ref 1.7–7.7)
Neutrophils Relative %: 79 %
PLATELETS: 211 10*3/uL (ref 150–400)
RBC: 3.01 MIL/uL — ABNORMAL LOW (ref 3.87–5.11)
RDW: 14.9 % (ref 11.5–15.5)
WBC: 4.8 10*3/uL (ref 4.0–10.5)

## 2015-05-27 LAB — BASIC METABOLIC PANEL
Anion gap: 6 (ref 5–15)
BUN: 9 mg/dL (ref 6–20)
CHLORIDE: 104 mmol/L (ref 101–111)
CO2: 30 mmol/L (ref 22–32)
CREATININE: 0.65 mg/dL (ref 0.44–1.00)
Calcium: 8.7 mg/dL — ABNORMAL LOW (ref 8.9–10.3)
GFR calc Af Amer: >60 mL/min (ref >60)
GFR calc non Af Amer: >60 mL/min (ref >60)
GLUCOSE: 121 mg/dL — AB (ref 65–99)
Potassium: 4.1 mmol/L (ref 3.5–5.1)
Sodium: 140 mmol/L (ref 135–145)

## 2015-05-27 MED ORDER — VANCOMYCIN HCL 10 G IV SOLR
1250.0000 mg | INTRAVENOUS | Status: DC
  Administered 2015-05-27 – 2015-05-29 (×3): 1250 mg via INTRAVENOUS
  Filled 2015-05-27 (×4): qty 1250

## 2015-05-27 MED ORDER — LEVALBUTEROL HCL 0.63 MG/3ML IN NEBU
INHALATION_SOLUTION | RESPIRATORY_TRACT | Status: AC
Start: 2015-05-27 — End: 2015-05-27
  Filled 2015-05-27: qty 3

## 2015-05-27 MED ORDER — LEVALBUTEROL HCL 0.63 MG/3ML IN NEBU
0.6300 mg | INHALATION_SOLUTION | RESPIRATORY_TRACT | Status: DC | PRN
  Administered 2015-05-27: 0.63 mg via RESPIRATORY_TRACT
  Filled 2015-05-27: qty 3

## 2015-05-27 NOTE — Progress Notes (Signed)
Pharmacy Antibiotic Note  [name] is a 82 y.o. female admitted on 05/22/2015 with pneumonia.  Pharmacy has been consulted for Vancomycin and zosyn dosing.   Vancomycin tough below goa  Plan: Change Vancomycin to 1250 mg IV every 24 hours. Goal trough 15-20 mcg/ml Vancomycin trough at steady state Continue Zosyn 3.375 gm IV every 8 hours (4 hr infusion) F/U cultures and deescalate as appropriately indicated  Height: 5\' 3"  (160 cm) Weight: 153 lb 14.1 oz (69.8 kg) IBW/kg (Calculated) : 52.4  Temp (24hrs), Avg:98.4 F (36.9 C), Min:98.4 F (36.9 C), Max:98.4 F (36.9 C)   Recent Labs Lab 05/23/15 0026 05/24/15 1334 05/26/15 0542 05/26/15 2100  WBC 7.6  --  6.1  --   CREATININE 0.97 0.81 0.76  --   VANCOTROUGH  --   --   --  8*    Estimated Creatinine Clearance: 41.2 mL/min (by C-G formula based on Cr of 0.76).    No Known Allergies  Antimicrobials this admission: Vancomycin 5/2 >>  Zosyn 5/2 >>   Thank you for allowing pharmacy to be a part of this patient's care. Clyde Canterbury [facility] Clinical Pharmacist  05/27/2015 7:35 AM   05/27/2015 7:35 AM

## 2015-05-27 NOTE — Progress Notes (Signed)
Subjective: She says she still very weak. She is still coughing up sputum. She is still short of breath  Objective: Vital signs in last 24 hours: Temp:  [98.4 F (36.9 C)] 98.4 F (36.9 C) (05/05 1256) Pulse Rate:  [76] 76 (05/05 1256) Resp:  [18] 18 (05/05 1256) BP: (134)/(64) 134/64 mmHg (05/05 1256) SpO2:  [87 %-94 %] 87 % (05/06 0749) Weight change:  Last BM Date: 05/23/15  Intake/Output from previous day: 05/05 0701 - 05/06 0700 In: 480 [P.O.:480] Out: -   PHYSICAL EXAM General appearance: alert, cooperative and Very weak Resp: rhonchi bilaterally Cardio: irregularly irregular rhythm GI: soft, non-tender; bowel sounds normal; no masses,  no organomegaly Extremities: extremities normal, atraumatic, no cyanosis or edema  Lab Results:  Results for orders placed or performed during the hospital encounter of 05/22/15 (from the past 48 hour(s))  CBC     Status: Abnormal   Collection Time: 05/26/15  5:42 AM  Result Value Ref Range   WBC 6.1 4.0 - 10.5 K/uL   RBC 2.99 (L) 3.87 - 5.11 MIL/uL   Hemoglobin 9.2 (L) 12.0 - 15.0 g/dL   HCT 29.1 (L) 36.0 - 46.0 %   MCV 97.3 78.0 - 100.0 fL   MCH 30.8 26.0 - 34.0 pg   MCHC 31.6 30.0 - 36.0 g/dL   RDW 15.4 11.5 - 15.5 %   Platelets 199 150 - 400 K/uL  Basic metabolic panel     Status: Abnormal   Collection Time: 05/26/15  5:42 AM  Result Value Ref Range   Sodium 139 135 - 145 mmol/L   Potassium 3.4 (L) 3.5 - 5.1 mmol/L   Chloride 104 101 - 111 mmol/L   CO2 30 22 - 32 mmol/L   Glucose, Bld 113 (H) 65 - 99 mg/dL   BUN 10 6 - 20 mg/dL   Creatinine, Ser 0.76 0.44 - 1.00 mg/dL   Calcium 8.5 (L) 8.9 - 10.3 mg/dL   GFR calc non Af Amer >60 >60 mL/min   GFR calc Af Amer >60 >60 mL/min    Comment: (NOTE) The eGFR has been calculated using the CKD EPI equation. This calculation has not been validated in all clinical situations. eGFR's persistently <60 mL/min signify possible Chronic Kidney Disease.    Anion gap 5 5 - 15   Vancomycin, trough     Status: Abnormal   Collection Time: 05/26/15  9:00 PM  Result Value Ref Range   Vancomycin Tr 8 (L) 10.0 - 20.0 ug/mL  Basic metabolic panel     Status: Abnormal   Collection Time: 05/27/15  6:54 AM  Result Value Ref Range   Sodium 140 135 - 145 mmol/L   Potassium 4.1 3.5 - 5.1 mmol/L    Comment: DELTA CHECK NOTED   Chloride 104 101 - 111 mmol/L   CO2 30 22 - 32 mmol/L   Glucose, Bld 121 (H) 65 - 99 mg/dL   BUN 9 6 - 20 mg/dL   Creatinine, Ser 0.65 0.44 - 1.00 mg/dL   Calcium 8.7 (L) 8.9 - 10.3 mg/dL   GFR calc non Af Amer >60 >60 mL/min   GFR calc Af Amer >60 >60 mL/min    Comment: (NOTE) The eGFR has been calculated using the CKD EPI equation. This calculation has not been validated in all clinical situations. eGFR's persistently <60 mL/min signify possible Chronic Kidney Disease.    Anion gap 6 5 - 15  CBC with Differential/Platelet     Status: Abnormal  Collection Time: 05/27/15  6:54 AM  Result Value Ref Range   WBC 4.8 4.0 - 10.5 K/uL   RBC 3.01 (L) 3.87 - 5.11 MIL/uL   Hemoglobin 9.2 (L) 12.0 - 15.0 g/dL   HCT 29.2 (L) 36.0 - 46.0 %   MCV 97.0 78.0 - 100.0 fL   MCH 30.6 26.0 - 34.0 pg   MCHC 31.5 30.0 - 36.0 g/dL   RDW 14.9 11.5 - 15.5 %   Platelets 211 150 - 400 K/uL   Neutrophils Relative % 79 %   Neutro Abs 3.7 1.7 - 7.7 K/uL   Lymphocytes Relative 10 %   Lymphs Abs 0.5 (L) 0.7 - 4.0 K/uL   Monocytes Relative 10 %   Monocytes Absolute 0.5 0.1 - 1.0 K/uL   Eosinophils Relative 1 %   Eosinophils Absolute 0.1 0.0 - 0.7 K/uL   Basophils Relative 0 %   Basophils Absolute 0.0 0.0 - 0.1 K/uL    ABGS No results for input(s): PHART, PO2ART, TCO2, HCO3 in the last 72 hours.  Invalid input(s): PCO2 CULTURES No results found for this or any previous visit (from the past 240 hour(s)). Studies/Results: No results found.  Medications:  Prior to Admission:  Prescriptions prior to admission  Medication Sig Dispense Refill Last Dose  .  acetaminophen (TYLENOL) 650 MG CR tablet Take 650 mg by mouth every 8 (eight) hours as needed for pain.    Past Week at Unknown time  . amiodarone (PACERONE) 200 MG tablet Take 1 tablet (200 mg total) by mouth daily. 90 tablet 3 05/22/2015 at Unknown time  . amLODipine (NORVASC) 5 MG tablet Take 1 tablet (5 mg total) by mouth daily. 90 tablet 3 05/22/2015 at Unknown time  . calcium-vitamin D (OSCAL WITH D) 500-200 MG-UNIT per tablet Take 1 tablet by mouth daily.     05/22/2015 at Unknown time  . ferrous sulfate 325 (65 FE) MG EC tablet Take 65 mg by mouth 3 (three) times daily with meals.   05/22/2015 at Unknown time  . furosemide (LASIX) 40 MG tablet Take 1 tablet (40 mg total) by mouth daily. (Patient taking differently: Take 20 mg by mouth daily. Decreased to 20 mg daily. May take an extra 20 mg if leg swelling/weight gain occurs.) 90 tablet 3 Past Week at Unknown time  . lisinopril (PRINIVIL,ZESTRIL) 40 MG tablet Take 1 tablet (40 mg total) by mouth daily. 90 tablet 0 05/22/2015 at Unknown time  . pantoprazole (PROTONIX) 40 MG tablet Take 1 tablet (40 mg total) by mouth 2 (two) times daily before a meal. 60 tablet 3 05/22/2015 at Unknown time  . potassium chloride SA (K-DUR,KLOR-CON) 20 MEQ tablet Take 1 tablet (20 mEq total) by mouth daily. 90 tablet 3 05/22/2015 at Unknown time  . pravastatin (PRAVACHOL) 40 MG tablet Take 40 mg by mouth daily.   05/22/2015 at Unknown time   Scheduled: . amiodarone  200 mg Oral Daily  . amLODipine  5 mg Oral Daily  . Ferrous Fumarate  1 tablet Oral BID  . guaiFENesin  1,200 mg Oral BID  . levalbuterol  0.63 mg Nebulization BID  . lisinopril  40 mg Oral Daily  . pantoprazole  40 mg Oral BID AC  . piperacillin-tazobactam (ZOSYN)  IV  3.375 g Intravenous Q8H  . potassium chloride  20 mEq Oral BID  . pravastatin  40 mg Oral Daily  . vancomycin  1,250 mg Intravenous Q24H   Continuous: . dextrose 5 % and 0.9% NaCl  50 mL/hr at 05/27/15 3887   JLL:[id number],  guaiFENesin-codeine, ondansetron **OR** ondansetron (ZOFRAN) IV  Assesment:She has healthcare associated pneumonia probably from aspiration. She is still very weak. She's not ready for discharge. She still has a lot of congestion in her chest.  She has chronic atrial fib which is stable. Principal Problem:   Hospital acquired PNA Active Problems:   Cardiac pacemaker in situ   Systolic murmur   Atrial fibrillation (HCC)   Weakness generalized   GI bleed   HCAP (healthcare-associated pneumonia)   Aspiration pneumonia of both lower lobes [facility])   Palliative care encounter   DNR (do not resuscitate) discussion    Plan: Continue current treatments.    LOS: 4 days   Fausto Sampedro L 05/27/2015, 11:01 AM

## 2015-05-28 MED ORDER — BISACODYL 10 MG RE SUPP
10.0000 mg | Freq: Every day | RECTAL | Status: DC | PRN
  Administered 2015-05-29: 10 mg via RECTAL
  Filled 2015-05-28: qty 1

## 2015-05-28 MED ORDER — FUROSEMIDE 10 MG/ML IJ SOLN
40.0000 mg | Freq: Once | INTRAMUSCULAR | Status: AC
  Administered 2015-05-28: 40 mg via INTRAVENOUS
  Filled 2015-05-28: qty 4

## 2015-05-28 MED ORDER — FLEET ENEMA 7-19 GM/118ML RE ENEM
1.0000 | ENEMA | Freq: Once | RECTAL | Status: AC
  Administered 2015-05-28: 1 via RECTAL

## 2015-05-28 NOTE — Progress Notes (Signed)
Pt spo2 88% on 15lpm HFNC nurse informed spo2 decreased to 85% with blow breathing treatment

## 2015-05-28 NOTE — Progress Notes (Signed)
2250 Foley catheter inserted as ordered per MD. 27F Foley catheter used, patient tolerated well, standard bag & securing device used. 300cc cloudy, yellow urine noted in collection bag.

## 2015-05-28 NOTE — Progress Notes (Signed)
Subjective: She is still very weak. She is coughing up some sputum. She's not had a bowel movement in some time.  Objective: Vital signs in last 24 hours: Temp:  [97.6 F (36.4 C)] 97.6 F (36.4 C) (05/07 0642) Pulse Rate:  [65-75] 65 (05/07 0642) Resp:  [18] 18 (05/07 0642) BP: (125-151)/(51-73) 151/67 mmHg (05/07 0642) SpO2:  [85 %-95 %] 88 % (05/07 0703) Weight change:  Last BM Date: 05/23/15  Intake/Output from previous day: 05/06 0701 - 05/07 0700 In: 240 [P.O.:240] Out: -   PHYSICAL EXAM General appearance: alert, cooperative and mild distress Resp: rhonchi bilaterally Cardio: regular rate and rhythm, S1, S2 normal, no murmur, click, rub or gallop GI: soft, non-tender; bowel sounds normal; no masses,  no organomegaly Extremities: extremities normal, atraumatic, no cyanosis or edema  Lab Results:  Results for orders placed or performed during the hospital encounter of 05/22/15 (from the past 48 hour(s))  Vancomycin, trough     Status: Abnormal   Collection Time: 05/26/15  9:00 PM  Result Value Ref Range   Vancomycin Tr 8 (L) 10.0 - 20.0 ug/mL  Basic metabolic panel     Status: Abnormal   Collection Time: 05/27/15  6:54 AM  Result Value Ref Range   Sodium 140 135 - 145 mmol/L   Potassium 4.1 3.5 - 5.1 mmol/L    Comment: DELTA CHECK NOTED   Chloride 104 101 - 111 mmol/L   CO2 30 22 - 32 mmol/L   Glucose, Bld 121 (H) 65 - 99 mg/dL   BUN 9 6 - 20 mg/dL   Creatinine, Ser 0.65 0.44 - 1.00 mg/dL   Calcium 8.7 (L) 8.9 - 10.3 mg/dL   GFR calc non Af Amer >60 >60 mL/min   GFR calc Af Amer >60 >60 mL/min    Comment: (NOTE) The eGFR has been calculated using the CKD EPI equation. This calculation has not been validated in all clinical situations. eGFR's persistently <60 mL/min signify possible Chronic Kidney Disease.    Anion gap 6 5 - 15  CBC with Differential/Platelet     Status: Abnormal   Collection Time: 05/27/15  6:54 AM  Result Value Ref Range   WBC 4.8 4.0 -  10.5 K/uL   RBC 3.01 (L) 3.87 - 5.11 MIL/uL   Hemoglobin 9.2 (L) 12.0 - 15.0 g/dL   HCT 29.2 (L) 36.0 - 46.0 %   MCV 97.0 78.0 - 100.0 fL   MCH 30.6 26.0 - 34.0 pg   MCHC 31.5 30.0 - 36.0 g/dL   RDW 14.9 11.5 - 15.5 %   Platelets 211 150 - 400 K/uL   Neutrophils Relative % 79 %   Neutro Abs 3.7 1.7 - 7.7 K/uL   Lymphocytes Relative 10 %   Lymphs Abs 0.5 (L) 0.7 - 4.0 K/uL   Monocytes Relative 10 %   Monocytes Absolute 0.5 0.1 - 1.0 K/uL   Eosinophils Relative 1 %   Eosinophils Absolute 0.1 0.0 - 0.7 K/uL   Basophils Relative 0 %   Basophils Absolute 0.0 0.0 - 0.1 K/uL    ABGS No results for input(s): PHART, PO2ART, TCO2, HCO3 in the last 72 hours.  Invalid input(s): PCO2 CULTURES No results found for this or any previous visit (from the past 240 hour(s)). Studies/Results: No results found.  Medications:  Prior to Admission:  Prescriptions prior to admission  Medication Sig Dispense Refill Last Dose  . acetaminophen (TYLENOL) 650 MG CR tablet Take 650 mg by mouth every 8 (  eight) hours as needed for pain.    Past Week at Unknown time  . amiodarone (PACERONE) 200 MG tablet Take 1 tablet (200 mg total) by mouth daily. 90 tablet 3 05/22/2015 at Unknown time  . amLODipine (NORVASC) 5 MG tablet Take 1 tablet (5 mg total) by mouth daily. 90 tablet 3 05/22/2015 at Unknown time  . calcium-vitamin D (OSCAL WITH D) 500-200 MG-UNIT per tablet Take 1 tablet by mouth daily.     05/22/2015 at Unknown time  . ferrous sulfate 325 (65 FE) MG EC tablet Take 65 mg by mouth 3 (three) times daily with meals.   05/22/2015 at Unknown time  . furosemide (LASIX) 40 MG tablet Take 1 tablet (40 mg total) by mouth daily. (Patient taking differently: Take 20 mg by mouth daily. Decreased to 20 mg daily. May take an extra 20 mg if leg swelling/weight gain occurs.) 90 tablet 3 Past Week at Unknown time  . lisinopril (PRINIVIL,ZESTRIL) 40 MG tablet Take 1 tablet (40 mg total) by mouth daily. 90 tablet 0 05/22/2015 at  Unknown time  . pantoprazole (PROTONIX) 40 MG tablet Take 1 tablet (40 mg total) by mouth 2 (two) times daily before a meal. 60 tablet 3 05/22/2015 at Unknown time  . potassium chloride SA (K-DUR,KLOR-CON) 20 MEQ tablet Take 1 tablet (20 mEq total) by mouth daily. 90 tablet 3 05/22/2015 at Unknown time  . pravastatin (PRAVACHOL) 40 MG tablet Take 40 mg by mouth daily.   05/22/2015 at Unknown time   Scheduled: . amiodarone  200 mg Oral Daily  . amLODipine  5 mg Oral Daily  . Ferrous Fumarate  1 tablet Oral BID  . guaiFENesin  1,200 mg Oral BID  . levalbuterol  0.63 mg Nebulization BID  . lisinopril  40 mg Oral Daily  . pantoprazole  40 mg Oral BID AC  . piperacillin-tazobactam (ZOSYN)  IV  3.375 g Intravenous Q8H  . potassium chloride  20 mEq Oral BID  . pravastatin  40 mg Oral Daily  . sodium phosphate  1 enema Rectal Once  . vancomycin  1,250 mg Intravenous Q24H   Continuous: . dextrose 5 % and 0.9% NaCl 50 mL/hr at 05/27/15 6045   WUJ:[id number], bisacodyl, guaiFENesin-codeine, levalbuterol, ondansetron **OR** ondansetron (ZOFRAN) IV  Assesment: She has healthcare associated pneumonia probably from aspiration. She is generally very weak. She has chronic atrial fibrillation but today her heart seems pretty regular she is constipated Principal Problem:   Hospital acquired PNA Active Problems:   Cardiac pacemaker in situ   Systolic murmur   Atrial fibrillation (HCC)   Weakness generalized   GI bleed   HCAP (healthcare-associated pneumonia)   Aspiration pneumonia of both lower lobes [facility])   Palliative care encounter   DNR (do not resuscitate) discussion    Plan: Continue antibiotics. Continue other treatments. I'm going to give her something for constipation. Considering her fairly poor response to treatment and her overall weakness she may be a candidate for hospice care at home    LOS: 5 days   Marigrace Mccole L 05/28/2015, 11:25 AM

## 2015-05-28 NOTE — Progress Notes (Signed)
Speech Language Pathology Treatment: Dysphagia  Patient Details Name: [name] MRN: [id number] DOB: 1920-02-16 Today's Date: 05/28/2015 Time: 1100-1131 SLP Time Calculation (min) (ACUTE ONLY): 31 min  Assessment / Plan / Recommendation Clinical Impression  [name] was seen sitting up in recliner in room with grandson and his wife present. Pt has no complaints and denies discomfort of difficulty breathing despite being on 15 L O2 via nasal cannula and shallow breathing. Pt may due better with non-rebreather due to mouth breathing, however family reports claustrophobia. Vocal quality clear, however cued cough is congested. Pt agreeable to po trials, specifically sherbet. Although pt with no overt signs of aspiration (coughing/throat clearing/wet vocal quality), she continues to be at risk for aspiration due to respiratory compromise and coordinating swallow with respiration. Recommend continued oral care and offering po per pt desires. Consider offering magic cup as pt seems to like ice cream/sherbet. Aspiration and reflux precautions reviewed with family. SLP will follow.    HPI HPI: [name] is a 81 y.o. female with medical history significant of DJD, HTN, systolic murmur, cardiac pacemaker, and atrial fibrillation presented with daughter and brother with complaints of SOB secondary to cough that onset around 9am this morning. Per daughter bedside she was having breakfast this morning and was noted to have intermittent cough through out the day. Daughter denies any swallow studies. She does report recent recurrence of issues with eating and drinking. 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 Chest x-ray today showed: Small bilateral pleural effusions with basilar atelectasis or infiltration in both lung bases. Large esophageal hiatal hernia behind the heart.      SLP Plan  Continue with current plan of care      Recommendations  Diet recommendations: Dysphagia 1 (puree);Thin liquid Liquids provided via: Cup;Straw;Teaspoon Medication Administration: Whole meds with liquid Supervision: Staff to assist with self feeding;Full supervision/cueing for compensatory strategies Compensations: Slow rate;Small sips/bites Postural Changes and/or Swallow Maneuvers: Seated upright 90 degrees;Upright 30-60 min after meal             Oral Care Recommendations: Oral care BID;Staff/trained caregiver to provide oral care Follow up Recommendations: 24 hour supervision/assistance (pending goals of care) Plan: Continue with current plan of care     GO                Yaslene Lindamood 05/28/2015, 11:36 AM

## 2015-05-28 NOTE — Progress Notes (Signed)
Patient's O2 SAT 85-88% on 15L HFNC O2 and unimproved with breathing Tx per RT. Wet lung sounds, congestion and rhonchi noted. Patient is a FULL CODE. MD on call Dr.Hawkins notified and the following new orders given: 1.) Lasix 40mg  IV push x 1 time only 2.) Insert Foley catheter

## 2015-05-29 ENCOUNTER — Inpatient Hospital Stay (HOSPITAL_COMMUNITY): Payer: Medicare Other

## 2015-05-29 LAB — CBC
HCT: 32.2 % — ABNORMAL LOW (ref 36.0–46.0)
Hemoglobin: 10.3 g/dL — ABNORMAL LOW (ref 12.0–15.0)
MCH: 30.9 pg (ref 26.0–34.0)
MCHC: 32 g/dL (ref 30.0–36.0)
MCV: 96.7 fL (ref 78.0–100.0)
PLATELETS: 260 10*3/uL (ref 150–400)
RBC: 3.33 MIL/uL — AB (ref 3.87–5.11)
RDW: 14.6 % (ref 11.5–15.5)
WBC: 5.5 10*3/uL (ref 4.0–10.5)

## 2015-05-29 LAB — BASIC METABOLIC PANEL WITH GFR
Anion gap: 6 (ref 5–15)
BUN: 7 mg/dL (ref 6–20)
CO2: 35 mmol/L — ABNORMAL HIGH (ref 22–32)
Calcium: 8.8 mg/dL — ABNORMAL LOW (ref 8.9–10.3)
Chloride: 98 mmol/L — ABNORMAL LOW (ref 101–111)
Creatinine, Ser: 0.67 mg/dL (ref 0.44–1.00)
GFR calc Af Amer: >60 mL/min
GFR calc non Af Amer: >60 mL/min
Glucose, Bld: 97 mg/dL (ref 65–99)
Potassium: 4 mmol/L (ref 3.5–5.1)
Sodium: 139 mmol/L (ref 135–145)

## 2015-05-29 NOTE — Progress Notes (Signed)
Daily Progress Note   Patient Name: [name]       Date: 05/29/2015 DOB: [birth year]  Age: 82 y.o. MRN#: [id number] Attending Physician: Avon Gully, MD Primary Care Physician: Avon Gully, MD Admit Date: 05/22/2015  Reason for Consultation/Follow-up: Disposition, Establishing goals of care and Psychosocial/spiritual support  Subjective: [name] is lying quietly in bed, her daughter-in-law Bonita Quin is at bedside. [name] tells me that she is doing well.  She shares that she was able to eat her breakfast, she's not having any pain. She is unable to speak in full sentences, and has a weak voice.  She tells me that she is ready to go home, that she feels that she can be cared for as well at home. She is on high flow nasal cannula at this time. Case management shared earlier today they have offered two home health services, no choice at this time.  I share my worries about her care at home, and her likely need for more skilled care.   [name] tells me she is put her trust in the [facility] and that he has a plan for her.   Awaiting chest x-ray, call to radiology, they tell me they will perform service setting.  Length of Stay: 6  Current Medications: Scheduled Meds:  . amiodarone  200 mg Oral Daily  . amLODipine  5 mg Oral Daily  . Ferrous Fumarate  1 tablet Oral BID  . guaiFENesin  1,200 mg Oral BID  . levalbuterol  0.63 mg Nebulization BID  . lisinopril  40 mg Oral Daily  . pantoprazole  40 mg Oral BID AC  . piperacillin-tazobactam (ZOSYN)  IV  3.375 g Intravenous Q8H  . potassium chloride  20 mEq Oral BID  . pravastatin  40 mg Oral Daily  . vancomycin  1,250 mg Intravenous Q24H    Continuous Infusions: . dextrose 5 % and 0.9% NaCl 50 mL/hr at 05/27/15 0613    PRN Meds: ALPRAZolam,  bisacodyl, guaiFENesin-codeine, levalbuterol, ondansetron **OR** ondansetron (ZOFRAN) IV  Physical Exam  Constitutional: No distress.  Pulmonary/Chest:  Marked work of breathing noted, unable to complete full sentences. Coarse and rhonchus breath sounds.  Abdominal: Soft. She exhibits no distension. There is no guarding.  Neurological: She is alert.  Skin: Skin is warm and dry.  Nursing note and  vitals reviewed.           Vital Signs: BP 149/69 mmHg  Pulse 72  Temp(Src) 97.5 F (36.4 C) (Oral)  Resp 24  Ht  (1.6 m)  Wt 69.8 kg (153 lb 14.1 oz)  BMI 27.27 kg/m2  SpO2 89% SpO2: SpO2: (!) 89 % O2 Device: O2 Device: High Flow Nasal Cannula O2 Flow Rate: O2 Flow Rate (L/min): 15 L/min  Intake/output summary:  Intake/Output Summary (Last 24 hours) at 05/29/15 1207 Last data filed at 05/29/15 1126  Gross per 24 hour  Intake    118 ml  Output   4950 ml  Net  -4832 ml   LBM: Last BM Date: 05/23/15 Baseline Weight: Weight: 69.8 kg (153 lb 14.1 oz) Most recent weight: Weight: 69.8 kg (153 lb 14.1 oz)       Palliative Assessment/Data:    Flowsheet Rows        Most Recent Value   Intake Tab    Unit at Time of Referral  Med/Surg Unit   Palliative Care Primary Diagnosis  Cancer   Date Notified  05/23/15   Palliative Care Type  New Palliative care   Reason for referral  Advance Care Planning, Clarify Goals of Care   Date of Admission  05/22/15   Date first seen by Palliative Care  05/23/15   # of days Palliative referral response time  0 Day(s)   # of days IP prior to Palliative referral  1   Clinical Assessment    Palliative Performance Scale Score  40%   Pain Max last 24 hours  Not able to report   Pain Min Last 24 hours  Not able to report   Dyspnea Max Last 24 Hours  Not able to report   Dyspnea Min Last 24 hours  Not able to report   Psychosocial & Spiritual Assessment    Palliative Care Outcomes    Patient/Family meeting held?  Yes   Who was at the meeting?   patient, daughter Lance Bosch, DIL [facility]   Palliative Care Outcomes  Clarified goals of care, Provided advance care planning, Provided psychosocial or spiritual support   Palliative Care follow-up planned  -- [follow up at [facility]]      Patient Active Problem List   Diagnosis Date Noted  . Hospital acquired PNA 05/23/2015  . HCAP (healthcare-associated pneumonia) 05/23/2015  . Aspiration pneumonia of both lower lobes (HCC)   . Palliative care encounter   . DNR (do not resuscitate) discussion   . Acute GI bleeding   . Severe anemia 05/14/2015  . Melena 05/14/2015  . Weakness generalized 05/14/2015  . GI bleed 05/14/2015  . Atrial fibrillation (HCC) 04/26/2015  . Severe tricuspid regurgitation 03/15/2015  . CHF (congestive heart failure) (HCC) 03/08/2015  . Systolic murmur 03/08/2015  . CAD (coronary artery disease) 05/25/2014  . DYSLIPIDEMIA 03/14/2010  . Essential hypertension, benign 03/20/2009  . BRADYCARDIA 03/20/2009  . Cardiac pacemaker in situ 03/20/2009    Palliative Care Assessment & Plan   Patient Profile: [name] is a 82 year old female with limited medical history including hypertension, systolic murmur, cardiac pacemaker, a field, DJD who presented to the ED on 54 with intermittent cough and shortness of breath.  Assessment: as above  Recommendations/Plan:  [name] desire is to return to her own home with home health services  Goals of Care and Additional Recommendations:  Limitations on Scope of Treatment: Full Scope Treatment  Code Status:    Code Status  Orders        Start     Ordered   05/23/15 0405  Full code   Continuous     05/23/15 0404    Code Status History    Date Active Date Inactive Code Status Order ID Comments User Context   05/14/2015 11:59 AM 05/19/2015  2:18 PM Full Code [id number]  Henderson Cloud, MD Inpatient       Prognosis:   < 6 months, likely due to weakness and frailty.  Discharge Planning:  Home with  Home Health  Care plan was discussed with Nursing staff, case manager, social worker, and Dr. Felecia Shelling on next rounds.  Thank you for allowing the Palliative Medicine Team to assist in the care of this patient.   Time In: 1030 Time Out: 1105 Total Time 35 Minutes  Prolonged Time Billed  no       Greater than 50%  of this time was spent counseling and coordinating care related to the above assessment and plan.  Harshitha Fretz A, NP  Please contact Palliative Medicine Team phone at [phone] for questions and concerns.

## 2015-05-29 NOTE — Progress Notes (Signed)
Pharmacy Antibiotic Note  [name] is a 82 y.o. female admitted on 05/22/2015 with pneumonia.  Pharmacy has been consulted for Vancomycin and zosyn dosing.   Vancomycin tough below goal when checked last Friday  Plan: Vancomycin to 1250 mg IV every 24 hours. Goal trough 15-20 mcg/ml Vancomycin trough level weekly or sooner if warranted Continue Zosyn 3.375 gm IV every 8 hours (4 hr infusion) F/U cultures and deescalate as appropriately indicated  Height: 5\' 3"  (160 cm) Weight: 153 lb 14.1 oz (69.8 kg) IBW/kg (Calculated) : 52.4  Temp (24hrs), Avg:97.5 F (36.4 C), Min:97.5 F (36.4 C), Max:97.5 F (36.4 C)   Recent Labs Lab 05/23/15 0026 05/24/15 1334 05/26/15 0542 05/26/15 2100 05/27/15 0654 05/29/15 0419  WBC 7.6  --  6.1  --  4.8 5.5  CREATININE 0.97 0.81 0.76  --  0.65 0.67  VANCOTROUGH  --   --   --  8*  --   --     Estimated Creatinine Clearance: 41.2 mL/min (by C-G formula based on Cr of 0.67).    No Known Allergies  Antimicrobials this admission: Vancomycin 5/2 >>  Zosyn 5/2 >>   Thank you for allowing pharmacy to be a part of this patient's care.  Valrie Hart, PharmD Clinical Pharmacist Pager:  [phone] 05/29/2015 12:35 PM

## 2015-05-29 NOTE — Care Management Important Message (Signed)
Important Message  Patient Details  Name: [name] MRN: [id number] Date of Birth: 02/21/1920   Medicare Important Message Given:  Yes    Adonis Huguenin, RN 05/29/2015, 1:17 PM

## 2015-05-29 NOTE — Progress Notes (Signed)
Subjective: Patient continue to have difficulty to breath. She was given a dose of diuretics. Patient feels very weak and tired. Discussed her condition with her daughter. Patient still wants to be resuscitated. Palliative consult was done previously. Objective: Vital signs in last 24 hours: Temp:  [97.5 F (36.4 C)] 97.5 F (36.4 C) (05/08 0656) Pulse Rate:  [70-72] 72 (05/08 0656) Resp:  [24] 24 (05/08 0656) BP: (149-159)/(69-88) 149/69 mmHg (05/08 0656) SpO2:  [88 %-92 %] 92 % (05/08 0656) Weight change:  Last BM Date: 05/23/15  Intake/Output from previous day: 05/07 0701 - 05/08 0700 In: -  Out: 4100 [Urine:4100]  PHYSICAL EXAM General appearance: mild distress and slowed mentation Resp: diminished breath sounds bilaterally and rhonchi bilaterally Cardio: irregularly irregular rhythm GI: soft, non-tender; bowel sounds normal; no masses,  no organomegaly Extremities: extremities normal, atraumatic, no cyanosis or edema  Lab Results:  Results for orders placed or performed during the hospital encounter of 05/22/15 (from the past 48 hour(s))  Basic metabolic panel     Status: Abnormal   Collection Time: 05/29/15  4:19 AM  Result Value Ref Range   Sodium 139 135 - 145 mmol/L   Potassium 4.0 3.5 - 5.1 mmol/L   Chloride 98 (L) 101 - 111 mmol/L   CO2 35 (H) 22 - 32 mmol/L   Glucose, Bld 97 65 - 99 mg/dL   BUN 7 6 - 20 mg/dL   Creatinine, Ser 0.67 0.44 - 1.00 mg/dL   Calcium 8.8 (L) 8.9 - 10.3 mg/dL   GFR calc non Af Amer >60 >60 mL/min   GFR calc Af Amer >60 >60 mL/min    Comment: (NOTE) The eGFR has been calculated using the CKD EPI equation. This calculation has not been validated in all clinical situations. eGFR's persistently <60 mL/min signify possible Chronic Kidney Disease.    Anion gap 6 5 - 15  CBC     Status: Abnormal   Collection Time: 05/29/15  4:19 AM  Result Value Ref Range   WBC 5.5 4.0 - 10.5 K/uL   RBC 3.33 (L) 3.87 - 5.11 MIL/uL   Hemoglobin 10.3  (L) 12.0 - 15.0 g/dL   HCT 32.2 (L) 36.0 - 46.0 %   MCV 96.7 78.0 - 100.0 fL   MCH 30.9 26.0 - 34.0 pg   MCHC 32.0 30.0 - 36.0 g/dL   RDW 14.6 11.5 - 15.5 %   Platelets 260 150 - 400 K/uL    ABGS No results for input(s): PHART, PO2ART, TCO2, HCO3 in the last 72 hours.  Invalid input(s): PCO2 CULTURES No results found for this or any previous visit (from the past 240 hour(s)). Studies/Results: No results found.  Medications: I have reviewed the patient's current medications.  Assesment:   Principal Problem:   Hospital acquired PNA Active Problems:   Cardiac pacemaker in situ   Systolic murmur   Atrial fibrillation (HCC)   Weakness generalized   GI bleed   HCAP (healthcare-associated pneumonia)   Aspiration pneumonia of both lower lobes (HCC)   Palliative care encounter   DNR (do not resuscitate) discussion    Plan:  Medications reviewed Continue iv antibiotics Continue puree diet Will repeat chest x-ray.    LOS: 6 days   FANTA,TESFAYE 05/29/2015, 8:09 AM   

## 2015-05-29 NOTE — Progress Notes (Addendum)
Speech Language Pathology Treatment: Dysphagia  Patient Details Name: [name] MRN: [id number] DOB: 24-Jul-1920 Today's Date: 05/29/2015 Time: 1884-1660 SLP Time Calculation (min) (ACUTE ONLY): 24 min  Assessment / Plan / Recommendation Clinical Impression  SLP continued follow up this date to determine diet tolerance of dysphagia 1 (puree) and thin liquids. Note chest x ray this date significant for small bilateral pleural effusion; hazy bilateral basilar atelectasis or infiltrate with worsening from prior exam. Pt with continued respiratory compromise and on high flow nasal cannula at 15L. Skilled observation of thin liquids this date without overt signs or symtpoms of aspiration though trials limited given pt fatigue and decreased motivation for PO. Silent aspiration cannot be ruled out without objective testing. Note palliative consult this date. Recommend continue dysphagia 1 (puree) and thin liqiud diet. ST to follow up for goals of care.    HPI HPI: [name] is a 82 y.o. female with medical history significant of DJD, HTN, systolic murmur, cardiac pacemaker, and atrial fibrillation presented with daughter and brother with complaints of SOB secondary to cough that onset around 9am this morning. Per daughter bedside she was having breakfast this morning and was noted to have intermittent cough through out the day. Daughter denies any swallow studies. She does report recent recurrence of issues with eating and drinking. 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 Chest x-ray today showed: Small bilateral pleural effusions with basilar atelectasis or infiltration in both lung bases. Large esophageal hiatal hernia behind the heart.      SLP Plan  Continue with current plan of care     Recommendations  Diet recommendations: Dysphagia 1 (puree);Thin liquid Liquids provided via: Cup;Straw;Teaspoon Medication Administration:  Whole meds with liquid Supervision: Staff to assist with self feeding;Full supervision/cueing for compensatory strategies Compensations: Slow rate;Small sips/bites Postural Changes and/or Swallow Maneuvers: Seated upright 90 degrees;Upright 30-60 min after meal             Oral Care Recommendations: Oral care BID;Staff/trained caregiver to provide oral care Follow up Recommendations: 24 hour supervision/assistance Plan: Continue with current plan of care     GO              Marcene Duos MA, CCC-SLP Acute Care Speech Language Pathologist     Kennieth Rad 05/29/2015, 4:47 PM

## 2015-05-29 NOTE — Care Management Note (Signed)
Case Management Note  Patient Details  Name: [name] MRN: [id number] Date of Birth: 05/08/1920  Subjective/Objective: to room to speak with patient and famiy. Patient is on HFNC at 15 LPM. SOB Talks in words.  Contiune to follow for discharge planning. Patient is full code.                     Action/Plan:  Home with HH/Hospice   Expected Discharge Date:                  Expected Discharge Plan:  Home w Home Health Services  In-House Referral:     Discharge planning Services  CM Consult  Post Acute Care Choice:  Durable Medical Equipment Choice offered to:  Adult Children  DME Arranged:  Oxygen, Hospital bed DME Agency:  [facility]  HH Arranged:  RN, PT, Nurse's Aide, Speech Therapy, Social Work [facility] Agency:     Status of Service:  In process, will continue to follow  Medicare Important Message Given:  Yes Date Medicare IM Given:    Medicare IM give by:    Date Additional Medicare IM Given:    Additional Medicare Important Message give by:     If discussed at Long Length of Stay Meetings, dates discussed:    Additional Comments:  Adonis Huguenin, RN 05/29/2015, 3:33 PM

## 2015-05-30 MED ORDER — FUROSEMIDE 40 MG PO TABS
40.0000 mg | ORAL_TABLET | Freq: Every day | ORAL | Status: DC
  Administered 2015-05-30 – 2015-06-03 (×5): 40 mg via ORAL
  Filled 2015-05-30 (×5): qty 1

## 2015-05-30 NOTE — Progress Notes (Signed)
Daily Progress Note   Patient Name: [name]       Date: 05/30/2015 DOB: Aug 03, 1920  Age: 82 y.o. MRN#: [id number] Attending Physician: Avon Gully, MD Primary Care Physician: Avon Gully, MD Admit Date: 05/22/2015  Reason for Consultation/Follow-up: Establishing goals of care and Psychosocial/spiritual support  Subjective: [name] is lying quietly in bed, she does not open her eyes as I enter.  Her son [name] is at bedside.  We talk about doctors Felecia Shelling and [place] visiting earlier this morning. We talk briefly about her worsening chest x-ray. We talk about her oxygen levels and sterling mentions that she is a mouth breather (she has hi flow nasal cannula). He also states that he realizes she's on 15 liters here in the highest available in home is 10 L.  We talk about [name] ability to make decisions, and I share that I anticipate she will be unable to make decisions soon. [name] shares that his sisters [name] and [name] will be making decisions for the family.  We continue to support this family through this difficult time.  Length of Stay: 7  Current Medications: Scheduled Meds:  . amiodarone  200 mg Oral Daily  . amLODipine  5 mg Oral Daily  . Ferrous Fumarate  1 tablet Oral BID  . furosemide  40 mg Oral Daily  . guaiFENesin  1,200 mg Oral BID  . levalbuterol  0.63 mg Nebulization BID  . lisinopril  40 mg Oral Daily  . pantoprazole  40 mg Oral BID AC  . piperacillin-tazobactam (ZOSYN)  IV  3.375 g Intravenous Q8H  . potassium chloride  20 mEq Oral BID  . pravastatin  40 mg Oral Daily    Continuous Infusions:    PRN Meds: ALPRAZolam, bisacodyl, guaiFENesin-codeine, levalbuterol, ondansetron **OR** ondansetron (ZOFRAN) IV  Physical Exam  Constitutional: No distress.   HENT:  Head: Normocephalic and atraumatic.  Pulmonary/Chest:  Coarse breath sounds  Neurological:  [name] is asleep, she does not wake as I talk with her son in the room.  Skin: Skin is warm and dry.  Nursing note and vitals reviewed.           Vital Signs: BP 144/57 mmHg  Pulse 76  Temp(Src) 97.8 F (36.6 C) (Oral)  Resp 20  Ht 5\' 3"  (1.6 m)  Wt  69.8 kg (153 lb 14.1 oz)  BMI 27.27 kg/m2  SpO2 90% SpO2: SpO2: 90 % O2 Device: O2 Device: High Flow Nasal Cannula O2 Flow Rate: O2 Flow Rate (L/min): 15 L/min  Intake/output summary:  Intake/Output Summary (Last 24 hours) at 05/30/15 1423 Last data filed at 05/30/15 1400  Gross per 24 hour  Intake    300 ml  Output    950 ml  Net   -650 ml   LBM: Last BM Date: 05/30/15 Baseline Weight: Weight: 69.8 kg (153 lb 14.1 oz) Most recent weight: Weight: 69.8 kg (153 lb 14.1 oz)       Palliative Assessment/Data:    Flowsheet Rows        Most Recent Value   Intake Tab    Unit at Time of Referral  Med/Surg Unit   Palliative Care Primary Diagnosis  Cancer   Date Notified  05/23/15   Palliative Care Type  New Palliative care   Reason for referral  Advance Care Planning, Clarify Goals of Care   Date of Admission  05/22/15   Date first seen by Palliative Care  05/23/15   # of days Palliative referral response time  0 Day(s)   # of days IP prior to Palliative referral  1   Clinical Assessment    Palliative Performance Scale Score  40%   Pain Max last 24 hours  Not able to report   Pain Min Last 24 hours  Not able to report   Dyspnea Max Last 24 Hours  Not able to report   Dyspnea Min Last 24 hours  Not able to report   Psychosocial & Spiritual Assessment    Palliative Care Outcomes    Patient/Family meeting held?  Yes   Who was at the meeting?  patient, daughter Lance Bosch, DIL [facility]   Palliative Care Outcomes  Clarified goals of care, Provided advance care planning, Provided psychosocial or spiritual support    Palliative Care follow-up planned  -- [follow up at [facility]]      Patient Active Problem List   Diagnosis Date Noted  . Hospital acquired PNA 05/23/2015  . HCAP (healthcare-associated pneumonia) 05/23/2015  . Aspiration pneumonia of both lower lobes (HCC)   . Palliative care encounter   . DNR (do not resuscitate) discussion   . Acute GI bleeding   . Severe anemia 05/14/2015  . Melena 05/14/2015  . Weakness generalized 05/14/2015  . GI bleed 05/14/2015  . Atrial fibrillation (HCC) 04/26/2015  . Severe tricuspid regurgitation 03/15/2015  . CHF (congestive heart failure) (HCC) 03/08/2015  . Systolic murmur 03/08/2015  . CAD (coronary artery disease) 05/25/2014  . DYSLIPIDEMIA 03/14/2010  . Essential hypertension, benign 03/20/2009  . BRADYCARDIA 03/20/2009  . Cardiac pacemaker in situ 03/20/2009    Palliative Care Assessment & Plan   Patient Profile: [name] is a 82 year old female patient with a history of hypertension, systolic murmur, cardiac pacemaker, and a fib. She had complaints of shortness of breath and a cough and was brought to the ED for evaluation. She is found to be aspirating and have worsening chest x-ray as of yesterday.  Assessment: As above  Recommendations/Plan:  Continue to treat the treatable, supporting [name] and her family  Goals of Care and Additional Recommendations:  Limitations on Scope of Treatment: continue to treat the treatable, full aggressive care at this time.  Code Status:    Code Status Orders        Start     Ordered  05/23/15 0405  Full code   Continuous     05/23/15 0404    Code Status History    Date Active Date Inactive Code Status Order ID Comments User Context   05/14/2015 11:59 AM 05/19/2015  2:18 PM Full Code [id number]  Henderson Cloud, MD Inpatient       Prognosis:   < 4 weeks, at this time due to worsening chest x-ray, worsening breathing, decreased PO intake/nutrition.  Discharge  Planning:  family states the goal is to return [name] to her own home, unsure if twe will be able to accomplish this without the help of hospice. Possible Hospital death.  Care plan was discussed with nursing staff, CM, SW, and Dr. Juanetta Gosling.  Thank you for allowing the Palliative Medicine Team to assist in the care of this patient.   Time In: 1120 Time Out: 1140 Total Time 20 minutes Prolonged Time Billed  no       Greater than 50%  of this time was spent counseling and coordinating care related to the above assessment and plan.  Crue Otero A, NP  Please contact Palliative Medicine Team phone at [phone] for questions and concerns.

## 2015-05-30 NOTE — Clinical Documentation Improvement (Signed)
Pulmonology  Can the diagnosis of CHF be further specified?    Acuity - Acute, Chronic, Acute on Chronic   Type - Systolic, Diastolic, Systolic and Diastolic  Other  Clinically Undetermined   Document any associated diagnoses/conditions   Supporting Information: Stated in consult note  - Assesment: She has healthcare associated pneumonia and acute on chronic rest rotatory failure. She's been aspirating. She has chronic atrial fibrillation which I think is stable. She has a history of diastolic heart failure and I think that's part of the problem now.     Please exercise your independent, professional judgment when responding. A specific answer is not anticipated or expected.   Thank Modesta Messing [facility] Health Information Management [place] [phone]

## 2015-05-30 NOTE — Consult Note (Signed)
   [facility] CM Inpatient Consult   05/30/2015  [name] 05/24/1920 [id number]  Patient screened for potential [facility] Care Management services. Patient is eligible for [facility] Care [facility] Services. However, electronic medical record reveals patient's discharge plan is home with Hospice care, there were no identifiable [facility] care management needs at this time. [facility] Care Management services not appropriate at this time.  Of note, [facility] program services would not interfere with or replace any discharge services put in place by inpatient case manager or social worker.  If patient's post hospital needs change please place a [facility] Care Management consult.  For questions please contact:    Alben Spittle. Albertha Ghee, RN, BSN, [facility] [phone]) Business Cell  [phone]) Toll Free Office

## 2015-05-30 NOTE — Progress Notes (Signed)
Subjective: Patient feels slightly better. However, she is still short of breath and very weak. No chest pain. Her chest x-ray showed worsening infiltrate/atelectasis and vascular congestion. Objective: Vital signs in last 24 hours: Temp:  [97.5 F (36.4 C)-97.8 F (36.6 C)] 97.5 F (36.4 C) (05/09 0630) Pulse Rate:  [68-73] 68 (05/09 0630) Resp:  [20] 20 (05/09 0630) BP: (144-165)/(61-68) 147/61 mmHg (05/09 0630) SpO2:  [89 %-94 %] 89 % (05/09 0736) Weight change:  Last BM Date: 05/23/15  Intake/Output from previous day: 05/08 0701 - 05/09 0700 In: 536 [P.O.:236; IV Piggyback:300] Out: 1250 [Urine:1250]  PHYSICAL EXAM General appearance: mild distress and slowed mentation Resp: diminished breath sounds bilaterally and rhonchi bilaterally Cardio: irregularly irregular rhythm GI: soft, non-tender; bowel sounds normal; no masses,  no organomegaly Extremities: extremities normal, atraumatic, no cyanosis or edema  Lab Results:  Results for orders placed or performed during the hospital encounter of 05/22/15 (from the past 48 hour(s))  Basic metabolic panel     Status: Abnormal   Collection Time: 05/29/15  4:19 AM  Result Value Ref Range   Sodium 139 135 - 145 mmol/L   Potassium 4.0 3.5 - 5.1 mmol/L   Chloride 98 (L) 101 - 111 mmol/L   CO2 35 (H) 22 - 32 mmol/L   Glucose, Bld 97 65 - 99 mg/dL   BUN 7 6 - 20 mg/dL   Creatinine, Ser 0.67 0.44 - 1.00 mg/dL   Calcium 8.8 (L) 8.9 - 10.3 mg/dL   GFR calc non Af Amer >60 >60 mL/min   GFR calc Af Amer >60 >60 mL/min    Comment: (NOTE) The eGFR has been calculated using the CKD EPI equation. This calculation has not been validated in all clinical situations. eGFR's persistently <60 mL/min signify possible Chronic Kidney Disease.    Anion gap 6 5 - 15  CBC     Status: Abnormal   Collection Time: 05/29/15  4:19 AM  Result Value Ref Range   WBC 5.5 4.0 - 10.5 K/uL   RBC 3.33 (L) 3.87 - 5.11 MIL/uL   Hemoglobin 10.3 (L) 12.0 -  15.0 g/dL   HCT 32.2 (L) 36.0 - 46.0 %   MCV 96.7 78.0 - 100.0 fL   MCH 30.9 26.0 - 34.0 pg   MCHC 32.0 30.0 - 36.0 g/dL   RDW 14.6 11.5 - 15.5 %   Platelets 260 150 - 400 K/uL    ABGS No results for input(s): PHART, PO2ART, TCO2, HCO3 in the last 72 hours.  Invalid input(s): PCO2 CULTURES No results found for this or any previous visit (from the past 240 hour(s)). Studies/Results: Dg Chest 1 View  05/29/2015  CLINICAL DATA:  Pneumonia EXAM: CHEST 1 VIEW COMPARISON:  05/23/2015 FINDINGS: Cardiomegaly again noted. Large hiatal hernia again noted. Small bilateral pleural effusion with bilateral basilar hazy atelectasis or infiltrate. Slight worsening from prior exam. Dual lead cardiac pacemaker is unchanged in position. Central vascular congestion without convincing pulmonary edema IMPRESSION: Cardiomegaly. Large hiatal hernia again noted. Small bilateral pleural effusion. Hazy bilateral basilar atelectasis or infiltrate with worsening from prior exam. Central vascular congestion without convincing pulmonary edema. Electronically Signed   By: Lahoma Crocker M.D.   On: 05/29/2015 12:56    Medications: I have reviewed the patient's current medications.  Assesment:   Principal Problem:   Hospital acquired PNA Active Problems:   Cardiac pacemaker in situ   Systolic murmur   Atrial fibrillation (HCC)   Weakness generalized   GI bleed  HCAP (healthcare-associated pneumonia)   Aspiration pneumonia of both lower lobes [facility])   Palliative care encounter   DNR (do not resuscitate) discussion    Plan:  Medications reviewed Continue iv antibiotics Will D/C IV fluid Start on lasix Discussed  With Dr. Luan Pulling about further management    LOS: 7 days   Verlan Grotz 05/30/2015, 8:06 AM

## 2015-05-30 NOTE — Progress Notes (Signed)
Speech Language Pathology Treatment: Dysphagia  Patient Details Name: [name] MRN: [id number] DOB: 1920/03/11 Today's Date: 05/30/2015 Time: 7793-9030 SLP Time Calculation (min) (ACUTE ONLY): 26 min  Assessment / Plan / Recommendation Clinical Impression  Pt again seen at bedside with family present. Pt visibly short of breath and continues on 15L via nasal cannula, although breathing through mouth. Pt's eyes are glassy and noticeable fluid in lower lids. Pt continues to be conversant and with clear vocal quality when spoken to. SLP discussed concerns regarding respiratory effort and risks for aspiration. Pt assessed with ice chips, thin water, and nectar-thick liquids and appears to tolerate without difficulty (she doesn't want very much), although short of breath. SLP discussed trial of nectar-thick liquids to see if better tolerated or short term feeding tube (small bore) given lack of sustainable nutrition over the past week. Pt has large hiatal hernia per x-rays and feeding tube may not be ideal. It was suggested that they discuss with MD. [name] continues to be pleasant and lucid, stating "we have to try" when alternatives discussed. Family states that pt's youngest son will be coming here tomorrow from [place] and she has another child in [place]. Downgrading to nectar-thick liquids was not done due to increased difficulty in clearing should she aspirate. Pt too medically frail to take for MBSS. Consider options of temporary alternative nutrition (small bore feeding tube) vs comfort care with pleasure feeds of puree and thin if/when pt desires. Continue oral care. SLP will follow per goals of care.   HPI HPI: [name] is a 82 y.o. female with medical history significant of DJD, HTN, systolic murmur, cardiac pacemaker, and atrial fibrillation presented with daughter and brother with complaints of SOB secondary to cough that onset around 9am this morning. Per daughter bedside she was having  breakfast this morning and was noted to have intermittent cough through out the day. Daughter denies any swallow studies. She does report recent recurrence of issues with eating and drinking. 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 Chest x-ray today showed: Small bilateral pleural effusions with basilar atelectasis or infiltration in both lung bases. Large esophageal hiatal hernia behind the heart.      SLP Plan  Continue with current plan of care     Recommendations  Diet recommendations: Dysphagia 1 (puree);Thin liquid Liquids provided via: Cup;Straw;Teaspoon Medication Administration: Whole meds with liquid Supervision: Staff to assist with self feeding;Full supervision/cueing for compensatory strategies Compensations: Slow rate;Small sips/bites Postural Changes and/or Swallow Maneuvers: Seated upright 90 degrees;Upright 30-60 min after meal             Oral Care Recommendations: Oral care BID;Staff/trained caregiver to provide oral care Follow up Recommendations: 24 hour supervision/assistance Plan: Continue with current plan of care     GO                Jaydenn Boccio 05/30/2015, 5:40 PM

## 2015-05-30 NOTE — Consult Note (Signed)
Consult requested by: Dr. Felecia Shelling Consult requested for respiratory failure:  HPI: This is a patient that I had seen over the weekend for Dr. Felecia Shelling for primary care and he has requested pulmonary consultation now because of her chronic respiratory failure. She has chronic aspiration and appears to have some problem with heart failure now. Her chest x-ray looks worse  Past Medical History  Diagnosis Date  . DJD (degenerative joint disease)   . Osteoporosis   . Anxiety   . Sleep apnea   . Seasonal allergies   . Anemia   . Hypertension   . Atrial fibrillation (HCC)      Family History  Problem Relation Age of Onset  . Colon cancer Father   . Colon cancer Paternal Aunt   . Crohn's disease Son      Social History   Social History  . Marital Status: Widowed    Spouse Name: N/A  . Number of Children: N/A  . Years of Education: N/A   Social History Main Topics  . Smoking status: Never Smoker   . Smokeless tobacco: None  . Alcohol Use: No  . Drug Use: No  . Sexual Activity: Not Asked   Other Topics Concern  . None   Social History Narrative   Widowed   Daughter     ROS: She's not had hemoptysis. She is coughing. No chest pain. She's had a little swelling. No abdominal pain nausea or vomiting. Otherwise per the history and physical    Objective: Vital signs in last 24 hours: Temp:  [97.5 F (36.4 C)-97.8 F (36.6 C)] 97.5 F (36.4 C) (05/09 0630) Pulse Rate:  [68-79] 79 (05/09 0905) Resp:  [20] 20 (05/09 0630) BP: (144-165)/(61-70) 145/70 mmHg (05/09 0905) SpO2:  [89 %-94 %] 89 % (05/09 0736) Weight change:  Last BM Date: 05/23/15  Intake/Output from previous day: 05/08 0701 - 05/09 0700 In: 536 [P.O.:236; IV Piggyback:300] Out: 1250 [Urine:1250]  PHYSICAL EXAM She is weak in appearance. She has rhonchi that I can hear without the stethoscope. She is on oxygen. Her pupils are reactive. Nose and throat are clear her neck is supple without masses. Her chest  shows rhonchi bilaterally. Heart somewhat irregular without a gallop. Her abdomen is soft no masses are felt she has trace to 1+ edema. Central nervous system exam mostly intact  Lab Results: Basic Metabolic Panel:  Recent Labs  16/10/96 0419  NA 139  K 4.0  CL 98*  CO2 35*  GLUCOSE 97  BUN 7  CREATININE 0.67  CALCIUM 8.8*   Liver Function Tests: No results for input(s): AST, ALT, ALKPHOS, BILITOT, PROT, ALBUMIN in the last 72 hours. No results for input(s): LIPASE, AMYLASE in the last 72 hours. No results for input(s): AMMONIA in the last 72 hours. CBC:  Recent Labs  05/29/15 0419  WBC 5.5  HGB 10.3*  HCT 32.2*  MCV 96.7  PLT 260   Cardiac Enzymes: No results for input(s): CKTOTAL, CKMB, CKMBINDEX, TROPONINI in the last 72 hours. BNP: No results for input(s): PROBNP in the last 72 hours. D-Dimer: No results for input(s): DDIMER in the last 72 hours. CBG: No results for input(s): GLUCAP in the last 72 hours. Hemoglobin A1C: No results for input(s): HGBA1C in the last 72 hours. Fasting Lipid Panel: No results for input(s): CHOL, HDL, LDLCALC, TRIG, CHOLHDL, LDLDIRECT in the last 72 hours. Thyroid Function Tests: No results for input(s): TSH, T4TOTAL, FREET4, T3FREE, THYROIDAB in the last 72 hours. Anemia  Panel: No results for input(s): VITAMINB12, FOLATE, FERRITIN, TIBC, IRON, RETICCTPCT in the last 72 hours. Coagulation: No results for input(s): LABPROT, INR in the last 72 hours. Urine Drug Screen: Drugs of Abuse  No results found for: LABOPIA, COCAINSCRNUR, LABBENZ, AMPHETMU, THCU, LABBARB  Alcohol Level: No results for input(s): ETH in the last 72 hours. Urinalysis: No results for input(s): COLORURINE, LABSPEC, PHURINE, GLUCOSEU, HGBUR, BILIRUBINUR, KETONESUR, PROTEINUR, UROBILINOGEN, NITRITE, LEUKOCYTESUR in the last 72 hours.  Invalid input(s): APPERANCEUR Misc. Labs:   ABGS: No results for input(s): PHART, PO2ART, TCO2, HCO3 in the last 72  hours.  Invalid input(s): PCO2   MICROBIOLOGY: No results found for this or any previous visit (from the past 240 hour(s)).  Studies/Results: Dg Chest 1 View  05/29/2015  CLINICAL DATA:  Pneumonia EXAM: CHEST 1 VIEW COMPARISON:  05/23/2015 FINDINGS: Cardiomegaly again noted. Large hiatal hernia again noted. Small bilateral pleural effusion with bilateral basilar hazy atelectasis or infiltrate. Slight worsening from prior exam. Dual lead cardiac pacemaker is unchanged in position. Central vascular congestion without convincing pulmonary edema IMPRESSION: Cardiomegaly. Large hiatal hernia again noted. Small bilateral pleural effusion. Hazy bilateral basilar atelectasis or infiltrate with worsening from prior exam. Central vascular congestion without convincing pulmonary edema. Electronically Signed   By: Natasha Mead M.D.   On: 05/29/2015 12:56    Medications:  Prior to Admission:  Prescriptions prior to admission  Medication Sig Dispense Refill Last Dose  . acetaminophen (TYLENOL) 650 MG CR tablet Take 650 mg by mouth every 8 (eight) hours as needed for pain.    Past Week at Unknown time  . amiodarone (PACERONE) 200 MG tablet Take 1 tablet (200 mg total) by mouth daily. 90 tablet 3 05/22/2015 at Unknown time  . amLODipine (NORVASC) 5 MG tablet Take 1 tablet (5 mg total) by mouth daily. 90 tablet 3 05/22/2015 at Unknown time  . calcium-vitamin D (OSCAL WITH D) 500-200 MG-UNIT per tablet Take 1 tablet by mouth daily.     05/22/2015 at Unknown time  . ferrous sulfate 325 (65 FE) MG EC tablet Take 65 mg by mouth 3 (three) times daily with meals.   05/22/2015 at Unknown time  . furosemide (LASIX) 40 MG tablet Take 1 tablet (40 mg total) by mouth daily. (Patient taking differently: Take 20 mg by mouth daily. Decreased to 20 mg daily. May take an extra 20 mg if leg swelling/weight gain occurs.) 90 tablet 3 Past Week at Unknown time  . lisinopril (PRINIVIL,ZESTRIL) 40 MG tablet Take 1 tablet (40 mg total) by  mouth daily. 90 tablet 0 05/22/2015 at Unknown time  . pantoprazole (PROTONIX) 40 MG tablet Take 1 tablet (40 mg total) by mouth 2 (two) times daily before a meal. 60 tablet 3 05/22/2015 at Unknown time  . potassium chloride SA (K-DUR,KLOR-CON) 20 MEQ tablet Take 1 tablet (20 mEq total) by mouth daily. 90 tablet 3 05/22/2015 at Unknown time  . pravastatin (PRAVACHOL) 40 MG tablet Take 40 mg by mouth daily.   05/22/2015 at Unknown time   Scheduled: . amiodarone  200 mg Oral Daily  . amLODipine  5 mg Oral Daily  . Ferrous Fumarate  1 tablet Oral BID  . furosemide  40 mg Oral Daily  . guaiFENesin  1,200 mg Oral BID  . levalbuterol  0.63 mg Nebulization BID  . lisinopril  40 mg Oral Daily  . pantoprazole  40 mg Oral BID AC  . piperacillin-tazobactam (ZOSYN)  IV  3.375 g Intravenous Q8H  .  potassium chloride  20 mEq Oral BID  . pravastatin  40 mg Oral Daily  . vancomycin  1,250 mg Intravenous Q24H   Continuous:  GUR:KYHCWCBJSE, bisacodyl, guaiFENesin-codeine, levalbuterol, ondansetron **OR** ondansetron (ZOFRAN) IV  Assesment: She has healthcare associated pneumonia and acute on chronic rest rotatory failure. She's been aspirating. She has chronic atrial fibrillation which I think is stable. She has a history of diastolic heart failure and I think that's part of the problem now. Principal Problem:   Hospital acquired PNA Active Problems:   Cardiac pacemaker in situ   Systolic murmur   Atrial fibrillation (HCC)   Weakness generalized   GI bleed   HCAP (healthcare-associated pneumonia)   Aspiration pneumonia of both lower lobes [facility])   Palliative care encounter   DNR (do not resuscitate) discussion    Plan: Add Lasix. Discussed with Dr. Felecia Shelling. Continue other treatments.    LOS: 7 days   Sourish Allender L 05/30/2015, 9:12 AM

## 2015-05-30 NOTE — Care Management Note (Signed)
Case Management Note  Patient Details  Name: [name] MRN: [id number] Date of Birth: [birth year]  Subjective/Objective:  Spoke with the daughter [name] and [name] at the bedside to discuss discharge plan again.  We discussed area providers and patient needs.  Patient is a full code at this time and receiving ABX treatments for Aspiration pneumonia.  Again choice was offered of [facility] providers that do Hospice.  Choice was narrowed to [place] and [facility]. Daughter aske me which one I preferred. I stated that I could not recommend one over the other. She decided that she would like to go with [facility]. I spoke with Erick Colace at [facility] to confirm that they could take patient with full code status and he said that he could as long as the patient was receiving no curative treatments.  He agreed to meet with the family this afternoon. Plan thus far is for home with Hospice but relayed to [name] this may change. Will discuss further with Palliative care. Discussed case with Dr Felecia Shelling.                  Action/Plan: Home with Hospice.   Expected Discharge Date:                  Expected Discharge Plan:  Home w Hospice Care  In-House Referral:     Discharge planning Services  CM Consult  Post Acute Care Choice:  Durable Medical Equipment Choice offered to:  Adult Children  DME Arranged:  Oxygen, Hospital bed DME Agency:  [facility]  HH Arranged:  RN, PT, Nurse's Aide, Speech Therapy, Social Work [facility] Agency:     Status of Service:  In process, will continue to follow  Medicare Important Message Given:  Yes Date Medicare IM Given:    Medicare IM give by:    Date Additional Medicare IM Given:    Additional Medicare Important Message give by:     If discussed at Long Length of Stay Meetings, dates discussed:    Additional Comments:  Adonis Huguenin, RN 05/30/2015, 8:29 AM

## 2015-05-30 NOTE — Clinical Documentation Improvement (Signed)
Pulmonology  Can the diagnosis of Respiratory Failure be further specified?   Document Acuity - Acute, Chronic, Acute on Chronic  Document Inclusion Of - Hypoxia, Hypercapnia, Combination of Both  Other  Clinically Undetermined  Document any associated diagnoses/conditions.   Supporting Information: Consult requested for chronic resp failure.  On high flow oxygen; actelectasis on CXR; Resp: diminished breath sounds bilaterally and rhonchi bilaterally;She is unable to speak in full sentences, and has a weak voice.    Please exercise your independent, professional judgment when responding. A specific answer is not anticipated or expected.   Thank Modesta Messing [facility] Information Management [place] [phone]

## 2015-05-31 LAB — BASIC METABOLIC PANEL
Anion gap: 7 (ref 5–15)
BUN: 8 mg/dL (ref 6–20)
CALCIUM: 9 mg/dL (ref 8.9–10.3)
CHLORIDE: 96 mmol/L — AB (ref 101–111)
CO2: 32 mmol/L (ref 22–32)
CREATININE: 0.64 mg/dL (ref 0.44–1.00)
GFR calc Af Amer: >60 mL/min (ref >60)
GFR calc non Af Amer: >60 mL/min (ref >60)
GLUCOSE: 112 mg/dL — AB (ref 65–99)
Potassium: 4.4 mmol/L (ref 3.5–5.1)
Sodium: 135 mmol/L (ref 135–145)

## 2015-05-31 NOTE — Progress Notes (Signed)
PT Cancellation Note  Patient Details Name: TALYN MESNARD MRN: [id number] DOB: [birth year]   Cancelled Treatment:    Reason Eval/Treat Not Completed: Patient's level of consciousness Attempted to see pt x 2 today, however pt soundly sleeping both times, and family requests to allow her to rest.  Informed family that PT would check back tomorrow, and goal would be to work on bed level exercises only due to pt's respiratory status and need for high level of supplementary O2 - 15L/min high flow at this time.     Beth Kean Gautreau, PT, DPT X: [phone]   05/31/2015, 1:57 PM

## 2015-05-31 NOTE — Progress Notes (Signed)
Pharmacy Antibiotic Note  [name] is a 82 y.o. female admitted on 05/22/2015 with pneumonia.  Pharmacy has been consulted for zosyn dosing.  Renal fxn stable.    Plan: Continue Zosyn 3.375 gm IV every 8 hours (4 hr infusion) F/U cultures and deescalate as appropriately indicated  Height: 5\' 3"  (160 cm) Weight: 153 lb 14.1 oz (69.8 kg) IBW/kg (Calculated) : 52.4  Temp (24hrs), Avg:98.3 F (36.8 C), Min:97.8 F (36.6 C), Max:98.7 F (37.1 C)   Recent Labs Lab 05/24/15 1334 05/26/15 0542 05/26/15 2100 05/27/15 0654 05/29/15 0419 05/31/15 0534  WBC  --  6.1  --  4.8 5.5  --   CREATININE 0.81 0.76  --  0.65 0.67 0.64  VANCOTROUGH  --   --  8*  --   --   --     Estimated Creatinine Clearance: 41.2 mL/min (by C-G formula based on Cr of 0.64).    No Known Allergies  Antimicrobials this admission: Vancomycin 5/2 >> 5/9 Zosyn 5/2 >>   Thank you for allowing pharmacy to be a part of this patient's care.  Valrie Hart, PharmD Clinical Pharmacist Pager:  [phone] 05/31/2015 8:04 AM

## 2015-05-31 NOTE — Progress Notes (Signed)
Subjective: She did better last night. She is still requiring 15 L of oxygen.  Objective: Vital signs in last 24 hours: Temp:  [97.8 F (36.6 C)-98.7 F (37.1 C)] 98.7 F (37.1 C) (05/10 0652) Pulse Rate:  [66-76] 66 (05/10 0652) Resp:  [20] 20 (05/10 0652) BP: (144-158)/(57-61) 158/61 mmHg (05/10 0652) SpO2:  [90 %-99 %] 99 % (05/10 6433) Weight change:  Last BM Date: 05/30/15  Intake/Output from previous day: 05/09 0701 - 05/10 0700 In: 0  Out: 950 [Urine:950]  PHYSICAL EXAM General appearance: Chronically sick short of breath and slow to respond Resp: rhonchi bilaterally Cardio: irregularly irregular rhythm GI: soft, non-tender; bowel sounds normal; no masses,  no organomegaly Extremities: extremities normal, atraumatic, no cyanosis or edema  Lab Results:  Results for orders placed or performed during the hospital encounter of 05/22/15 (from the past 48 hour(s))  Basic metabolic panel     Status: Abnormal   Collection Time: 05/31/15  5:34 AM  Result Value Ref Range   Sodium 135 135 - 145 mmol/L   Potassium 4.4 3.5 - 5.1 mmol/L   Chloride 96 (L) 101 - 111 mmol/L   CO2 32 22 - 32 mmol/L   Glucose, Bld 112 (H) 65 - 99 mg/dL   BUN 8 6 - 20 mg/dL   Creatinine, Ser 0.64 0.44 - 1.00 mg/dL   Calcium 9.0 8.9 - 10.3 mg/dL   GFR calc non Af Amer >60 >60 mL/min   GFR calc Af Amer >60 >60 mL/min    Comment: (NOTE) The eGFR has been calculated using the CKD EPI equation. This calculation has not been validated in all clinical situations. eGFR's persistently <60 mL/min signify possible Chronic Kidney Disease.    Anion gap 7 5 - 15    ABGS No results for input(s): PHART, PO2ART, TCO2, HCO3 in the last 72 hours.  Invalid input(s): PCO2 CULTURES No results found for this or any previous visit (from the past 240 hour(s)). Studies/Results: Dg Chest 1 View  05/29/2015  CLINICAL DATA:  Pneumonia EXAM: CHEST 1 VIEW COMPARISON:  05/23/2015 FINDINGS: Cardiomegaly again noted.  Large hiatal hernia again noted. Small bilateral pleural effusion with bilateral basilar hazy atelectasis or infiltrate. Slight worsening from prior exam. Dual lead cardiac pacemaker is unchanged in position. Central vascular congestion without convincing pulmonary edema IMPRESSION: Cardiomegaly. Large hiatal hernia again noted. Small bilateral pleural effusion. Hazy bilateral basilar atelectasis or infiltrate with worsening from prior exam. Central vascular congestion without convincing pulmonary edema. Electronically Signed   By: Lahoma Crocker M.D.   On: 05/29/2015 12:56    Medications:  Prior to Admission:  Prescriptions prior to admission  Medication Sig Dispense Refill Last Dose  . acetaminophen (TYLENOL) 650 MG CR tablet Take 650 mg by mouth every 8 (eight) hours as needed for pain.    Past Week at Unknown time  . amiodarone (PACERONE) 200 MG tablet Take 1 tablet (200 mg total) by mouth daily. 90 tablet 3 05/22/2015 at Unknown time  . amLODipine (NORVASC) 5 MG tablet Take 1 tablet (5 mg total) by mouth daily. 90 tablet 3 05/22/2015 at Unknown time  . calcium-vitamin D (OSCAL WITH D) 500-200 MG-UNIT per tablet Take 1 tablet by mouth daily.     05/22/2015 at Unknown time  . ferrous sulfate 325 (65 FE) MG EC tablet Take 65 mg by mouth 3 (three) times daily with meals.   05/22/2015 at Unknown time  . furosemide (LASIX) 40 MG tablet Take 1 tablet (40 mg  total) by mouth daily. (Patient taking differently: Take 20 mg by mouth daily. Decreased to 20 mg daily. May take an extra 20 mg if leg swelling/weight gain occurs.) 90 tablet 3 Past Week at Unknown time  . lisinopril (PRINIVIL,ZESTRIL) 40 MG tablet Take 1 tablet (40 mg total) by mouth daily. 90 tablet 0 05/22/2015 at Unknown time  . pantoprazole (PROTONIX) 40 MG tablet Take 1 tablet (40 mg total) by mouth 2 (two) times daily before a meal. 60 tablet 3 05/22/2015 at Unknown time  . potassium chloride SA (K-DUR,KLOR-CON) 20 MEQ tablet Take 1 tablet (20 mEq total) by  mouth daily. 90 tablet 3 05/22/2015 at Unknown time  . pravastatin (PRAVACHOL) 40 MG tablet Take 40 mg by mouth daily.   05/22/2015 at Unknown time   Scheduled: . amiodarone  200 mg Oral Daily  . amLODipine  5 mg Oral Daily  . Ferrous Fumarate  1 tablet Oral BID  . furosemide  40 mg Oral Daily  . guaiFENesin  1,200 mg Oral BID  . levalbuterol  0.63 mg Nebulization BID  . lisinopril  40 mg Oral Daily  . pantoprazole  40 mg Oral BID AC  . piperacillin-tazobactam (ZOSYN)  IV  3.375 g Intravenous Q8H  . potassium chloride  20 mEq Oral BID  . pravastatin  40 mg Oral Daily   Continuous:  QTM:[id number], bisacodyl, guaiFENesin-codeine, levalbuterol, ondansetron **OR** ondansetron (ZOFRAN) IV  Assesment: She is admitted with health care associated pneumonia which seems to be from aspiration. She has chronic aspiration syndrome. She has chronic atrial fib which is stable. She has failure to thrive. I think she has acute on chronic diastolic heart failure which is being treated with Lasix. She is still requiring very high flow oxygen so she is not ready to go yet. Principal Problem:   Hospital acquired PNA Active Problems:   Cardiac pacemaker in situ   Systolic murmur   Atrial fibrillation (HCC)   Weakness generalized   GI bleed   HCAP (healthcare-associated pneumonia)   Aspiration pneumonia of both lower lobes ([place])   Palliative care encounter   DNR (do not resuscitate) discussion    Plan: Her oxygen saturations a little bit better today so I will see if we can taper her oxygen. Continue diuresis. Continue antibiotics.    LOS: 8 days   Gerik Coberly L 05/31/2015, 9:14 AM

## 2015-05-31 NOTE — Progress Notes (Signed)
Subjective: Patient slept better last night. She is still on 15 liters of O2. Her breathing is laboured. She is diuresing well.  Objective: Vital signs in last 24 hours: Temp:  [97.8 F (36.6 C)-98.7 F (37.1 C)] 98.7 F (37.1 C) (05/10 0652) Pulse Rate:  [66-79] 66 (05/10 0652) Resp:  [20] 20 (05/10 0652) BP: (144-158)/(57-70) 158/61 mmHg (05/10 0652) SpO2:  [90 %-99 %] 99 % (05/10 0652) Weight change:  Last BM Date: 05/30/15  Intake/Output from previous day: 05/09 0701 - 05/10 0700 In: 0  Out: 950 [Urine:950]  PHYSICAL EXAM General appearance: mild distress and slowed mentation Resp: diminished breath sounds bilaterally and rhonchi bilaterally Cardio: irregularly irregular rhythm GI: soft, non-tender; bowel sounds normal; no masses,  no organomegaly Extremities: extremities normal, atraumatic, no cyanosis or edema  Lab Results:  Results for orders placed or performed during the hospital encounter of 05/22/15 (from the past 48 hour(s))  Basic metabolic panel     Status: Abnormal   Collection Time: 05/31/15  5:34 AM  Result Value Ref Range   Sodium 135 135 - 145 mmol/L   Potassium 4.4 3.5 - 5.1 mmol/L   Chloride 96 (L) 101 - 111 mmol/L   CO2 32 22 - 32 mmol/L   Glucose, Bld 112 (H) 65 - 99 mg/dL   BUN 8 6 - 20 mg/dL   Creatinine, Ser 0.64 0.44 - 1.00 mg/dL   Calcium 9.0 8.9 - 10.3 mg/dL   GFR calc non Af Amer >60 >60 mL/min   GFR calc Af Amer >60 >60 mL/min    Comment: (NOTE) The eGFR has been calculated using the CKD EPI equation. This calculation has not been validated in all clinical situations. eGFR's persistently <60 mL/min signify possible Chronic Kidney Disease.    Anion gap 7 5 - 15    ABGS No results for input(s): PHART, PO2ART, TCO2, HCO3 in the last 72 hours.  Invalid input(s): PCO2 CULTURES No results found for this or any previous visit (from the past 240 hour(s)). Studies/Results: Dg Chest 1 View  05/29/2015  CLINICAL DATA:  Pneumonia EXAM:  CHEST 1 VIEW COMPARISON:  05/23/2015 FINDINGS: Cardiomegaly again noted. Large hiatal hernia again noted. Small bilateral pleural effusion with bilateral basilar hazy atelectasis or infiltrate. Slight worsening from prior exam. Dual lead cardiac pacemaker is unchanged in position. Central vascular congestion without convincing pulmonary edema IMPRESSION: Cardiomegaly. Large hiatal hernia again noted. Small bilateral pleural effusion. Hazy bilateral basilar atelectasis or infiltrate with worsening from prior exam. Central vascular congestion without convincing pulmonary edema. Electronically Signed   By: Lahoma Crocker M.D.   On: 05/29/2015 12:56    Medications: I have reviewed the patient's current medications.  Assesment:   Principal Problem:   Hospital acquired PNA Active Problems:   Cardiac pacemaker in situ   Systolic murmur   Atrial fibrillation (HCC)   Weakness generalized   GI bleed   HCAP (healthcare-associated pneumonia)   Aspiration pneumonia of both lower lobes ([place])   Palliative care encounter   DNR (do not resuscitate) discussion Acute respiratory failure due to multifactorial causes   Plan:  Medications reviewed Continue iv antibiotics Will monitor BMP Continue lasix    LOS: 8 days   Caty Tessler 05/31/2015, 8:08 AM

## 2015-06-01 ENCOUNTER — Inpatient Hospital Stay (HOSPITAL_COMMUNITY): Payer: Medicare Other

## 2015-06-01 LAB — CBC
HCT: 32.7 % — ABNORMAL LOW (ref 36.0–46.0)
Hemoglobin: 10.6 g/dL — ABNORMAL LOW (ref 12.0–15.0)
MCH: 30.6 pg (ref 26.0–34.0)
MCHC: 32.4 g/dL (ref 30.0–36.0)
MCV: 94.5 fL (ref 78.0–100.0)
PLATELETS: 263 10*3/uL (ref 150–400)
RBC: 3.46 MIL/uL — ABNORMAL LOW (ref 3.87–5.11)
RDW: 14.2 % (ref 11.5–15.5)
WBC: 5.7 10*3/uL (ref 4.0–10.5)

## 2015-06-01 LAB — BASIC METABOLIC PANEL
Anion gap: 9 (ref 5–15)
BUN: 8 mg/dL (ref 6–20)
CO2: 33 mmol/L — ABNORMAL HIGH (ref 22–32)
CREATININE: 0.72 mg/dL (ref 0.44–1.00)
Calcium: 8.9 mg/dL (ref 8.9–10.3)
Chloride: 93 mmol/L — ABNORMAL LOW (ref 101–111)
GFR calc Af Amer: >60 mL/min (ref >60)
Glucose, Bld: 95 mg/dL (ref 65–99)
Potassium: 4.2 mmol/L (ref 3.5–5.1)
SODIUM: 135 mmol/L (ref 135–145)

## 2015-06-01 NOTE — Progress Notes (Signed)
Daily Progress Note   Patient Name: [name]       Date: 06/01/2015 DOB: [birth year]  Age: 82 y.o. MRN#: [id number] Attending Physician: Avon Gully, MD Primary Care Physician: Avon Gully, MD Admit Date: 05/22/2015  Reason for Consultation/Follow-up: Disposition, Establishing goals of care and Psychosocial/spiritual support  Subjective: [name] is lying quietly Ashley bed. She opens her eyes and makes eye contact as I enter the room. Celesa, family friend is at bedside. [name] denies pain, trouble breathing, and as usual, tells me that she is okay.  I speak with family friend who shares that she is not been able to get out of the bed for several days now. We talk about immobility, and the importance of being able to move Ashley healing her pneumonia, but also that Ashley her weakened state, she does not have the reserve for much activity. I encourage [name] that I will continue to follow her while Ashley the hospital, and that we know she is doing the best she can and we are too.  Length of Stay: 9  Current Medications: Scheduled Meds:  . amiodarone  200 mg Oral Daily  . amLODipine  5 mg Oral Daily  . Ferrous Fumarate  1 tablet Oral BID  . furosemide  40 mg Oral Daily  . guaiFENesin  1,200 mg Oral BID  . levalbuterol  0.63 mg Nebulization BID  . lisinopril  40 mg Oral Daily  . pantoprazole  40 mg Oral BID AC  . piperacillin-tazobactam (ZOSYN)  IV  3.375 g Intravenous Q8H  . potassium chloride  20 mEq Oral BID  . pravastatin  40 mg Oral Daily    Continuous Infusions:    PRN Meds: ALPRAZolam, bisacodyl, guaiFENesin-codeine, levalbuterol, ondansetron **OR** ondansetron (ZOFRAN) IV  Physical Exam  Constitutional: No distress.  No acute distress  HENT:  Head: Normocephalic and  atraumatic.  Pulmonary/Chest:  15 L via highflow nasal cannula, coarse breath sounds. Mild work of breathing noted, when talking  Abdominal: Soft. She exhibits no distension.  Neurological: She is alert.  Opens eyes as I enter, makes and keeps eye contact.  Skin: Skin is warm and dry.  Nursing note and vitals reviewed.           Vital Signs: BP 147/55 mmHg  Pulse 73  Temp(Src) 97.5 F (  36.4 C) (Oral)  Resp 20  Ht  (1.6 m)  Wt 69.8 kg (153 lb 14.1 oz)  BMI 27.27 kg/m2  SpO2 89% SpO2: SpO2: (!) 89 % O2 Device: O2 Device: High Flow Nasal Cannula O2 Flow Rate: O2 Flow Rate (L/min): 15 L/min  Intake/output summary:  Intake/Output Summary (Last 24 hours) at 06/01/15 1416 Last data filed at 05/31/15 2100  Gross per 24 hour  Intake    300 ml  Output   1950 ml  Net  -1650 ml   LBM: Last BM Date: 05/30/15 Baseline Weight: Weight: 69.8 kg (153 lb 14.1 oz) Most recent weight: Weight: 69.8 kg (153 lb 14.1 oz)       Palliative Assessment/Data:    Flowsheet Rows        Most Recent Value   Intake Tab    Unit at Time of Referral  Med/Surg Unit   Palliative Care Primary Diagnosis  Cancer   Date Notified  05/23/15   Palliative Care Type  New Palliative care   Reason for referral  Advance Care Planning, Clarify Goals of Care   Date of Admission  05/22/15   Date first seen by Palliative Care  05/23/15   # of days Palliative referral response time  0 Day(s)   # of days IP prior to Palliative referral  1   Clinical Assessment    Palliative Performance Scale Score  40%   Pain Max last 24 hours  Not able to report   Pain Min Last 24 hours  Not able to report   Dyspnea Max Last 24 Hours  Not able to report   Dyspnea Min Last 24 hours  Not able to report   Psychosocial & Spiritual Assessment    Palliative Care Outcomes    Patient/Family meeting held?  Yes   Who was at the meeting?  patient, daughter Lance Bosch, DIL [facility]   Palliative Care Outcomes  Clarified goals of care,  Provided advance care planning, Provided psychosocial or spiritual support   Palliative Care follow-up planned  -- [follow up at [facility]]      Patient Active Problem List   Diagnosis Date Noted  . Hospital acquired PNA 05/23/2015  . HCAP (healthcare-associated pneumonia) 05/23/2015  . Aspiration pneumonia of both lower lobes (HCC)   . Palliative care encounter   . DNR (do not resuscitate) discussion   . Acute GI bleeding   . Severe anemia 05/14/2015  . Melena 05/14/2015  . Weakness generalized 05/14/2015  . GI bleed 05/14/2015  . Atrial fibrillation (HCC) 04/26/2015  . Severe tricuspid regurgitation 03/15/2015  . CHF (congestive heart failure) (HCC) 03/08/2015  . Systolic murmur 03/08/2015  . CAD (coronary artery disease) 05/25/2014  . DYSLIPIDEMIA 03/14/2010  . Essential hypertension, benign 03/20/2009  . BRADYCARDIA 03/20/2009  . Cardiac pacemaker Ashley situ 03/20/2009    Palliative Care Assessment & Plan   Patient Profile: [name] is a 83 year old female patient with a past medical history of hypertension, systolic car murmur, cardiac pacemaker, atrial fibrillation, DJD, who came to the hospital ED with complaints of shortness of breath and cough.  She has been found to have recurrent aspiration, and is being treated with antibiotics and high flow oxygen.  At this time her respiratory status is compromising her mobility, and therefore she has become weaker during her hospital stay.  Assessment: as above  Recommendations/Plan:  continue current treatment plan. [name] states her goal is to return to her own home. We have been  unable to wean her oxygen to a level they can be provided Ashley home.  Goals of Care and Additional Recommendations:  Limitations on Scope of Treatment: Treat the treatable at this time, no limitations on care.  Code Status:    Code Status Orders        Start     Ordered   05/23/15 0405  Full code   Continuous     05/23/15 0404    Code Status  History    Date Active Date Inactive Code Status Order ID Comments User Context   05/14/2015 11:59 AM 05/19/2015  2:18 PM Full Code [id number]  Henderson Cloud, MD Inpatient       Prognosis:   < 4 weeks, anticipated due to recurrent aspiration, decreased functional status and mobility.  Discharge Planning:  To Be Determined  Care plan was discussed with Nursing staff, case manager, social worker, and Dr. Mack Hook on next rounds.  Thank you for allowing the Palliative Medicine Team to assist Ashley the care of this patient.   Time Ashley: 1115 Time Out: 1140 Total Time 25 minutes  Prolonged Time Billed  no       Greater than 50%  of this time was spent counseling and coordinating care related to the above assessment and plan.  Journee Kohen A, NP  Please contact Palliative Medicine Team phone at [phone] for questions and concerns.

## 2015-06-01 NOTE — Progress Notes (Signed)
Present with [name] and her daughter [name] and [name].  Ashley Teodosio was resting but she woke up enough to talk about her faith and family. She shared she was doing a lot of thinking about her life and that she was okay with whatever happen to her. Will continue to support.

## 2015-06-01 NOTE — Progress Notes (Signed)
Subjective: Patient is awake and responding to verbal communications. She has intermittent cough with yellowish sputum. No fever or chillls. Her breathing is slightly better. She is still on 15 liters of nasal cannula.  Objective: Vital signs in last 24 hours: Temp:  [97.5 F (36.4 C)-98.1 F (36.7 C)] 97.5 F (36.4 C) (05/10 2150) Pulse Rate:  [71-73] 73 (05/10 2150) Resp:  [20] 20 (05/10 2150) BP: (142-147)/(55-69) 147/55 mmHg (05/10 2150) SpO2:  [90 %-94 %] 94 % (05/10 2150) Weight change:  Last BM Date: 05/30/15  Intake/Output from previous day: 05/10 0701 - 05/11 0700 In: 300 [IV Piggyback:300] Out: 2800 [Urine:2800]  PHYSICAL EXAM General appearance: mild distress and slowed mentation Resp: diminished breath sounds bilaterally and rhonchi bilaterally Cardio: irregularly irregular rhythm GI: soft, non-tender; bowel sounds normal; no masses,  no organomegaly Extremities: extremities normal, atraumatic, no cyanosis or edema  Lab Results:  Results for orders placed or performed during the hospital encounter of 05/22/15 (from the past 48 hour(s))  Basic metabolic panel     Status: Abnormal   Collection Time: 05/31/15  5:34 AM  Result Value Ref Range   Sodium 135 135 - 145 mmol/L   Potassium 4.4 3.5 - 5.1 mmol/L   Chloride 96 (L) 101 - 111 mmol/L   CO2 32 22 - 32 mmol/L   Glucose, Bld 112 (H) 65 - 99 mg/dL   BUN 8 6 - 20 mg/dL   Creatinine, Ser 0.64 0.44 - 1.00 mg/dL   Calcium 9.0 8.9 - 10.3 mg/dL   GFR calc non Af Amer >60 >60 mL/min   GFR calc Af Amer >60 >60 mL/min    Comment: (NOTE) The eGFR has been calculated using the CKD EPI equation. This calculation has not been validated in all clinical situations. eGFR's persistently <60 mL/min signify possible Chronic Kidney Disease.    Anion gap 7 5 - 15  Basic metabolic panel     Status: Abnormal   Collection Time: 06/01/15  5:37 AM  Result Value Ref Range   Sodium 135 135 - 145 mmol/L   Potassium 4.2 3.5 - 5.1  mmol/L   Chloride 93 (L) 101 - 111 mmol/L   CO2 33 (H) 22 - 32 mmol/L   Glucose, Bld 95 65 - 99 mg/dL   BUN 8 6 - 20 mg/dL   Creatinine, Ser 0.72 0.44 - 1.00 mg/dL   Calcium 8.9 8.9 - 10.3 mg/dL   GFR calc non Af Amer >60 >60 mL/min   GFR calc Af Amer >60 >60 mL/min    Comment: (NOTE) The eGFR has been calculated using the CKD EPI equation. This calculation has not been validated in all clinical situations. eGFR's persistently <60 mL/min signify possible Chronic Kidney Disease.    Anion gap 9 5 - 15  CBC     Status: Abnormal   Collection Time: 06/01/15  5:37 AM  Result Value Ref Range   WBC 5.7 4.0 - 10.5 K/uL   RBC 3.46 (L) 3.87 - 5.11 MIL/uL   Hemoglobin 10.6 (L) 12.0 - 15.0 g/dL   HCT 32.7 (L) 36.0 - 46.0 %   MCV 94.5 78.0 - 100.0 fL   MCH 30.6 26.0 - 34.0 pg   MCHC 32.4 30.0 - 36.0 g/dL   RDW 14.2 11.5 - 15.5 %   Platelets 263 150 - 400 K/uL    ABGS No results for input(s): PHART, PO2ART, TCO2, HCO3 in the last 72 hours.  Invalid input(s): PCO2 CULTURES No results found for  this or any previous visit (from the past 240 hour(s)). Studies/Results: No results found.  Medications: I have reviewed the patient's current medications.  Assesment:   Principal Problem:   Hospital acquired PNA Active Problems:   Cardiac pacemaker in situ   Systolic murmur   Atrial fibrillation (HCC)   Weakness generalized   GI bleed   HCAP (healthcare-associated pneumonia)   Aspiration pneumonia of both lower lobes ([place])   Palliative care encounter   DNR (do not resuscitate) discussion Acute respiratory failure due to multifactorial causes   Plan:  Medications reviewed Continue iv antibiotics Will monitor BMP Continue lasix Will repeat chest x-ray.    LOS: 9 days   [name] 06/01/2015, 8:16 AM

## 2015-06-01 NOTE — Progress Notes (Signed)
Subjective: She looks a little better. According to family she coughed up more sputum last night. She is still requiring 15 L nasal cannula.  Objective: Vital signs in last 24 hours: Temp:  [97.5 F (36.4 C)-98.1 F (36.7 C)] 97.5 F (36.4 C) (05/10 2150) Pulse Rate:  [71-73] 73 (05/10 2150) Resp:  [20] 20 (05/10 2150) BP: (142-147)/(55-69) 147/55 mmHg (05/10 2150) SpO2:  [90 %-94 %] 94 % (05/10 2150) Weight change:  Last BM Date: 05/30/15  Intake/Output from previous day: 05/10 0701 - 05/11 0700 In: 300 [IV Piggyback:300] Out: 2800 [Urine:2800]  PHYSICAL EXAM General appearance: alert and mild distress Resp: rhonchi bilaterally Cardio: irregularly irregular rhythm GI: soft, non-tender; bowel sounds normal; no masses,  no organomegaly Extremities: extremities normal, atraumatic, no cyanosis or edema  Lab Results:  Results for orders placed or performed during the hospital encounter of 05/22/15 (from the past 48 hour(s))  Basic metabolic panel     Status: Abnormal   Collection Time: 05/31/15  5:34 AM  Result Value Ref Range   Sodium 135 135 - 145 mmol/L   Potassium 4.4 3.5 - 5.1 mmol/L   Chloride 96 (L) 101 - 111 mmol/L   CO2 32 22 - 32 mmol/L   Glucose, Bld 112 (H) 65 - 99 mg/dL   BUN 8 6 - 20 mg/dL   Creatinine, Ser 0.64 0.44 - 1.00 mg/dL   Calcium 9.0 8.9 - 10.3 mg/dL   GFR calc non Af Amer >60 >60 mL/min   GFR calc Af Amer >60 >60 mL/min    Comment: (NOTE) The eGFR has been calculated using the CKD EPI equation. This calculation has not been validated in all clinical situations. eGFR's persistently <60 mL/min signify possible Chronic Kidney Disease.    Anion gap 7 5 - 15  Basic metabolic panel     Status: Abnormal   Collection Time: 06/01/15  5:37 AM  Result Value Ref Range   Sodium 135 135 - 145 mmol/L   Potassium 4.2 3.5 - 5.1 mmol/L   Chloride 93 (L) 101 - 111 mmol/L   CO2 33 (H) 22 - 32 mmol/L   Glucose, Bld 95 65 - 99 mg/dL   BUN 8 6 - 20 mg/dL    Creatinine, Ser 0.72 0.44 - 1.00 mg/dL   Calcium 8.9 8.9 - 10.3 mg/dL   GFR calc non Af Amer >60 >60 mL/min   GFR calc Af Amer >60 >60 mL/min    Comment: (NOTE) The eGFR has been calculated using the CKD EPI equation. This calculation has not been validated in all clinical situations. eGFR's persistently <60 mL/min signify possible Chronic Kidney Disease.    Anion gap 9 5 - 15  CBC     Status: Abnormal   Collection Time: 06/01/15  5:37 AM  Result Value Ref Range   WBC 5.7 4.0 - 10.5 K/uL   RBC 3.46 (L) 3.87 - 5.11 MIL/uL   Hemoglobin 10.6 (L) 12.0 - 15.0 g/dL   HCT 32.7 (L) 36.0 - 46.0 %   MCV 94.5 78.0 - 100.0 fL   MCH 30.6 26.0 - 34.0 pg   MCHC 32.4 30.0 - 36.0 g/dL   RDW 14.2 11.5 - 15.5 %   Platelets 263 150 - 400 K/uL    ABGS No results for input(s): PHART, PO2ART, TCO2, HCO3 in the last 72 hours.  Invalid input(s): PCO2 CULTURES No results found for this or any previous visit (from the past 240 hour(s)). Studies/Results: No results found.  Medications:  Prior to Admission:  Prescriptions prior to admission  Medication Sig Dispense Refill Last Dose  . acetaminophen (TYLENOL) 650 MG CR tablet Take 650 mg by mouth every 8 (eight) hours as needed for pain.    Past Week at Unknown time  . amiodarone (PACERONE) 200 MG tablet Take 1 tablet (200 mg total) by mouth daily. 90 tablet 3 05/22/2015 at Unknown time  . amLODipine (NORVASC) 5 MG tablet Take 1 tablet (5 mg total) by mouth daily. 90 tablet 3 05/22/2015 at Unknown time  . calcium-vitamin D (OSCAL WITH D) 500-200 MG-UNIT per tablet Take 1 tablet by mouth daily.     05/22/2015 at Unknown time  . ferrous sulfate 325 (65 FE) MG EC tablet Take 65 mg by mouth 3 (three) times daily with meals.   05/22/2015 at Unknown time  . furosemide (LASIX) 40 MG tablet Take 1 tablet (40 mg total) by mouth daily. (Patient taking differently: Take 20 mg by mouth daily. Decreased to 20 mg daily. May take an extra 20 mg if leg swelling/weight gain  occurs.) 90 tablet 3 Past Week at Unknown time  . lisinopril (PRINIVIL,ZESTRIL) 40 MG tablet Take 1 tablet (40 mg total) by mouth daily. 90 tablet 0 05/22/2015 at Unknown time  . pantoprazole (PROTONIX) 40 MG tablet Take 1 tablet (40 mg total) by mouth 2 (two) times daily before a meal. 60 tablet 3 05/22/2015 at Unknown time  . potassium chloride SA (K-DUR,KLOR-CON) 20 MEQ tablet Take 1 tablet (20 mEq total) by mouth daily. 90 tablet 3 05/22/2015 at Unknown time  . pravastatin (PRAVACHOL) 40 MG tablet Take 40 mg by mouth daily.   05/22/2015 at Unknown time   Scheduled: . amiodarone  200 mg Oral Daily  . amLODipine  5 mg Oral Daily  . Ferrous Fumarate  1 tablet Oral BID  . furosemide  40 mg Oral Daily  . guaiFENesin  1,200 mg Oral BID  . levalbuterol  0.63 mg Nebulization BID  . lisinopril  40 mg Oral Daily  . pantoprazole  40 mg Oral BID AC  . piperacillin-tazobactam (ZOSYN)  IV  3.375 g Intravenous Q8H  . potassium chloride  20 mEq Oral BID  . pravastatin  40 mg Oral Daily   Continuous:  UJW:[id number], bisacodyl, guaiFENesin-codeine, levalbuterol, ondansetron **OR** ondansetron (ZOFRAN) IV  Assesment: She has acute on chronic respiratory failure with healthcare associated pneumonia and chronic aspiration. She is still on high flow oxygen. She is also being treated for heart failure and has received diuresis. I think she is a little bit better. Principal Problem:   Hospital acquired PNA Active Problems:   Cardiac pacemaker in situ   Systolic murmur   Atrial fibrillation (HCC)   Weakness generalized   GI bleed   HCAP (healthcare-associated pneumonia)   Aspiration pneumonia of both lower lobes [facility])   Palliative care encounter   DNR (do not resuscitate) discussion    Plan: Continue current treatments    LOS: 9 days   Lou Irigoyen L 06/01/2015, 8:52 AM

## 2015-06-01 NOTE — Care Management Note (Signed)
Case Management Note  Patient Details  Name: [name] MRN: [id number] Date of Birth: [birth year]  Subjective/Objective: Patient is still on High flow O2 15 LPM. Patient still Full code.  Patient remains on IVABX and also IV Lasix.                    Action/Plan:  Monitor for changing discharge plan. Patient able to make decisions for self and wants to return home. Family and patient have chosen home with Home Health with [facility].  Case discussed at [facility].  Medical director wants patient to discharge with High risk initiative program with [facility]. Patient not ready for discharge. More to follow.    Expected Discharge Date:                  Expected Discharge Plan:  Home w Hospice Care  In-House Referral:     Discharge planning Services  CM Consult  Post Acute Care Choice:  Durable Medical Equipment Choice offered to:  Adult Children  DME Arranged:  Oxygen, Hospital bed DME Agency:  [facility]  HH Arranged:  RN, PT, Nurse's Aide, Speech Therapy, Social Work [facility] Agency:     Status of Service:  In process, will continue to follow  Medicare Important Message Given:  Yes Date Medicare IM Given:    Medicare IM give by:    Date Additional Medicare IM Given:    Additional Medicare Important Message give by:     If discussed at Long Length of Stay Meetings, dates discussed:  06/01/2015    Additional Comments:  Adonis Huguenin, RN 06/01/2015, 12:08 PM

## 2015-06-02 LAB — BASIC METABOLIC PANEL
Anion gap: 5 (ref 5–15)
BUN: 8 mg/dL (ref 6–20)
CALCIUM: 8.5 mg/dL — AB (ref 8.9–10.3)
CO2: 35 mmol/L — AB (ref 22–32)
Chloride: 92 mmol/L — ABNORMAL LOW (ref 101–111)
Creatinine, Ser: 0.78 mg/dL (ref 0.44–1.00)
GFR calc Af Amer: >60 mL/min (ref >60)
GLUCOSE: 94 mg/dL (ref 65–99)
Potassium: 3.9 mmol/L (ref 3.5–5.1)
Sodium: 132 mmol/L — ABNORMAL LOW (ref 135–145)

## 2015-06-02 NOTE — Progress Notes (Signed)
Subjective: She is overall about the same. She has no new complaints. She is still having trouble breathing. She is still requiring high flow oxygen  Objective: Vital signs in last 24 hours: Temp:  [97.1 F (36.2 C)-98.1 F (36.7 C)] 98.1 F (36.7 C) (05/12 0635) Pulse Rate:  [73-77] 77 (05/12 0635) Resp:  [18-20] 18 (05/12 0635) BP: (122-132)/(60-71) 128/68 mmHg (05/12 0635) SpO2:  [89 %-93 %] 89 % (05/12 0813) Weight change:  Last BM Date: 05/30/15  Intake/Output from previous day: 05/11 0701 - 05/12 0700 In: 460 [P.O.:460] Out: 2000 [Urine:2000]  PHYSICAL EXAM General appearance: alert and mild distress Resp: clear to auscultation bilaterally Cardio: regular rate and rhythm, S1, S2 normal, no murmur, click, rub or gallop GI: soft, non-tender; bowel sounds normal; no masses,  no organomegaly Extremities: extremities normal, atraumatic, no cyanosis or edema  Lab Results:  Results for orders placed or performed during the hospital encounter of 05/22/15 (from the past 48 hour(s))  Basic metabolic panel     Status: Abnormal   Collection Time: 06/01/15  5:37 AM  Result Value Ref Range   Sodium 135 135 - 145 mmol/L   Potassium 4.2 3.5 - 5.1 mmol/L   Chloride 93 (L) 101 - 111 mmol/L   CO2 33 (H) 22 - 32 mmol/L   Glucose, Bld 95 65 - 99 mg/dL   BUN 8 6 - 20 mg/dL   Creatinine, Ser 0.72 0.44 - 1.00 mg/dL   Calcium 8.9 8.9 - 10.3 mg/dL   GFR calc non Af Amer >60 >60 mL/min   GFR calc Af Amer >60 >60 mL/min    Comment: (NOTE) The eGFR has been calculated using the CKD EPI equation. This calculation has not been validated in all clinical situations. eGFR's persistently <60 mL/min signify possible Chronic Kidney Disease.    Anion gap 9 5 - 15  CBC     Status: Abnormal   Collection Time: 06/01/15  5:37 AM  Result Value Ref Range   WBC 5.7 4.0 - 10.5 K/uL   RBC 3.46 (L) 3.87 - 5.11 MIL/uL   Hemoglobin 10.6 (L) 12.0 - 15.0 g/dL   HCT 32.7 (L) 36.0 - 46.0 %   MCV 94.5 78.0  - 100.0 fL   MCH 30.6 26.0 - 34.0 pg   MCHC 32.4 30.0 - 36.0 g/dL   RDW 14.2 11.5 - 15.5 %   Platelets 263 150 - 400 K/uL  Basic metabolic panel     Status: Abnormal   Collection Time: 06/02/15  5:56 AM  Result Value Ref Range   Sodium 132 (L) 135 - 145 mmol/L   Potassium 3.9 3.5 - 5.1 mmol/L   Chloride 92 (L) 101 - 111 mmol/L   CO2 35 (H) 22 - 32 mmol/L   Glucose, Bld 94 65 - 99 mg/dL   BUN 8 6 - 20 mg/dL   Creatinine, Ser 0.78 0.44 - 1.00 mg/dL   Calcium 8.5 (L) 8.9 - 10.3 mg/dL   GFR calc non Af Amer >60 >60 mL/min   GFR calc Af Amer >60 >60 mL/min    Comment: (NOTE) The eGFR has been calculated using the CKD EPI equation. This calculation has not been validated in all clinical situations. eGFR's persistently <60 mL/min signify possible Chronic Kidney Disease.    Anion gap 5 5 - 15    ABGS No results for input(s): PHART, PO2ART, TCO2, HCO3 in the last 72 hours.  Invalid input(s): PCO2 CULTURES No results found for this or  any previous visit (from the past 240 hour(s)). Studies/Results: Dg Chest Port 1 View  06/01/2015  CLINICAL DATA:  Recent pneumonia EXAM: PORTABLE CHEST 1 VIEW COMPARISON:  May 2017 and May 23, 2015 FINDINGS: There is a sizable hiatal hernia, better seen on recent prior studies. Heart is mildly enlarged with pulmonary venous hypertension. There are bilateral pleural effusions, stable on the right is slightly larger on the left. There is mild interstitial edema. There is atelectatic change in the bases. There is questionable airspace consolidation in the bases as well. Pacemaker leads are attached to the right atrium and right ventricle. No pneumothorax. No adenopathy evident. IMPRESSION: Findings consistent with a degree of congestive heart failure. Superimposed pneumonia in the bases cannot be excluded. Hiatal hernia present. Cardiac silhouette and pacemaker lead positions are stable. Electronically Signed   By: Lowella Grip III M.D.   On: 06/01/2015 14:38     Medications:  Prior to Admission:  Prescriptions prior to admission  Medication Sig Dispense Refill Last Dose  . acetaminophen (TYLENOL) 650 MG CR tablet Take 650 mg by mouth every 8 (eight) hours as needed for pain.    Past Week at Unknown time  . amiodarone (PACERONE) 200 MG tablet Take 1 tablet (200 mg total) by mouth daily. 90 tablet 3 05/22/2015 at Unknown time  . amLODipine (NORVASC) 5 MG tablet Take 1 tablet (5 mg total) by mouth daily. 90 tablet 3 05/22/2015 at Unknown time  . calcium-vitamin D (OSCAL WITH D) 500-200 MG-UNIT per tablet Take 1 tablet by mouth daily.     05/22/2015 at Unknown time  . ferrous sulfate 325 (65 FE) MG EC tablet Take 65 mg by mouth 3 (three) times daily with meals.   05/22/2015 at Unknown time  . furosemide (LASIX) 40 MG tablet Take 1 tablet (40 mg total) by mouth daily. (Patient taking differently: Take 20 mg by mouth daily. Decreased to 20 mg daily. May take an extra 20 mg if leg swelling/weight gain occurs.) 90 tablet 3 Past Week at Unknown time  . lisinopril (PRINIVIL,ZESTRIL) 40 MG tablet Take 1 tablet (40 mg total) by mouth daily. 90 tablet 0 05/22/2015 at Unknown time  . pantoprazole (PROTONIX) 40 MG tablet Take 1 tablet (40 mg total) by mouth 2 (two) times daily before a meal. 60 tablet 3 05/22/2015 at Unknown time  . potassium chloride SA (K-DUR,KLOR-CON) 20 MEQ tablet Take 1 tablet (20 mEq total) by mouth daily. 90 tablet 3 05/22/2015 at Unknown time  . pravastatin (PRAVACHOL) 40 MG tablet Take 40 mg by mouth daily.   05/22/2015 at Unknown time   Scheduled: . amiodarone  200 mg Oral Daily  . amLODipine  5 mg Oral Daily  . Ferrous Fumarate  1 tablet Oral BID  . furosemide  40 mg Oral Daily  . guaiFENesin  1,200 mg Oral BID  . levalbuterol  0.63 mg Nebulization BID  . lisinopril  40 mg Oral Daily  . pantoprazole  40 mg Oral BID AC  . piperacillin-tazobactam (ZOSYN)  IV  3.375 g Intravenous Q8H  . potassium chloride  20 mEq Oral BID  . pravastatin  40 mg Oral  Daily   Continuous:  VEL:[id number], bisacodyl, guaiFENesin-codeine, levalbuterol, ondansetron **OR** ondansetron (ZOFRAN) IV  Assesment: She is in the hospital with acute on chronic respiratory failure with healthcare associated pneumonia from aspiration. She is still requiring very high flow oxygen. She has acute on chronic heart failure also. Principal Problem:   Hospital acquired PNA Active Problems:  Cardiac pacemaker in situ   Systolic murmur   Atrial fibrillation (HCC)   Weakness generalized   GI bleed   HCAP (healthcare-associated pneumonia)   Aspiration pneumonia of both lower lobes [facility])   Palliative care encounter   DNR (do not resuscitate) discussion    Plan: Continue treatments. Attempt to wean her oxygen.    LOS: 10 days   Rohnan Bartleson L 06/02/2015, 8:50 AM

## 2015-06-02 NOTE — Progress Notes (Signed)
Physical Therapy Treatment Patient Details Name: [name] MRN: [id number] DOB: [birth year] Today's Date: 06/02/2015    History of Present Illness [name] is a 82 y.o. female with medical history significant of DJD, HTN, systolic murmur, cardiac pacemaker, and atrial fibrillation presented with daughter and brother with complaints of SOB secondary to cough that onset around 9am this morning. Per daughter bedside she was having breakfast this morning and was noted to have intermittent cough through out the day. Daughter denies any swallow studies. She does report recent recurrence of issues with eating and drinking. 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 Daughter also reported a speech evaluation was recommended but has not yet been done.     PT Comments    Pt received sitting up in the chair, and was agreeable to PT tx.  Pt is very determined to get stronger, and was able to complete seated exercises, as well as transfer chair <> BSC with RW and Mod A.  Continue to encourage upright seated posture for improved breathing.     Follow Up Recommendations  SNF;Home health PT;Supervision/Assistance - 24 hour     Equipment Recommendations  None recommended by PT    Recommendations for Other Services       Precautions / Restrictions Precautions Precautions: Fall Restrictions Weight Bearing Restrictions: No    Mobility  Bed Mobility                  Transfers Overall transfer level: Needs assistance Equipment used: Rolling walker (2 wheeled) Transfers: Sit to/from [employer] Transfers Sit to Stand: Mod assist Stand pivot transfers: Mod assist       General transfer comment: sit<>stand x 2 trials when pt expressed that she needed to have a BM, therefore BSC pulled up, and pt was able to transfer onto the [facility].  Ambulation/Gait                 Stairs            Wheelchair  Mobility    Modified Rankin (Stroke Patients Only)       Balance           Standing balance support: Bilateral upper extremity supported Standing balance-Leahy Scale: Poor                      Cognition Arousal/Alertness: Lethargic Behavior During Therapy: WFL for tasks assessed/performed Overall Cognitive Status: Within Functional Limits for tasks assessed                      Exercises General Exercises - Upper Extremity Shoulder Flexion: Strengthening;10 reps;Both;Seated;Other (comment) (assist required for R UE due to arthritis in the shoulder. ) General Exercises - Lower Extremity Long Arc Quad: Strengthening;Both;10 reps;Seated Hip Flexion/Marching: Strengthening;Both;10 reps;Seated Other Exercises Other Exercises: Anterior weight shifting in the chair with B UE's on the arm rests, and using UE's to pull trunk fwd.  x 5 reps. vc's for head fwd posture.     General Comments        Pertinent Vitals/Pain      Home Living                      Prior Function            PT Goals (current goals can now be found in the care plan section) Acute Rehab PT Goals  Patient Stated Goal: Pt states she wants to go home. PT Goal Formulation: With patient/family Time For Goal Achievement: 06/08/15 Potential to Achieve Goals: Fair Progress towards PT goals: Progressing toward goals    Frequency  Min 3X/week    PT Plan Current plan remains appropriate    Co-evaluation             End of Session Equipment Utilized During Treatment: Gait belt Activity Tolerance: Patient limited by fatigue Patient left: with call bell/phone within reach;with family/visitor present;Other (comment) (Left up on BSC to have a BM - RN aware. )     Time: 1610-9604 PT Time Calculation (min) (ACUTE ONLY): 27 min  Charges:  $Therapeutic Exercise: 8-22 mins $Therapeutic Activity: 8-22 mins                    G Codes:      Beth Midge Momon, PT, DPT X: [id number]    06/02/2015, 3:49 PM

## 2015-06-02 NOTE — Care Management Note (Addendum)
Case Management Note  Patient Details  Name: [name] MRN: [id number] Date of Birth: Nov 25, 1920  Subjective/Objective: Case discussed with Director Leim Fabry  And assistant  Director Nicolasa Ducking of Case management, for clarification and approval to proceed with plan of care. Family( adult children) have made choice of agency after much thought and discussion. Plan has been discussed today with pulmonologist Dr Juanetta Gosling , attending physician Dr Felecia Shelling , Family representative Horald Chestnut at [facility] health, and Erick Colace at [place].  Plan is for patient to discharge home with Hospice services provided by [facility].  [facility] aware that patient is full code and is willing to accept.   Patient wants to go home with family. Family will provide 24 hour support to the patient.  Patient and family understands prognosis.  [facility] can provide O2 at 15 HFNC in the home and will deliver hospital bed and all appropriate equipment. Patient will be transitioned to Oral antibiotics (per pulmonolgist and discussed with attending).  [facility] will provide daily RN vists to home as well as Child psychotherapist.  NCM director agrees that Hospice services can provide more support than HRI program. Attending and pulmonologist agree that patient may be discharged this weekend After home is set up. [facility] will coordinate equipment delivery this afternoon.                     Action/Plan: Home with hospice following care.   Expected Discharge Date:                  Expected Discharge Plan:  Home w Hospice Care  In-House Referral:     Discharge planning Services  CM Consult  Post Acute Care Choice:  Durable Medical Equipment Choice offered to:  Adult Children  DME Arranged:  Oxygen, Hospital bed DME Agency:  [facility]  HH Arranged:  RN, PT, Nurse's Aide, Speech Therapy, Social Work [facility] Agency:     Status of Service:  In process, will continue to follow  Medicare Important  Message Given:  Yes Date Medicare IM Given:    Medicare IM give by:    Date Additional Medicare IM Given:    Additional Medicare Important Message give by:     If discussed at Long Length of Stay Meetings, dates discussed:    Additional Comments:  Adonis Huguenin, RN 06/02/2015, 6:39 PM

## 2015-06-02 NOTE — Progress Notes (Signed)
Physical Therapy Treatment Patient Details Name: [name] MRN: [id number] DOB: 12-02-1920 Today's Date: 06/02/2015    History of Present Illness [name] is a 82 y.o. female with medical history significant of DJD, HTN, systolic murmur, cardiac pacemaker, and atrial fibrillation presented with daughter and brother with complaints of SOB secondary to cough that onset around 9am this morning. Per daughter bedside she was having breakfast this morning and was noted to have intermittent cough through out the day. Daughter denies any swallow studies. She does report recent recurrence of issues with eating and drinking. 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 Daughter also reported a speech evaluation was recommended but has not yet been done.     PT Comments    Pt received in bed, dtr present, and son stepped out for tx session.  Pt with increased coughing during tx.  Pt participated well with LE exercises, and stated "I have to try" for all PT tx.  Pt required Mod A for supine<>sit transfer, and Mod A for sit<>stand<>chair with RW.  Encouraged pt to work on posture while sitting on the EOB.  SpO2 remained 90%-93% on 15L of HF.  Pt continues to be extremely weak, and requiring assistance for all aspects of ADL's.    Follow Up Recommendations  SNF;Home health PT;Supervision/Assistance - 24 hour     Equipment Recommendations  None recommended by PT    Recommendations for Other Services       Precautions / Restrictions Precautions Precautions: Fall Restrictions Weight Bearing Restrictions: No    Mobility  Bed Mobility Overal bed mobility: Needs Assistance Bed Mobility: Supine to Sit     Supine to sit: Mod assist;Max assist;HOB elevated     General bed mobility comments: Pt required assistance to scoot hips towards the EOB, and assistance to lift trunk up off the bed. Increased time.   Transfers Overall  transfer level: Needs assistance Equipment used: Rolling walker (2 wheeled)   Sit to Stand: Mod assist Stand pivot transfers: Mod assist       General transfer comment: sit<>stand transfer x 2 trials.  Vc's for pt to push up from the bed, and tc's for hip extension. Pt was able to take a few steps over to the chair.  Fatigued quickly.  Poor eccentric control lowering into the chair. SpO2 briefly remained between 90-93% while on 15L of high flow.   Ambulation/Gait                 Stairs            Wheelchair Mobility    Modified Rankin (Stroke Patients Only)       Balance Overall balance assessment: Needs assistance Sitting-balance support: Bilateral upper extremity supported Sitting balance-Leahy Scale: Poor Sitting balance - Comments: Pt was able to sit on the EOB for ~64min, however, extremely kyphotic posture with posteriorly tilted pelvis.  Manual assist provided to lumbar spine to assist with posture.  Extreme head fwd position.  Pt needs multiple vc's for upright posture.                             Cognition Arousal/Alertness: Lethargic Behavior During Therapy: WFL for tasks assessed/performed Overall Cognitive Status: Within Functional Limits for tasks assessed                      Exercises General  Exercises - Lower Extremity Ankle Circles/Pumps: AROM;Both;10 reps;Supine Quad Sets: Strengthening;Both;Supine;10 reps Short Arc Quad: Strengthening;Both;10 reps;Supine Heel Slides: Strengthening;Both;Supine;10 reps Straight Leg Raises: Strengthening;Both;Supine;10 reps    General Comments        Pertinent Vitals/Pain Pain Assessment: No/denies pain    Home Living                      Prior Function            PT Goals (current goals can now be found in the care plan section) Acute Rehab PT Goals Patient Stated Goal: Pt states she wants to go home. PT Goal Formulation: With patient/family Time For Goal Achievement:  06/08/15 Progress towards PT goals: Not progressing toward goals - comment    Frequency  Min 3X/week    PT Plan Current plan remains appropriate    Co-evaluation             End of Session Equipment Utilized During Treatment: Gait belt Activity Tolerance: Patient limited by fatigue Patient left: in chair;with family/visitor present;Other (comment) (hoyer sling left under pt in the chair. )     Time:  1510- 1552  Total time: 42 min  Charges:    1 unit therapeutic exercise   2 units therapeutic activity                    G Codes:      Beth Kayte Borchard, PT, DPT X: [id number]  06/01/2015

## 2015-06-02 NOTE — Care Management Important Message (Signed)
Important Message  Patient Details  Name: [name] MRN: [id number] Date of Birth: July 29, 1920   Medicare Important Message Given:  Yes    Adonis Huguenin, RN 06/02/2015, 9:52 AM

## 2015-06-02 NOTE — Progress Notes (Addendum)
Initial Nutrition Assessment  DOCUMENTATION CODES:  Non-severe (moderate) malnutrition in context of acute illness/injury   Pt meets criteria for MODERATE MALNUTRITION in the context of Acute Illness as evidenced by Mild muscle/fat wasting and an estimated oral intake that met < or equal to 50% of estimated needs for > or equal to 5 days.   INTERVENTION:  Continue Magic cup TID with meals, each supplement provides 290 kcal and 9 grams of protein  Food requested as desired  NUTRITION DIAGNOSIS:  Increased nutrient needs related to acute illness/resp failure as evidenced by estimated nutritional requirements for this condition  GOAL:  Patient will meet greater than or equal to 90% of their needs  MONITOR:  PO intake, Supplement acceptance, Diet advancement, Labs, I & O's  REASON FOR ASSESSMENT:  LOS    ASSESSMENT:  82 y/o female PMhx DJD, htn, pacemaker, afib, anemia, anxiety who had originally presented with SOB and was admitted for Aspiration PNA. Pt has had complicated hospital course and has been admitted for 10 days. She is requiring very high flow oxygen which has prevented her from leaving. MD still trying to wean.  RD assessing due to long length of stay.   On RD arrival pt is asleep and a family friend is at bedside. She reports that pt has been doing better with her meals recently. EMR documentation show that patient has been able to eat 25-50% of her meals for the last two days. She went ~ 4 days with no intake prior to that.   Pt's current diet is restrictive. However she is on thin liquids. Family friend was list some liquid foods she thought the patient would like. She does not believe patient is receiving any items she doesn't like. She believes patient was eating ~50% of the magic cup supplements. This family friend has not noted any overt signs of aspiration though she is not present at all meals. Pt has not reported to her any n/v/c/d. Last BM was today.   Physical  exam kept limited in attempt to not wake patient. Subjectively, pt does not look her BMI and appears much more frail. Potentially, her height is underestimated. She has noticeable amount of muscle wasting in clavicular region. She has atleast mild depletion of her tricep fat stores, though didn't not lift arms to properly assess. She exhibits mild lower extremity edema. Face slightly sunken with some wasting of temporal and orbital muscle/fat.   Will order desired items.   Labs reviewed: Na+  Cl decreasing, H/H improving.    Recent Labs Lab 05/31/15 0534 06/01/15 0537 06/02/15 0556  NA 135 135 132*  K 4.4 4.2 3.9  CL 96* 93* 92*  CO2 32 33* 35*  BUN '8 8 8  '$ CREATININE 0.64 0.72 0.78  CALCIUM 9.0 8.9 8.5*  GLUCOSE 112* 95 94    Diet Order:  DIET - DYS 1 Room service appropriate?: Yes; Fluid consistency:: Thin  Skin:Dry, ecchymotic  Last BM:  5/12-per friend  Height:  Ht Readings from Last 1 Encounters:  05/23/15 '5\' 3"'$  (1.6 m)   Weight:  Wt Readings from Last 1 Encounters:  05/23/15 153 lb 14.1 oz (69.8 kg)   Wt Readings from Last 10 Encounters:  05/23/15 153 lb 14.1 oz (69.8 kg)  05/16/15 152 lb (68.947 kg)  05/05/15 154 lb 12.8 oz (70.217 kg)  05/01/15 156 lb 9.6 oz (71.033 kg)  04/26/15 156 lb (70.761 kg)  04/20/15 160 lb (72.576 kg)  04/18/15 165 lb (74.844 kg)  03/27/15 152 lb (68.947 kg)  03/15/15 151 lb (68.493 kg)  03/08/15 173 lb (78.472 kg)   Ideal Body Weight:  52.27 kg  BMI:  Body mass index is 27.27 kg/(m^2).  Estimated Nutritional Needs:  Kcal:  1500-1700 kcals (22-25 kcal/kg bw) Protein:  58-68 g Pro (1.1-1.3 g/kg ibw) Fluid:  1.7 liters  EDUCATION NEEDS:  No education needs identified at this time  Burtis Junes RD, LDN Clinical Nutrition Pager: [phone] 06/02/2015 2:57 PM

## 2015-06-02 NOTE — Progress Notes (Addendum)
Subjective: Patient is resting. No majpor improvement in her condition. Her chest x-ray still showing sign of CHF and pneumonia. Objective: Vital signs in last 24 hours: Temp:  [97.1 F (36.2 C)-98.1 F (36.7 C)] 98.1 F (36.7 C) (05/12 0635) Pulse Rate:  [73-77] 77 (05/12 0635) Resp:  [18-20] 18 (05/12 0635) BP: (122-132)/(60-71) 128/68 mmHg (05/12 0635) SpO2:  [89 %-93 %] 89 % (05/12 0813) Weight change:  Last BM Date: 05/30/15  Intake/Output from previous day: 05/11 0701 - 05/12 0700 In: 460 [P.O.:460] Out: 2000 [Urine:2000]  PHYSICAL EXAM General appearance: mild distress and slowed mentation Resp: diminished breath sounds bilaterally and rhonchi bilaterally Cardio: irregularly irregular rhythm GI: soft, non-tender; bowel sounds normal; no masses,  no organomegaly Extremities: extremities normal, atraumatic, no cyanosis or edema  Lab Results:  Results for orders placed or performed during the hospital encounter of 05/22/15 (from the past 48 hour(s))  Basic metabolic panel     Status: Abnormal   Collection Time: 06/01/15  5:37 AM  Result Value Ref Range   Sodium 135 135 - 145 mmol/L   Potassium 4.2 3.5 - 5.1 mmol/L   Chloride 93 (L) 101 - 111 mmol/L   CO2 33 (H) 22 - 32 mmol/L   Glucose, Bld 95 65 - 99 mg/dL   BUN 8 6 - 20 mg/dL   Creatinine, Ser 0.72 0.44 - 1.00 mg/dL   Calcium 8.9 8.9 - 10.3 mg/dL   GFR calc non Af Amer >60 >60 mL/min   GFR calc Af Amer >60 >60 mL/min    Comment: (NOTE) The eGFR has been calculated using the CKD EPI equation. This calculation has not been validated in all clinical situations. eGFR's persistently <60 mL/min signify possible Chronic Kidney Disease.    Anion gap 9 5 - 15  CBC     Status: Abnormal   Collection Time: 06/01/15  5:37 AM  Result Value Ref Range   WBC 5.7 4.0 - 10.5 K/uL   RBC 3.46 (L) 3.87 - 5.11 MIL/uL   Hemoglobin 10.6 (L) 12.0 - 15.0 g/dL   HCT 32.7 (L) 36.0 - 46.0 %   MCV 94.5 78.0 - 100.0 fL   MCH 30.6 26.0  - 34.0 pg   MCHC 32.4 30.0 - 36.0 g/dL   RDW 14.2 11.5 - 15.5 %   Platelets 263 150 - 400 K/uL  Basic metabolic panel     Status: Abnormal   Collection Time: 06/02/15  5:56 AM  Result Value Ref Range   Sodium 132 (L) 135 - 145 mmol/L   Potassium 3.9 3.5 - 5.1 mmol/L   Chloride 92 (L) 101 - 111 mmol/L   CO2 35 (H) 22 - 32 mmol/L   Glucose, Bld 94 65 - 99 mg/dL   BUN 8 6 - 20 mg/dL   Creatinine, Ser 0.78 0.44 - 1.00 mg/dL   Calcium 8.5 (L) 8.9 - 10.3 mg/dL   GFR calc non Af Amer >60 >60 mL/min   GFR calc Af Amer >60 >60 mL/min    Comment: (NOTE) The eGFR has been calculated using the CKD EPI equation. This calculation has not been validated in all clinical situations. eGFR's persistently <60 mL/min signify possible Chronic Kidney Disease.    Anion gap 5 5 - 15    ABGS No results for input(s): PHART, PO2ART, TCO2, HCO3 in the last 72 hours.  Invalid input(s): PCO2 CULTURES No results found for this or any previous visit (from the past 240 hour(s)). Studies/Results: Dg Chest [employer]  1 View  06/01/2015  CLINICAL DATA:  Recent pneumonia EXAM: PORTABLE CHEST 1 VIEW COMPARISON:  May 2017 and May 23, 2015 FINDINGS: There is a sizable hiatal hernia, better seen on recent prior studies. Heart is mildly enlarged with pulmonary venous hypertension. There are bilateral pleural effusions, stable on the right is slightly larger on the left. There is mild interstitial edema. There is atelectatic change in the bases. There is questionable airspace consolidation in the bases as well. Pacemaker leads are attached to the right atrium and right ventricle. No pneumothorax. No adenopathy evident. IMPRESSION: Findings consistent with a degree of congestive heart failure. Superimposed pneumonia in the bases cannot be excluded. Hiatal hernia present. Cardiac silhouette and pacemaker lead positions are stable. Electronically Signed   By: Lowella Grip III M.D.   On: 06/01/2015 14:38    Medications: I have  reviewed the patient's current medications.  Assesment:   Principal Problem:   Hospital acquired PNA Active Problems:   Cardiac pacemaker in situ   Systolic murmur   Atrial fibrillation (HCC)   Weakness generalized   GI bleed   HCAP (healthcare-associated pneumonia)   Aspiration pneumonia of both lower lobes ([place])   Palliative care encounter   DNR (do not resuscitate) discussion Acute respiratory failure due to multifactorial causes   Plan:  Medications reviewed Continue iv antibiotics Will monitor BMP Continue lasix Oxygen therapy and nebulizer    LOS: 10 days   [name] 06/02/2015, 8:41 AM

## 2015-06-03 DIAGNOSIS — E440 Moderate protein-calorie malnutrition: Secondary | ICD-10-CM | POA: Insufficient documentation

## 2015-06-03 MED ORDER — AMOXICILLIN-POT CLAVULANATE 500-125 MG PO TABS: 1.0000 | ORAL_TABLET | Freq: Three times a day (TID) | ORAL | Status: DC

## 2015-06-03 MED ORDER — LEVALBUTEROL HCL 0.63 MG/3ML IN NEBU: 0.6300 mg | INHALATION_SOLUTION | RESPIRATORY_TRACT | Status: DC | PRN

## 2015-06-03 NOTE — Progress Notes (Addendum)
Patient oriented, VSS, pt. Tolerating diet well. No complaints of pain or nausea. Pt. Had IV removed tip intact. Pt. Had prescriptions given. Pt. Voiced understanding of discharge instructions with no further questions. Pt. Discharged via EMS for transport to private residence.  Pt. To receive hospice provided by [facility] and equipment by [facility].

## 2015-06-03 NOTE — Discharge Summary (Signed)
Physician Discharge Summary  Patient ID: [name] MRN: [id number] DOB/AGE: 09-25-1920 82 y.o. Primary Care Physician:Peola Joynt, MD Admit date: 05/22/2015 Discharge date: 06/03/2015    Discharge Diagnoses:   Principal Problem:   Hospital acquired PNA Active Problems:   Cardiac pacemaker in situ   Systolic murmur   Atrial fibrillation (HCC)   Weakness generalized   GI bleed   HCAP (healthcare-associated pneumonia)   Aspiration pneumonia of both lower lobes (HCC)   Palliative care encounter   DNR (do not resuscitate) discussion   Malnutrition of moderate degree Acute on chronic diastolic CHF Dysphagia    Medication List    TAKE these medications        acetaminophen 650 MG CR tablet  Commonly known as:  TYLENOL  Take 650 mg by mouth every 8 (eight) hours as needed for pain.     amiodarone 200 MG tablet  Commonly known as:  PACERONE  Take 1 tablet (200 mg total) by mouth daily.     amLODipine 5 MG tablet  Commonly known as:  NORVASC  Take 1 tablet (5 mg total) by mouth daily.     amoxicillin-clavulanate 500-125 MG tablet  Commonly known as:  AUGMENTIN  Take 1 tablet (500 mg total) by mouth 3 (three) times daily.     calcium-vitamin D 500-200 MG-UNIT tablet  Commonly known as:  OSCAL WITH D  Take 1 tablet by mouth daily.     ferrous sulfate 325 (65 FE) MG EC tablet  Take 65 mg by mouth 3 (three) times daily with meals.     furosemide 40 MG tablet  Commonly known as:  LASIX  Take 1 tablet (40 mg total) by mouth daily.     levalbuterol 0.63 MG/3ML nebulizer solution  Commonly known as:  XOPENEX  Take 3 mLs (0.63 mg total) by nebulization every 4 (four) hours as needed for wheezing or shortness of breath.     lisinopril 40 MG tablet  Commonly known as:  PRINIVIL,ZESTRIL  Take 1 tablet (40 mg total) by mouth daily.     pantoprazole 40 MG tablet  Commonly known as:  PROTONIX  Take 1 tablet (40 mg total) by mouth 2 (two) times daily before a meal.      potassium chloride SA 20 MEQ tablet  Commonly known as:  K-DUR,KLOR-CON  Take 1 tablet (20 mEq total) by mouth daily.     pravastatin 40 MG tablet  Commonly known as:  PRAVACHOL  Take 40 mg by mouth daily.        Discharged Condition:  Slight improvement, patient remained hypoxemic    Consults: Pulmonary  Significant Diagnostic Studies: Dg Chest 1 View  05/29/2015  CLINICAL DATA:  Pneumonia EXAM: CHEST 1 VIEW COMPARISON:  05/23/2015 FINDINGS: Cardiomegaly again noted. Large hiatal hernia again noted. Small bilateral pleural effusion with bilateral basilar hazy atelectasis or infiltrate. Slight worsening from prior exam. Dual lead cardiac pacemaker is unchanged in position. Central vascular congestion without convincing pulmonary edema IMPRESSION: Cardiomegaly. Large hiatal hernia again noted. Small bilateral pleural effusion. Hazy bilateral basilar atelectasis or infiltrate with worsening from prior exam. Central vascular congestion without convincing pulmonary edema. Electronically Signed   By: Natasha Mead M.D.   On: 05/29/2015 12:56   Dg Chest Port 1 View  06/01/2015  CLINICAL DATA:  Recent pneumonia EXAM: PORTABLE CHEST 1 VIEW COMPARISON:  May 2017 and May 23, 2015 FINDINGS: There is a sizable hiatal hernia, better seen on recent prior studies. Heart is mildly enlarged  with pulmonary venous hypertension. There are bilateral pleural effusions, stable on the right is slightly larger on the left. There is mild interstitial edema. There is atelectatic change in the bases. There is questionable airspace consolidation in the bases as well. Pacemaker leads are attached to the right atrium and right ventricle. No pneumothorax. No adenopathy evident. IMPRESSION: Findings consistent with a degree of congestive heart failure. Superimposed pneumonia in the bases cannot be excluded. Hiatal hernia present. Cardiac silhouette and pacemaker lead positions are stable. Electronically Signed   By: Bretta Bang III M.D.   On: 06/01/2015 14:38   Dg Chest Portable 1 View  05/23/2015  CLINICAL DATA:  Patient aspirated at breakfast yesterday and has a cough since then. Generalized weakness. EXAM: PORTABLE CHEST 1 VIEW COMPARISON:  05/14/2015 FINDINGS: Cardiac pacemaker. Diffuse enlargement of the cardiac silhouette which could indicate cardiac enlargement or pericardial effusion. No significant vascular congestion. There are small bilateral pleural effusions with atelectasis or infiltration suggested in both lung bases. Changes could indicate pneumonia. Large esophageal hiatal hernia behind the heart. Calcified and tortuous aorta. No pneumothorax. IMPRESSION: Enlarged cardiac silhouette. Small bilateral pleural effusions with basilar atelectasis or infiltration. Large esophageal hiatal hernia behind the heart. Electronically Signed   By: Burman Nieves M.D.   On: 05/23/2015 00:29   Dg Chest Port 1 View  05/14/2015  CLINICAL DATA:  weakness EXAM: PORTABLE CHEST - 1 VIEW COMPARISON:  01/19/2009 FINDINGS: Stable left subclavian pacemaker. Moderate cardiomegaly. Large hiatal hernia with fluid level. Atheromatous aorta. Subsegmental atelectasis or early infiltrate at the left lung base partially obscures the left diaphragmatic leaflet. Can't exclude small left pleural effusion. No pneumothorax. Visualized skeletal structures are unremarkable. IMPRESSION: 1. Stable cardiomegaly 2. Large hiatal hernia 3. Left lower lobe patchy atelectasis versus early infiltrate. Can't exclude small left effusion. Electronically Signed   By: Corlis Leak M.D.   On: 05/14/2015 09:16    Lab Results: Basic Metabolic Panel:  Recent Labs  86/57/84 0537 06/02/15 0556  NA 135 132*  K 4.2 3.9  CL 93* 92*  CO2 33* 35*  GLUCOSE 95 94  BUN 8 8  CREATININE 0.72 0.78  CALCIUM 8.9 8.5*   Liver Function Tests: No results for input(s): AST, ALT, ALKPHOS, BILITOT, PROT, ALBUMIN in the last 72 hours.   CBC:  Recent Labs   06/01/15 0537  WBC 5.7  HGB 10.6*  HCT 32.7*  MCV 94.5  PLT 263    No results found for this or any previous visit (from the past 240 hour(s)).   Hospital Course:   This is a a 82 years old female who was admitted due to aspiration pneumonia and hypoxemia. Patient had a prolong hospital stay due to severe hypoxemia requiring about 15 liters of oxygen by nasal canula. Patient had recurrent aspiration. She had also superimposed pulmonary congestion due to acute exacerbation of diastolic CHF. Pulmonary consult was done who assisted in her management. Palliative care service was also provide. Patient is a full code. Patient is being discharged with hospice service who agreed to provide 15 liters of oxygen at home.  Discharge Exam: Blood pressure 133/54, pulse 71, temperature 98.5 F (36.9 C), temperature source Oral, resp. rate 18, height  (1.6 m), weight 69.8 kg (153 lb 14.1 oz), SpO2 94 %.   Disposition:  home      Signed: Tico Crotteau   06/03/2015, 9:29 AM

## 2015-06-03 NOTE — Progress Notes (Signed)
CM spoke with nurse on call with [facility].  Patient has received equipment at home to use. Order to placed for home care services. Message was left for weekday CM to follow up with doctor.

## 2015-06-04 ENCOUNTER — Telehealth: Payer: Self-pay | Admitting: Gastroenterology

## 2015-06-04 NOTE — Telephone Encounter (Signed)
Please call pt. HER stomach Bx shows gastritis.    Use Protonix (pantoprazole) 40 mg PO BID FOR 3 MOS THEN ONCE DAILY  FOREVER OPV IN JUN 2017 WITH DR. Jena Gauss E30 DYSPHAGIA/GI BLEED. -

## 2015-06-05 NOTE — Telephone Encounter (Signed)
Sent pt message via mychart

## 2015-06-05 NOTE — Telephone Encounter (Signed)
APPT MADE

## 2015-06-07 ENCOUNTER — Ambulatory Visit: Payer: Medicare Other | Admitting: Physician Assistant

## 2015-06-12 ENCOUNTER — Other Ambulatory Visit: Payer: Self-pay

## 2015-06-12 DIAGNOSIS — D649 Anemia, unspecified: Secondary | ICD-10-CM

## 2015-06-21 DIAGNOSIS — Z950 Presence of cardiac pacemaker: Secondary | ICD-10-CM | POA: Diagnosis not present

## 2015-06-26 ENCOUNTER — Other Ambulatory Visit: Payer: Self-pay

## 2015-06-26 DIAGNOSIS — D649 Anemia, unspecified: Secondary | ICD-10-CM

## 2015-07-03 ENCOUNTER — Telehealth: Payer: Self-pay | Admitting: Internal Medicine

## 2015-07-03 NOTE — Telephone Encounter (Signed)
Lab orders faxed.

## 2015-07-03 NOTE — Telephone Encounter (Signed)
Caregiver called to say that patient is in hospice care and they are going to be doing labs on her and asked for [place] to fax them the labs we need on her. Fax number is [phone]

## 2015-07-31 ENCOUNTER — Encounter: Payer: Self-pay | Admitting: Internal Medicine

## 2015-07-31 ENCOUNTER — Ambulatory Visit (INDEPENDENT_AMBULATORY_CARE_PROVIDER_SITE_OTHER): Payer: Medicare Other | Admitting: Internal Medicine

## 2015-07-31 ENCOUNTER — Other Ambulatory Visit: Payer: Self-pay

## 2015-07-31 VITALS — BP 130/64 | HR 79 | Ht 63.0 in | Wt 135.0 lb

## 2015-07-31 DIAGNOSIS — I442 Atrioventricular block, complete: Secondary | ICD-10-CM

## 2015-07-31 DIAGNOSIS — I2510 Atherosclerotic heart disease of native coronary artery without angina pectoris: Secondary | ICD-10-CM

## 2015-07-31 DIAGNOSIS — I480 Paroxysmal atrial fibrillation: Secondary | ICD-10-CM | POA: Diagnosis not present

## 2015-07-31 DIAGNOSIS — D649 Anemia, unspecified: Secondary | ICD-10-CM

## 2015-07-31 LAB — CUP PACEART INCLINIC DEVICE CHECK
Battery Impedance: 3229 Ohm
Battery Remaining Longevity: 16 mo
Battery Voltage: 2.73 V
Brady Statistic AP VS Percent: 0.7 %
Implantable Lead Implant Date: 20060926
Implantable Lead Location: 753860
Implantable Lead Model: 5076
Implantable Lead Model: 5076
Lead Channel Impedance Value: 469 Ohm
Lead Channel Pacing Threshold Pulse Width: 0.4 ms
Lead Channel Pacing Threshold Pulse Width: 0.6 ms
Lead Channel Setting Pacing Amplitude: 2 V
Lead Channel Setting Pacing Amplitude: 2.5 V
MDC IDC LEAD IMPLANT DT: 20060926
MDC IDC LEAD LOCATION: 753859
MDC IDC MSMT LEADCHNL RA PACING THRESHOLD AMPLITUDE: 1 V
MDC IDC MSMT LEADCHNL RA SENSING INTR AMPL: 2.8 mV
MDC IDC MSMT LEADCHNL RV IMPEDANCE VALUE: 475 Ohm
MDC IDC MSMT LEADCHNL RV PACING THRESHOLD AMPLITUDE: 1 V
MDC IDC MSMT LEADCHNL RV SENSING INTR AMPL: 11.2 mV
MDC IDC SESS DTM: 20170710142247
MDC IDC SET LEADCHNL RV PACING PULSEWIDTH: 0.6 ms
MDC IDC SET LEADCHNL RV SENSING SENSITIVITY: 2.8 mV
MDC IDC STAT BRADY AP VP PERCENT: 24.2 %
MDC IDC STAT BRADY AS VP PERCENT: 70.5 %
MDC IDC STAT BRADY AS VS PERCENT: 4.7 %

## 2015-07-31 NOTE — Patient Instructions (Addendum)
Your physician wants you to follow-up in: 6 Months with Dr. [name]. You will receive a reminder letter in the mail two months in advance. If you don't receive a letter, please call our office to schedule the follow-up appointment.  Your physician recommends that you schedule a follow-up appointment in: 3 Months with the [facility]   Your physician recommends that you continue on your current medications as directed. Please refer to the Current Medication list given to you today.   If you need a refill on your cardiac medications before your next appointment, please call your pharmacy.  Thank you for choosing [facility]!

## 2015-07-31 NOTE — Progress Notes (Signed)
HPI [name] returns today for followup. She is a peasant 30 woman with a h/o CHB, s/p PPM insertion, HTN, and dyslipidemia. In the interim, she has developed sob and PAF, but no syncope. She has edema. She was seen in our office and placed on a beta blocker and Xarelto. She developed anemia and was found to be bleeding and her Xarelto has been stopped.    No Known Allergies   Current Outpatient Prescriptions  Medication Sig Dispense Refill  . acetaminophen (TYLENOL) 650 MG CR tablet Take 650 mg by mouth every 8 (eight) hours as needed for pain.     Marland Kitchen amiodarone (PACERONE) 200 MG tablet Take 1 tablet (200 mg total) by mouth daily. 90 tablet 3  . amLODipine (NORVASC) 5 MG tablet Take 1 tablet (5 mg total) by mouth daily. 90 tablet 3  . ferrous sulfate 325 (65 FE) MG EC tablet Take 65 mg by mouth 3 (three) times daily with meals.    . furosemide (LASIX) 40 MG tablet Take 1 tablet (40 mg total) by mouth daily. (Patient taking differently: Take 20 mg by mouth daily. Decreased to 20 mg daily. May take an extra 20 mg if leg swelling/weight gain occurs.) 90 tablet 3  . lisinopril (PRINIVIL,ZESTRIL) 40 MG tablet Take 1 tablet (40 mg total) by mouth daily. 90 tablet 0  . LORazepam (ATIVAN) 0.5 MG tablet TK 1 T PO Q 4 H PRN FOR ANXIETY  1  . pantoprazole (PROTONIX) 40 MG tablet Take 1 tablet (40 mg total) by mouth 2 (two) times daily before a meal. 60 tablet 3  . potassium chloride SA (K-DUR,KLOR-CON) 20 MEQ tablet Take 1 tablet (20 mEq total) by mouth daily. 90 tablet 3   No current facility-administered medications for this visit.     Past Medical History  Diagnosis Date  . DJD (degenerative joint disease)   . Osteoporosis   . Anxiety   . Sleep apnea   . Seasonal allergies   . Anemia   . Hypertension   . Atrial fibrillation (HCC)     ROS:   All systems reviewed and negative except as noted in the HPI.   Past Surgical History  Procedure Laterality Date  . Pacemaker insertion    .  Hemorrhoid surgery    . Cholecystectomy    . Total hip arthroplasty    . Esophagogastroduodenoscopy N/A 05/16/2015    Procedure: ESOPHAGOGASTRODUODENOSCOPY (EGD);  Surgeon: West Bali, MD;  Location: [facility];  Service: Endoscopy;  Laterality: N/A;     Family History  Problem Relation Age of Onset  . Colon cancer Father   . Colon cancer Paternal Aunt   . Crohn's disease Son      Social History   Social History  . Marital Status: Widowed    Spouse Name: N/A  . Number of Children: N/A  . Years of Education: N/A   Occupational History  . Not on file.   Social History Main Topics  . Smoking status: Never Smoker   . Smokeless tobacco: Not on file  . Alcohol Use: No  . Drug Use: No  . Sexual Activity: Not on file   Other Topics Concern  . Not on file   Social History Narrative   Widowed   Daughter     BP 130/64 mmHg  Pulse 79  Ht  (1.6 m)  Wt 135 lb (61.236 kg)  BMI 23.92 kg/m2  SpO2 98%  Physical Exam:  Well appearing elderly woman,  who looks younger than her stated age and in NAD HEENT: Unremarkable Neck:  6 cm JVD, no thyromegally Back:  No CVA tenderness Lungs:  Clear with no wheezes, rales, or rhonchi. Well healed PPM insertion. HEART:  Regular rate rhythm, no murmurs, no rubs, no clicks Abd:  soft, positive bowel sounds, no organomegally, no rebound, no guarding Ext:  2 plus pulses, no edema, no cyanosis, no clubbing Skin:  No rashes no nodules Neuro:  CN II through XII intact, motor grossly intact   DEVICE  Normal device function.  See PaceArt for details.   Assess/Plan: 1. PAF - she is in rhythm today. She will continue amiodarone. She is not a candidate for additional anti-coagulation. 2. PVC's she has had bigeminal PVC's, and these have improved. 3. HTN  - her blood pressure is reasonably well controlled. Will follow. 4. Chronic diastolic heart failure - she has improved. She will continue her current meds.   [name]

## 2015-08-18 ENCOUNTER — Ambulatory Visit: Payer: Medicare Other | Admitting: Gastroenterology

## 2015-09-19 DIAGNOSIS — I83012 Varicose veins of right lower extremity with ulcer of calf: Secondary | ICD-10-CM | POA: Diagnosis not present

## 2015-09-19 DIAGNOSIS — I83028 Varicose veins of left lower extremity with ulcer other part of lower leg: Secondary | ICD-10-CM | POA: Diagnosis not present

## 2015-09-19 DIAGNOSIS — L97829 Non-pressure chronic ulcer of other part of left lower leg with unspecified severity: Secondary | ICD-10-CM | POA: Diagnosis not present

## 2015-09-19 DIAGNOSIS — I509 Heart failure, unspecified: Secondary | ICD-10-CM | POA: Diagnosis not present

## 2015-09-19 DIAGNOSIS — L97229 Non-pressure chronic ulcer of left calf with unspecified severity: Secondary | ICD-10-CM | POA: Diagnosis not present

## 2015-09-19 DIAGNOSIS — L97219 Non-pressure chronic ulcer of right calf with unspecified severity: Secondary | ICD-10-CM | POA: Diagnosis not present

## 2015-09-19 DIAGNOSIS — I110 Hypertensive heart disease with heart failure: Secondary | ICD-10-CM | POA: Diagnosis not present

## 2015-09-19 DIAGNOSIS — L97212 Non-pressure chronic ulcer of right calf with fat layer exposed: Secondary | ICD-10-CM | POA: Diagnosis not present

## 2015-09-19 DIAGNOSIS — I872 Venous insufficiency (chronic) (peripheral): Secondary | ICD-10-CM | POA: Diagnosis not present

## 2015-09-19 DIAGNOSIS — Z79899 Other long term (current) drug therapy: Secondary | ICD-10-CM | POA: Diagnosis not present

## 2015-09-19 DIAGNOSIS — L97822 Non-pressure chronic ulcer of other part of left lower leg with fat layer exposed: Secondary | ICD-10-CM | POA: Diagnosis not present

## 2015-09-19 DIAGNOSIS — M1990 Unspecified osteoarthritis, unspecified site: Secondary | ICD-10-CM | POA: Diagnosis not present

## 2015-09-19 DIAGNOSIS — Z950 Presence of cardiac pacemaker: Secondary | ICD-10-CM | POA: Diagnosis not present

## 2015-09-19 DIAGNOSIS — I482 Chronic atrial fibrillation: Secondary | ICD-10-CM | POA: Diagnosis not present

## 2015-09-20 DIAGNOSIS — Z950 Presence of cardiac pacemaker: Secondary | ICD-10-CM | POA: Diagnosis not present

## 2015-09-22 DIAGNOSIS — I509 Heart failure, unspecified: Secondary | ICD-10-CM | POA: Diagnosis not present

## 2015-09-22 DIAGNOSIS — Z9981 Dependence on supplemental oxygen: Secondary | ICD-10-CM | POA: Diagnosis not present

## 2015-09-22 DIAGNOSIS — J690 Pneumonitis due to inhalation of food and vomit: Secondary | ICD-10-CM | POA: Diagnosis not present

## 2015-09-22 DIAGNOSIS — I2510 Atherosclerotic heart disease of native coronary artery without angina pectoris: Secondary | ICD-10-CM | POA: Diagnosis not present

## 2015-09-22 DIAGNOSIS — J189 Pneumonia, unspecified organism: Secondary | ICD-10-CM | POA: Diagnosis not present

## 2015-09-22 DIAGNOSIS — I4891 Unspecified atrial fibrillation: Secondary | ICD-10-CM | POA: Diagnosis not present

## 2015-09-22 DIAGNOSIS — J441 Chronic obstructive pulmonary disease with (acute) exacerbation: Secondary | ICD-10-CM | POA: Diagnosis not present

## 2015-09-22 DIAGNOSIS — J9621 Acute and chronic respiratory failure with hypoxia: Secondary | ICD-10-CM | POA: Diagnosis not present

## 2015-09-22 DIAGNOSIS — I110 Hypertensive heart disease with heart failure: Secondary | ICD-10-CM | POA: Diagnosis not present

## 2015-09-22 DIAGNOSIS — E785 Hyperlipidemia, unspecified: Secondary | ICD-10-CM | POA: Diagnosis not present

## 2015-09-22 DIAGNOSIS — Z950 Presence of cardiac pacemaker: Secondary | ICD-10-CM | POA: Diagnosis not present

## 2015-09-22 DIAGNOSIS — Z515 Encounter for palliative care: Secondary | ICD-10-CM | POA: Diagnosis not present

## 2015-09-27 DIAGNOSIS — J189 Pneumonia, unspecified organism: Secondary | ICD-10-CM | POA: Diagnosis not present

## 2015-09-27 DIAGNOSIS — J441 Chronic obstructive pulmonary disease with (acute) exacerbation: Secondary | ICD-10-CM | POA: Diagnosis not present

## 2015-09-27 DIAGNOSIS — J9621 Acute and chronic respiratory failure with hypoxia: Secondary | ICD-10-CM | POA: Diagnosis not present

## 2015-09-27 DIAGNOSIS — I509 Heart failure, unspecified: Secondary | ICD-10-CM | POA: Diagnosis not present

## 2015-09-27 DIAGNOSIS — I110 Hypertensive heart disease with heart failure: Secondary | ICD-10-CM | POA: Diagnosis not present

## 2015-09-27 DIAGNOSIS — J690 Pneumonitis due to inhalation of food and vomit: Secondary | ICD-10-CM | POA: Diagnosis not present

## 2015-09-28 DIAGNOSIS — J9621 Acute and chronic respiratory failure with hypoxia: Secondary | ICD-10-CM | POA: Diagnosis not present

## 2015-09-28 DIAGNOSIS — I509 Heart failure, unspecified: Secondary | ICD-10-CM | POA: Diagnosis not present

## 2015-09-28 DIAGNOSIS — I110 Hypertensive heart disease with heart failure: Secondary | ICD-10-CM | POA: Diagnosis not present

## 2015-09-28 DIAGNOSIS — J189 Pneumonia, unspecified organism: Secondary | ICD-10-CM | POA: Diagnosis not present

## 2015-09-28 DIAGNOSIS — J690 Pneumonitis due to inhalation of food and vomit: Secondary | ICD-10-CM | POA: Diagnosis not present

## 2015-09-28 DIAGNOSIS — J441 Chronic obstructive pulmonary disease with (acute) exacerbation: Secondary | ICD-10-CM | POA: Diagnosis not present

## 2015-09-29 DIAGNOSIS — J189 Pneumonia, unspecified organism: Secondary | ICD-10-CM | POA: Diagnosis not present

## 2015-09-29 DIAGNOSIS — J441 Chronic obstructive pulmonary disease with (acute) exacerbation: Secondary | ICD-10-CM | POA: Diagnosis not present

## 2015-09-29 DIAGNOSIS — J690 Pneumonitis due to inhalation of food and vomit: Secondary | ICD-10-CM | POA: Diagnosis not present

## 2015-09-29 DIAGNOSIS — I509 Heart failure, unspecified: Secondary | ICD-10-CM | POA: Diagnosis not present

## 2015-09-29 DIAGNOSIS — J9621 Acute and chronic respiratory failure with hypoxia: Secondary | ICD-10-CM | POA: Diagnosis not present

## 2015-09-29 DIAGNOSIS — I110 Hypertensive heart disease with heart failure: Secondary | ICD-10-CM | POA: Diagnosis not present

## 2015-10-04 DIAGNOSIS — J9621 Acute and chronic respiratory failure with hypoxia: Secondary | ICD-10-CM | POA: Diagnosis not present

## 2015-10-04 DIAGNOSIS — I509 Heart failure, unspecified: Secondary | ICD-10-CM | POA: Diagnosis not present

## 2015-10-04 DIAGNOSIS — J441 Chronic obstructive pulmonary disease with (acute) exacerbation: Secondary | ICD-10-CM | POA: Diagnosis not present

## 2015-10-04 DIAGNOSIS — I110 Hypertensive heart disease with heart failure: Secondary | ICD-10-CM | POA: Diagnosis not present

## 2015-10-04 DIAGNOSIS — J690 Pneumonitis due to inhalation of food and vomit: Secondary | ICD-10-CM | POA: Diagnosis not present

## 2015-10-04 DIAGNOSIS — J189 Pneumonia, unspecified organism: Secondary | ICD-10-CM | POA: Diagnosis not present

## 2015-10-06 DIAGNOSIS — J189 Pneumonia, unspecified organism: Secondary | ICD-10-CM | POA: Diagnosis not present

## 2015-10-06 DIAGNOSIS — J441 Chronic obstructive pulmonary disease with (acute) exacerbation: Secondary | ICD-10-CM | POA: Diagnosis not present

## 2015-10-06 DIAGNOSIS — I110 Hypertensive heart disease with heart failure: Secondary | ICD-10-CM | POA: Diagnosis not present

## 2015-10-06 DIAGNOSIS — I509 Heart failure, unspecified: Secondary | ICD-10-CM | POA: Diagnosis not present

## 2015-10-06 DIAGNOSIS — J690 Pneumonitis due to inhalation of food and vomit: Secondary | ICD-10-CM | POA: Diagnosis not present

## 2015-10-06 DIAGNOSIS — J9621 Acute and chronic respiratory failure with hypoxia: Secondary | ICD-10-CM | POA: Diagnosis not present

## 2015-10-11 DIAGNOSIS — I509 Heart failure, unspecified: Secondary | ICD-10-CM | POA: Diagnosis not present

## 2015-10-11 DIAGNOSIS — I110 Hypertensive heart disease with heart failure: Secondary | ICD-10-CM | POA: Diagnosis not present

## 2015-10-11 DIAGNOSIS — J441 Chronic obstructive pulmonary disease with (acute) exacerbation: Secondary | ICD-10-CM | POA: Diagnosis not present

## 2015-10-11 DIAGNOSIS — J189 Pneumonia, unspecified organism: Secondary | ICD-10-CM | POA: Diagnosis not present

## 2015-10-11 DIAGNOSIS — J9621 Acute and chronic respiratory failure with hypoxia: Secondary | ICD-10-CM | POA: Diagnosis not present

## 2015-10-11 DIAGNOSIS — J690 Pneumonitis due to inhalation of food and vomit: Secondary | ICD-10-CM | POA: Diagnosis not present

## 2015-10-13 DIAGNOSIS — I509 Heart failure, unspecified: Secondary | ICD-10-CM | POA: Diagnosis not present

## 2015-10-13 DIAGNOSIS — J441 Chronic obstructive pulmonary disease with (acute) exacerbation: Secondary | ICD-10-CM | POA: Diagnosis not present

## 2015-10-13 DIAGNOSIS — J690 Pneumonitis due to inhalation of food and vomit: Secondary | ICD-10-CM | POA: Diagnosis not present

## 2015-10-13 DIAGNOSIS — J9621 Acute and chronic respiratory failure with hypoxia: Secondary | ICD-10-CM | POA: Diagnosis not present

## 2015-10-13 DIAGNOSIS — I110 Hypertensive heart disease with heart failure: Secondary | ICD-10-CM | POA: Diagnosis not present

## 2015-10-13 DIAGNOSIS — J189 Pneumonia, unspecified organism: Secondary | ICD-10-CM | POA: Diagnosis not present

## 2015-10-19 DIAGNOSIS — I509 Heart failure, unspecified: Secondary | ICD-10-CM | POA: Diagnosis not present

## 2015-10-19 DIAGNOSIS — J9621 Acute and chronic respiratory failure with hypoxia: Secondary | ICD-10-CM | POA: Diagnosis not present

## 2015-10-19 DIAGNOSIS — J441 Chronic obstructive pulmonary disease with (acute) exacerbation: Secondary | ICD-10-CM | POA: Diagnosis not present

## 2015-10-19 DIAGNOSIS — J690 Pneumonitis due to inhalation of food and vomit: Secondary | ICD-10-CM | POA: Diagnosis not present

## 2015-10-19 DIAGNOSIS — J189 Pneumonia, unspecified organism: Secondary | ICD-10-CM | POA: Diagnosis not present

## 2015-10-19 DIAGNOSIS — I110 Hypertensive heart disease with heart failure: Secondary | ICD-10-CM | POA: Diagnosis not present

## 2015-10-22 DIAGNOSIS — J441 Chronic obstructive pulmonary disease with (acute) exacerbation: Secondary | ICD-10-CM | POA: Diagnosis not present

## 2015-10-22 DIAGNOSIS — J189 Pneumonia, unspecified organism: Secondary | ICD-10-CM | POA: Diagnosis not present

## 2015-10-22 DIAGNOSIS — E785 Hyperlipidemia, unspecified: Secondary | ICD-10-CM | POA: Diagnosis not present

## 2015-10-22 DIAGNOSIS — J690 Pneumonitis due to inhalation of food and vomit: Secondary | ICD-10-CM | POA: Diagnosis not present

## 2015-10-22 DIAGNOSIS — Z515 Encounter for palliative care: Secondary | ICD-10-CM | POA: Diagnosis not present

## 2015-10-22 DIAGNOSIS — I110 Hypertensive heart disease with heart failure: Secondary | ICD-10-CM | POA: Diagnosis not present

## 2015-10-22 DIAGNOSIS — I2510 Atherosclerotic heart disease of native coronary artery without angina pectoris: Secondary | ICD-10-CM | POA: Diagnosis not present

## 2015-10-22 DIAGNOSIS — I4891 Unspecified atrial fibrillation: Secondary | ICD-10-CM | POA: Diagnosis not present

## 2015-10-22 DIAGNOSIS — I509 Heart failure, unspecified: Secondary | ICD-10-CM | POA: Diagnosis not present

## 2015-10-22 DIAGNOSIS — Z950 Presence of cardiac pacemaker: Secondary | ICD-10-CM | POA: Diagnosis not present

## 2015-10-22 DIAGNOSIS — J9621 Acute and chronic respiratory failure with hypoxia: Secondary | ICD-10-CM | POA: Diagnosis not present

## 2015-10-22 DIAGNOSIS — Z9981 Dependence on supplemental oxygen: Secondary | ICD-10-CM | POA: Diagnosis not present

## 2015-10-24 DIAGNOSIS — I872 Venous insufficiency (chronic) (peripheral): Secondary | ICD-10-CM | POA: Diagnosis not present

## 2015-10-24 DIAGNOSIS — L97222 Non-pressure chronic ulcer of left calf with fat layer exposed: Secondary | ICD-10-CM | POA: Diagnosis not present

## 2015-10-24 DIAGNOSIS — L97229 Non-pressure chronic ulcer of left calf with unspecified severity: Secondary | ICD-10-CM | POA: Diagnosis not present

## 2015-10-24 DIAGNOSIS — L97822 Non-pressure chronic ulcer of other part of left lower leg with fat layer exposed: Secondary | ICD-10-CM | POA: Diagnosis not present

## 2015-10-24 DIAGNOSIS — I83028 Varicose veins of left lower extremity with ulcer other part of lower leg: Secondary | ICD-10-CM | POA: Diagnosis not present

## 2015-10-26 DIAGNOSIS — J189 Pneumonia, unspecified organism: Secondary | ICD-10-CM | POA: Diagnosis not present

## 2015-10-26 DIAGNOSIS — I509 Heart failure, unspecified: Secondary | ICD-10-CM | POA: Diagnosis not present

## 2015-10-26 DIAGNOSIS — I110 Hypertensive heart disease with heart failure: Secondary | ICD-10-CM | POA: Diagnosis not present

## 2015-10-26 DIAGNOSIS — J690 Pneumonitis due to inhalation of food and vomit: Secondary | ICD-10-CM | POA: Diagnosis not present

## 2015-10-26 DIAGNOSIS — J441 Chronic obstructive pulmonary disease with (acute) exacerbation: Secondary | ICD-10-CM | POA: Diagnosis not present

## 2015-10-26 DIAGNOSIS — J9621 Acute and chronic respiratory failure with hypoxia: Secondary | ICD-10-CM | POA: Diagnosis not present

## 2015-10-27 DIAGNOSIS — J189 Pneumonia, unspecified organism: Secondary | ICD-10-CM | POA: Diagnosis not present

## 2015-10-27 DIAGNOSIS — J690 Pneumonitis due to inhalation of food and vomit: Secondary | ICD-10-CM | POA: Diagnosis not present

## 2015-10-27 DIAGNOSIS — J9621 Acute and chronic respiratory failure with hypoxia: Secondary | ICD-10-CM | POA: Diagnosis not present

## 2015-10-27 DIAGNOSIS — I110 Hypertensive heart disease with heart failure: Secondary | ICD-10-CM | POA: Diagnosis not present

## 2015-10-27 DIAGNOSIS — I509 Heart failure, unspecified: Secondary | ICD-10-CM | POA: Diagnosis not present

## 2015-10-27 DIAGNOSIS — J441 Chronic obstructive pulmonary disease with (acute) exacerbation: Secondary | ICD-10-CM | POA: Diagnosis not present

## 2015-11-01 DIAGNOSIS — I509 Heart failure, unspecified: Secondary | ICD-10-CM | POA: Diagnosis not present

## 2015-11-01 DIAGNOSIS — J441 Chronic obstructive pulmonary disease with (acute) exacerbation: Secondary | ICD-10-CM | POA: Diagnosis not present

## 2015-11-01 DIAGNOSIS — J690 Pneumonitis due to inhalation of food and vomit: Secondary | ICD-10-CM | POA: Diagnosis not present

## 2015-11-01 DIAGNOSIS — M160 Bilateral primary osteoarthritis of hip: Secondary | ICD-10-CM | POA: Diagnosis not present

## 2015-11-01 DIAGNOSIS — J189 Pneumonia, unspecified organism: Secondary | ICD-10-CM | POA: Diagnosis not present

## 2015-11-01 DIAGNOSIS — J9621 Acute and chronic respiratory failure with hypoxia: Secondary | ICD-10-CM | POA: Diagnosis not present

## 2015-11-01 DIAGNOSIS — I110 Hypertensive heart disease with heart failure: Secondary | ICD-10-CM | POA: Diagnosis not present

## 2015-11-01 DIAGNOSIS — Z23 Encounter for immunization: Secondary | ICD-10-CM | POA: Diagnosis not present

## 2015-11-01 DIAGNOSIS — Z959 Presence of cardiac and vascular implant and graft, unspecified: Secondary | ICD-10-CM | POA: Diagnosis not present

## 2015-11-01 DIAGNOSIS — I2510 Atherosclerotic heart disease of native coronary artery without angina pectoris: Secondary | ICD-10-CM | POA: Diagnosis not present

## 2015-11-01 DIAGNOSIS — I10 Essential (primary) hypertension: Secondary | ICD-10-CM | POA: Diagnosis not present

## 2015-11-03 DIAGNOSIS — I110 Hypertensive heart disease with heart failure: Secondary | ICD-10-CM | POA: Diagnosis not present

## 2015-11-03 DIAGNOSIS — J189 Pneumonia, unspecified organism: Secondary | ICD-10-CM | POA: Diagnosis not present

## 2015-11-03 DIAGNOSIS — J690 Pneumonitis due to inhalation of food and vomit: Secondary | ICD-10-CM | POA: Diagnosis not present

## 2015-11-03 DIAGNOSIS — I509 Heart failure, unspecified: Secondary | ICD-10-CM | POA: Diagnosis not present

## 2015-11-03 DIAGNOSIS — J441 Chronic obstructive pulmonary disease with (acute) exacerbation: Secondary | ICD-10-CM | POA: Diagnosis not present

## 2015-11-03 DIAGNOSIS — J9621 Acute and chronic respiratory failure with hypoxia: Secondary | ICD-10-CM | POA: Diagnosis not present

## 2015-12-20 ENCOUNTER — Encounter: Payer: Self-pay | Admitting: Internal Medicine

## 2015-12-20 DIAGNOSIS — I442 Atrioventricular block, complete: Secondary | ICD-10-CM | POA: Diagnosis not present

## 2015-12-20 DIAGNOSIS — Z950 Presence of cardiac pacemaker: Secondary | ICD-10-CM | POA: Diagnosis not present

## 2015-12-28 ENCOUNTER — Ambulatory Visit (INDEPENDENT_AMBULATORY_CARE_PROVIDER_SITE_OTHER): Payer: Medicare Other | Admitting: *Deleted

## 2015-12-28 DIAGNOSIS — Z950 Presence of cardiac pacemaker: Secondary | ICD-10-CM | POA: Diagnosis not present

## 2015-12-28 DIAGNOSIS — I442 Atrioventricular block, complete: Secondary | ICD-10-CM

## 2015-12-28 LAB — CUP PACEART INCLINIC DEVICE CHECK
Battery Impedance: 4001 Ohm
Battery Remaining Longevity: 10 mo
Battery Voltage: 2.7 V
Brady Statistic AP VP Percent: 75.9 %
Brady Statistic AP VS Percent: 0.6 %
Brady Statistic AS VP Percent: 22.7 %
Brady Statistic AS VS Percent: 0.8 %
Date Time Interrogation Session: 20171207162400
Implantable Lead Implant Date: 20060926
Implantable Lead Implant Date: 20060926
Implantable Lead Location: 753859
Implantable Lead Location: 753860
Implantable Lead Model: 5076
Implantable Lead Model: 5076
Implantable Pulse Generator Implant Date: 20060926
Lead Channel Impedance Value: 415 Ohm
Lead Channel Impedance Value: 451 Ohm
Lead Channel Pacing Threshold Amplitude: 1 V
Lead Channel Pacing Threshold Amplitude: 1 V
Lead Channel Pacing Threshold Pulse Width: 0.4 ms
Lead Channel Pacing Threshold Pulse Width: 0.6 ms
Lead Channel Sensing Intrinsic Amplitude: 0.7 mV
Lead Channel Sensing Intrinsic Amplitude: 11.2 mV
Lead Channel Setting Pacing Amplitude: 2 V
Lead Channel Setting Pacing Amplitude: 2.5 V
Lead Channel Setting Pacing Pulse Width: 0.6 ms
Lead Channel Setting Sensing Sensitivity: 2.8 mV

## 2015-12-28 NOTE — Progress Notes (Signed)
Pacemaker check in clinic. Normal device function. Thresholds, sensing, impedances consistent with previous measurements. Device programmed to maximize longevity. 1 mode switch episode (0% burden), no EGMs, duration 12 sec. No high ventricular rates noted. 572,130 PVC singles since 07/31/15, +amiodarone. Device programmed at appropriate safety margins. Histogram distribution appropriate for patient activity level. Device programmed to optimize intrinsic conduction. Estimated longevity 10 months (<1-19 months). ROV with GT/R on 02/05/16 as scheduled.

## 2016-01-09 DIAGNOSIS — I10 Essential (primary) hypertension: Secondary | ICD-10-CM | POA: Diagnosis not present

## 2016-01-09 DIAGNOSIS — Z959 Presence of cardiac and vascular implant and graft, unspecified: Secondary | ICD-10-CM | POA: Diagnosis not present

## 2016-01-09 DIAGNOSIS — I5022 Chronic systolic (congestive) heart failure: Secondary | ICD-10-CM | POA: Diagnosis not present

## 2016-01-09 DIAGNOSIS — I2510 Atherosclerotic heart disease of native coronary artery without angina pectoris: Secondary | ICD-10-CM | POA: Diagnosis not present

## 2016-01-10 DIAGNOSIS — I2510 Atherosclerotic heart disease of native coronary artery without angina pectoris: Secondary | ICD-10-CM | POA: Diagnosis not present

## 2016-01-10 DIAGNOSIS — I10 Essential (primary) hypertension: Secondary | ICD-10-CM | POA: Diagnosis not present

## 2016-01-10 DIAGNOSIS — D649 Anemia, unspecified: Secondary | ICD-10-CM | POA: Diagnosis not present

## 2016-01-27 ENCOUNTER — Emergency Department (HOSPITAL_COMMUNITY): Payer: Medicare Other

## 2016-01-27 ENCOUNTER — Encounter (HOSPITAL_COMMUNITY): Payer: Self-pay | Admitting: *Deleted

## 2016-01-27 ENCOUNTER — Emergency Department (HOSPITAL_COMMUNITY)
Admission: EM | Admit: 2016-01-27 | Discharge: 2016-01-28 | Disposition: A | Discharge status: Home / Self Care | Payer: Medicare Other | Attending: Emergency Medicine | Admitting: Emergency Medicine

## 2016-01-27 DIAGNOSIS — R011 Cardiac murmur, unspecified: Secondary | ICD-10-CM | POA: Diagnosis not present

## 2016-01-27 DIAGNOSIS — Z950 Presence of cardiac pacemaker: Secondary | ICD-10-CM | POA: Insufficient documentation

## 2016-01-27 DIAGNOSIS — I509 Heart failure, unspecified: Secondary | ICD-10-CM | POA: Diagnosis not present

## 2016-01-27 DIAGNOSIS — R197 Diarrhea, unspecified: Secondary | ICD-10-CM | POA: Diagnosis not present

## 2016-01-27 DIAGNOSIS — R112 Nausea with vomiting, unspecified: Secondary | ICD-10-CM | POA: Diagnosis present

## 2016-01-27 DIAGNOSIS — I2510 Atherosclerotic heart disease of native coronary artery without angina pectoris: Secondary | ICD-10-CM | POA: Insufficient documentation

## 2016-01-27 DIAGNOSIS — I110 Hypertensive heart disease with heart failure: Secondary | ICD-10-CM | POA: Diagnosis not present

## 2016-01-27 DIAGNOSIS — Z79899 Other long term (current) drug therapy: Secondary | ICD-10-CM | POA: Diagnosis not present

## 2016-01-27 DIAGNOSIS — E860 Dehydration: Secondary | ICD-10-CM

## 2016-01-27 DIAGNOSIS — R109 Unspecified abdominal pain: Secondary | ICD-10-CM | POA: Diagnosis not present

## 2016-01-27 LAB — COMPREHENSIVE METABOLIC PANEL
ALK PHOS: 69 U/L (ref 38–126)
ALT: 36 U/L (ref 14–54)
ANION GAP: 10 (ref 5–15)
AST: 44 U/L — AB (ref 15–41)
Albumin: 4.1 g/dL (ref 3.5–5.0)
BILIRUBIN TOTAL: 1.3 mg/dL — AB (ref 0.3–1.2)
BUN: 16 mg/dL (ref 6–20)
CALCIUM: 9.3 mg/dL (ref 8.9–10.3)
CO2: 29 mmol/L (ref 22–32)
Chloride: 104 mmol/L (ref 101–111)
Creatinine, Ser: 1 mg/dL (ref 0.44–1.00)
GFR calc Af Amer: 55 mL/min — ABNORMAL LOW (ref >60)
GFR, EST NON AFRICAN AMERICAN: 47 mL/min — AB (ref >60)
GLUCOSE: 134 mg/dL — AB (ref 65–99)
POTASSIUM: 3.4 mmol/L — AB (ref 3.5–5.1)
Sodium: 143 mmol/L (ref 135–145)
TOTAL PROTEIN: 7.5 g/dL (ref 6.5–8.1)

## 2016-01-27 LAB — CBC WITH DIFFERENTIAL/PLATELET
Basophils Absolute: 0 10*3/uL (ref 0.0–0.1)
Basophils Relative: 0 %
Eosinophils Absolute: 0 10*3/uL (ref 0.0–0.7)
Eosinophils Relative: 0 %
HCT: 40.2 % (ref 36.0–46.0)
HEMOGLOBIN: 13.2 g/dL (ref 12.0–15.0)
Lymphocytes Relative: 4 %
Lymphs Abs: 0.2 10*3/uL — ABNORMAL LOW (ref 0.7–4.0)
MCH: 31.8 pg (ref 26.0–34.0)
MCHC: 32.8 g/dL (ref 30.0–36.0)
MCV: 96.9 fL (ref 78.0–100.0)
MONO ABS: 0.2 10*3/uL (ref 0.1–1.0)
MONOS PCT: 3 %
Neutro Abs: 4.7 10*3/uL (ref 1.7–7.7)
Neutrophils Relative %: 93 %
Platelets: 125 10*3/uL — ABNORMAL LOW (ref 150–400)
RBC: 4.15 MIL/uL (ref 3.87–5.11)
RDW: 14.5 % (ref 11.5–15.5)
WBC: 5.1 10*3/uL (ref 4.0–10.5)

## 2016-01-27 MED ORDER — ONDANSETRON HCL 4 MG/2ML IJ SOLN
4.0000 mg | Freq: Once | INTRAMUSCULAR | Status: AC
  Administered 2016-01-27: 4 mg via INTRAVENOUS
  Filled 2016-01-27: qty 2

## 2016-01-27 MED ORDER — SODIUM CHLORIDE 0.9 % IV BOLUS (SEPSIS)
1000.0000 mL | Freq: Once | INTRAVENOUS | Status: AC
  Administered 2016-01-27: 1000 mL via INTRAVENOUS

## 2016-01-27 MED ORDER — ONDANSETRON 4 MG PO TBDP: ORAL_TABLET | ORAL | 0 refills | Status: DC

## 2016-01-27 NOTE — ED Triage Notes (Addendum)
Daughter states that the pt has had 5 episodes of vomiting and 2 episodes of diarrhea since breakfast time this morning. Pt was unable to keeps her meds down today. Daughter states pt has not had a fever today but is unable to keep water down.

## 2016-01-27 NOTE — Discharge Instructions (Signed)
Drink plenty of fluids and follow-up with your doctor this week for recheck °

## 2016-01-27 NOTE — ED Notes (Signed)
Pt c/o nv/d that started today, denies any fever, unsure of any recent sick contacts,

## 2016-01-27 NOTE — ED Provider Notes (Signed)
[facility] Provider Note   CSN: [id number] Arrival date & time: 01/27/16  2002   By signing my name below, I, [name], attest that this documentation has been prepared under the direction and in the presence of Bethann Berkshire, MD. Electronically signed, [name], ED Scribe. 01/27/16. 9:50 PM.   History   Chief Complaint Chief Complaint  Patient presents with  . Emesis    The history is provided by the patient, a relative and medical records.  Emesis   This is a new problem. The current episode started 12 to 24 hours ago. The problem occurs 5 to 10 times per day. The problem has not changed since onset.The emesis has an appearance of bilious material. There has been no fever. Associated symptoms include abdominal pain and diarrhea. Pertinent negatives include no chills, no cough, no fever and no headaches.    HPI Comments: [name] is a 83 y.o. female who presents to the Emergency Department complaining of N/V/D beginning this morning. States she has diarrhea and vomiting every time she takes her medications, and her daughter notes pt has had diarrhea on 2-3 occasions. Reports associated RUQ abdominal pain that has resolved on evaluation. Daughter reveals dark green color to vomit, and the pt states she last drank juice this morning. Pt denies bloody vomit and fever, per triage.  Past Medical History:  Diagnosis Date  . Anemia   . Anxiety   . Atrial fibrillation (HCC)   . DJD (degenerative joint disease)   . Hypertension   . Osteoporosis   . Seasonal allergies   . Sleep apnea     Patient Active Problem List   Diagnosis Date Noted  . Malnutrition of moderate degree 06/03/2015  . Hospital acquired PNA 05/23/2015  . HCAP (healthcare-associated pneumonia) 05/23/2015  . Aspiration pneumonia of both lower lobes (HCC)   . Palliative care encounter   . DNR (do not resuscitate) discussion   . Acute GI bleeding   . Severe anemia 05/14/2015  . Melena  05/14/2015  . Weakness generalized 05/14/2015  . GI bleed 05/14/2015  . Atrial fibrillation (HCC) 04/26/2015  . Severe tricuspid regurgitation 03/15/2015  . CHF (congestive heart failure) (HCC) 03/08/2015  . Systolic murmur 03/08/2015  . CAD (coronary artery disease) 05/25/2014  . DYSLIPIDEMIA 03/14/2010  . Essential hypertension, benign 03/20/2009  . BRADYCARDIA 03/20/2009  . Cardiac pacemaker in situ 03/20/2009    Past Surgical History:  Procedure Laterality Date  . CHOLECYSTECTOMY    . ESOPHAGOGASTRODUODENOSCOPY N/A 05/16/2015   Procedure: ESOPHAGOGASTRODUODENOSCOPY (EGD);  Surgeon: West Bali, MD;  Location: [facility];  Service: Endoscopy;  Laterality: N/A;  . HEMORRHOID SURGERY    . PACEMAKER INSERTION    . TOTAL HIP ARTHROPLASTY      OB History    No data available       Home Medications    Prior to Admission medications   Medication Sig Start Date End Date Taking? Authorizing Provider  acetaminophen (TYLENOL) 650 MG CR tablet Take 650 mg by mouth every 8 (eight) hours as needed for pain.    Yes Historical Provider, MD  amiodarone (PACERONE) 200 MG tablet Take 1 tablet (200 mg total) by mouth daily. 05/01/15  Yes Marinus Maw, MD  amLODipine (NORVASC) 5 MG tablet Take 1 tablet (5 mg total) by mouth daily. 05/25/14  Yes Laqueta Linden, MD  ferrous sulfate 325 (65 FE) MG EC tablet Take 65 mg by mouth 2 (two) times daily with a  meal.    Yes Historical Provider, MD  furosemide (LASIX) 40 MG tablet Take 1 tablet (40 mg total) by mouth daily. 03/08/15  Yes Dyann Kief, PA-C  lisinopril (PRINIVIL,ZESTRIL) 40 MG tablet Take 1 tablet (40 mg total) by mouth daily. 04/08/12  Yes Marinus Maw, MD  pantoprazole (PROTONIX) 40 MG tablet Take 1 tablet (40 mg total) by mouth 2 (two) times daily before a meal. 05/19/15  Yes Avon Gully, MD  potassium chloride SA (K-DUR,KLOR-CON) 20 MEQ tablet Take 1 tablet (20 mEq total) by mouth daily. 03/08/15  Yes Dyann Kief, PA-C      Family History Family History  Problem Relation Age of Onset  . Colon cancer Father   . Colon cancer Paternal Aunt   . Crohn's disease Son     Social History Social History  Substance Use Topics  . Smoking status: Never Smoker  . Smokeless tobacco: Never Used  . Alcohol use No     Allergies   Patient has no known allergies.   Review of Systems Review of Systems  Constitutional: Negative for appetite change, chills, fatigue and fever.  HENT: Negative for congestion, ear discharge and sinus pressure.   Eyes: Negative for discharge.  Respiratory: Negative for cough.   Cardiovascular: Negative for chest pain.  Gastrointestinal: Positive for abdominal pain, diarrhea, nausea and vomiting.  Genitourinary: Negative for frequency and hematuria.  Musculoskeletal: Negative for back pain.  Skin: Negative for rash.  Neurological: Negative for seizures and headaches.  Psychiatric/Behavioral: Negative for hallucinations.     Physical Exam Updated Vital Signs BP 164/67 (BP Location: Left Arm)   Pulse 67   Temp 98.2 F (36.8 C) (Tympanic)   Resp 20   Ht 5\' 3"  (1.6 m)   Wt 149 lb (67.6 kg)   SpO2 100%   BMI 26.39 kg/m   Physical Exam  Constitutional: She is oriented to person, place, and time. She appears well-developed.  HENT:  Head: Normocephalic.  Mouth/Throat: Mucous membranes are dry.  Eyes: Conjunctivae and EOM are normal. No scleral icterus.  Neck: Neck supple. No thyromegaly present.  Cardiovascular: Exam reveals no gallop and no friction rub.   Murmur (3/6 holosystolic) heard.  Systolic murmur is present with a grade of 3/6  Pulmonary/Chest: No stridor. She has no wheezes. She has no rales. She exhibits no tenderness.  Abdominal: She exhibits no distension. There is no tenderness. There is no rebound.  Musculoskeletal: Normal range of motion. She exhibits no edema.  Lymphadenopathy:    She has no cervical adenopathy.  Neurological: She is oriented to  person, place, and time. She exhibits normal muscle tone. Coordination normal.  Skin: No rash noted. No erythema.  Psychiatric: She has a normal mood and affect. Her behavior is normal.    ED Treatments / Results  DIAGNOSTIC STUDIES: Oxygen Saturation is 100% on RA, normal by my interpretation.    COORDINATION OF CARE: 9:50 PM Discussed treatment plan with pt at bedside and pt agreed to plan.  Labs (all labs ordered are listed, but only abnormal results are displayed) Labs Reviewed  CBC WITH DIFFERENTIAL/PLATELET - Abnormal; Notable for the following:       Result Value   Platelets 125 (*)    Lymphs Abs 0.2 (*)    All other components within normal limits  COMPREHENSIVE METABOLIC PANEL - Abnormal; Notable for the following:    Potassium 3.4 (*)    Glucose, Bld 134 (*)    AST 44 (*)  Total Bilirubin 1.3 (*)    GFR calc non Af Amer 47 (*)    GFR calc Af Amer 55 (*)    All other components within normal limits    EKG  EKG Interpretation None       Radiology No results found.  Procedures Procedures (including critical care time)  Medications Ordered in ED Medications  ondansetron (ZOFRAN) injection 4 mg (not administered)  sodium chloride 0.9 % bolus 1,000 mL (1,000 mLs Intravenous New Bag/Given 01/27/16 2143)     Initial Impression / Assessment and Plan / ED Course  I have reviewed the triage vital signs and the nursing notes.  Pertinent labs & imaging results that were available during my care of the patient were reviewed by me and considered in my medical decision making (see chart for details).  Clinical Course     Gastroenteritis. Patient improved with fluids. She The chart was scribed for me under my direct supervision.  I personally performed the history, physical, and medical decision making and all procedures in the evaluation of this patient..  will follow-up with her PCP  Final Clinical Impressions(s) / ED Diagnoses   Final diagnoses:  None     New Prescriptions New Prescriptions   No medications on file     Bethann Berkshire, MD 02/03/16 0945

## 2016-02-05 ENCOUNTER — Encounter: Payer: Self-pay | Admitting: Internal Medicine

## 2016-02-05 ENCOUNTER — Ambulatory Visit (INDEPENDENT_AMBULATORY_CARE_PROVIDER_SITE_OTHER): Payer: Medicare Other | Admitting: Internal Medicine

## 2016-02-05 VITALS — BP 122/58 | HR 62 | Ht 63.0 in | Wt 139.0 lb

## 2016-02-05 DIAGNOSIS — I480 Paroxysmal atrial fibrillation: Secondary | ICD-10-CM | POA: Diagnosis not present

## 2016-02-05 DIAGNOSIS — R531 Weakness: Secondary | ICD-10-CM

## 2016-02-05 LAB — CUP PACEART INCLINIC DEVICE CHECK
Battery Impedance: 4410 Ohm
Implantable Lead Implant Date: 20060926
Implantable Lead Implant Date: 20060926
Implantable Lead Location: 753859
Implantable Lead Model: 5076
Implantable Pulse Generator Implant Date: 20060926
Lead Channel Impedance Value: 438 Ohm
Lead Channel Pacing Threshold Pulse Width: 0.4 ms
Lead Channel Pacing Threshold Pulse Width: 0.6 ms
Lead Channel Sensing Intrinsic Amplitude: 1.4 mV
Lead Channel Sensing Intrinsic Amplitude: 8 mV
Lead Channel Setting Pacing Amplitude: 2 V
MDC IDC LEAD LOCATION: 753860
MDC IDC MSMT BATTERY REMAINING LONGEVITY: 8 mo
MDC IDC MSMT BATTERY VOLTAGE: 2.69 V
MDC IDC MSMT LEADCHNL RA IMPEDANCE VALUE: 481 Ohm
MDC IDC MSMT LEADCHNL RA PACING THRESHOLD AMPLITUDE: 1 V
MDC IDC MSMT LEADCHNL RV PACING THRESHOLD AMPLITUDE: 1 V
MDC IDC SESS DTM: 20180115150145
MDC IDC SET LEADCHNL RV PACING AMPLITUDE: 2.5 V
MDC IDC SET LEADCHNL RV PACING PULSEWIDTH: 0.6 ms
MDC IDC SET LEADCHNL RV SENSING SENSITIVITY: 2.8 mV

## 2016-02-05 MED ORDER — FUROSEMIDE 40 MG PO TABS: 40.0000 mg | ORAL_TABLET | Freq: Every day | ORAL | 3 refills | Status: DC

## 2016-02-05 NOTE — Patient Instructions (Addendum)
Your physician wants you to follow-up in: 1 Year with Dr. [name]. You will receive a reminder letter in the mail two months in advance. If you don't receive a letter, please call our office to schedule the follow-up appointment.  Your physician recommends that you schedule a follow-up appointment in: 2 Months with Device Clinic   You have been referred to Physical Therapy   Your physician recommends that you continue on your current medications as directed. Please refer to the Current Medication list given to you today.  If you need a refill on your cardiac medications before your next appointment, please call your pharmacy.  Thank you for choosing [facility]!

## 2016-02-05 NOTE — Progress Notes (Signed)
HPI [name] returns today for followup. She is a pleasant 54 woman with a h/o CHB, s/p PPM insertion, HTN, and dyslipidemia. In the interim, she has developed sob and PAF, but no syncope. She has edema. She was seen in our office and placed on a beta blocker and Xarelto. She developed anemia and was found to be bleeding and her Xarelto has been stopped.    No Known Allergies   Current Outpatient Prescriptions  Medication Sig Dispense Refill  . acetaminophen (TYLENOL) 650 MG CR tablet Take 650 mg by mouth every 8 (eight) hours as needed for pain.     Marland Kitchen amiodarone (PACERONE) 200 MG tablet Take 1 tablet (200 mg total) by mouth daily. 90 tablet 3  . amLODipine (NORVASC) 5 MG tablet Take 1 tablet (5 mg total) by mouth daily. 90 tablet 3  . ferrous sulfate 325 (65 FE) MG EC tablet Take 65 mg by mouth 2 (two) times daily with a meal.     . furosemide (LASIX) 40 MG tablet Take 1 tablet (40 mg total) by mouth daily. 90 tablet 3  . lisinopril (PRINIVIL,ZESTRIL) 40 MG tablet Take 1 tablet (40 mg total) by mouth daily. 90 tablet 0  . pantoprazole (PROTONIX) 40 MG tablet Take 1 tablet (40 mg total) by mouth 2 (two) times daily before a meal. 60 tablet 3  . potassium chloride SA (K-DUR,KLOR-CON) 20 MEQ tablet Take 1 tablet (20 mEq total) by mouth daily. 90 tablet 3   No current facility-administered medications for this visit.      Past Medical History:  Diagnosis Date  . Anemia   . Anxiety   . Atrial fibrillation (HCC)   . DJD (degenerative joint disease)   . Hypertension   . Osteoporosis   . Seasonal allergies   . Sleep apnea     ROS:   All systems reviewed and negative except as noted in the HPI.   Past Surgical History:  Procedure Laterality Date  . CHOLECYSTECTOMY    . ESOPHAGOGASTRODUODENOSCOPY N/A 05/16/2015   Procedure: ESOPHAGOGASTRODUODENOSCOPY (EGD);  Surgeon: West Bali, MD;  Location: [facility];  Service: Endoscopy;  Laterality: N/A;  . HEMORRHOID SURGERY    .  PACEMAKER INSERTION    . TOTAL HIP ARTHROPLASTY       Family History  Problem Relation Age of Onset  . Colon cancer Father   . Colon cancer Paternal Aunt   . Crohn's disease Son      Social History   Social History  . Marital status: Widowed    Spouse name: N/A  . Number of children: N/A  . Years of education: N/A   Occupational History  . Not on file.   Social History Main Topics  . Smoking status: Never Smoker  . Smokeless tobacco: Never Used  . Alcohol use No  . Drug use: No  . Sexual activity: Not on file   Other Topics Concern  . Not on file   Social History Narrative   Widowed   Daughter     BP (!) 122/58   Pulse 62   Ht 5\' 3"  (1.6 m)   Wt 139 lb (63 kg)   SpO2 95%   BMI 24.62 kg/m   Physical Exam:  Well appearing elderly woman, who looks younger than her stated age and in NAD HEENT: Unremarkable Neck:  6 cm JVD, no thyromegally Back:  No CVA tenderness Lungs:  Clear with no wheezes, rales, or rhonchi. Well healed PPM insertion. HEART:  Regular rate rhythm, no murmurs, no rubs, no clicks Abd:  soft, positive bowel sounds, no organomegally, no rebound, no guarding Ext:  2 plus pulses, no edema, no cyanosis, no clubbing Skin:  No rashes no nodules Neuro:  CN II through XII intact, motor grossly intact   DEVICE  Normal device function.  See PaceArt for details.   Assess/Plan: 1. PAF - she is in rhythm today. She will continue amiodarone. She is not a candidate for additional anti-coagulation due to GI bleeding 2. PVC's - these have improved 3. HTN  - her blood pressure is reasonably well controlled. Will follow. 4. Chronic diastolic heart failure - she has improved. She will continue her current meds.   [name]

## 2016-02-16 ENCOUNTER — Other Ambulatory Visit: Payer: Self-pay | Admitting: Physician Assistant

## 2016-03-11 ENCOUNTER — Telehealth: Payer: Self-pay | Admitting: Internal Medicine

## 2016-03-11 NOTE — Telephone Encounter (Signed)
Please give [name] a call regarding pt's physical therapy

## 2016-03-11 NOTE — Telephone Encounter (Signed)
Daughter , [name], states she told PT in January that mother needs in home PT and was waiting for that.I don't see any home health referrals .Daughter states Dr [name] ordered PT because patient was weak.I told Ramon Dredge I will speak with Dr Lubertha Basque nurse Bradly Bienenstock, and I would check with Raechel Ache regarding referral and call her back tomorrow

## 2016-03-13 NOTE — Telephone Encounter (Signed)
Spoke with Daughter [name] who states that she will talk with the outpatient patient PT regarding treatment for her mother.

## 2016-03-28 ENCOUNTER — Ambulatory Visit (INDEPENDENT_AMBULATORY_CARE_PROVIDER_SITE_OTHER): Payer: Medicare Other | Admitting: *Deleted

## 2016-03-28 DIAGNOSIS — I442 Atrioventricular block, complete: Secondary | ICD-10-CM

## 2016-03-28 LAB — CUP PACEART INCLINIC DEVICE CHECK
Battery Impedance: 5036 Ohm
Battery Remaining Longevity: 6 mo
Brady Statistic AP VP Percent: 86.8 %
Date Time Interrogation Session: 20180308112419
Implantable Lead Implant Date: 20060926
Implantable Lead Location: 753859
Implantable Lead Model: 5076
Implantable Lead Model: 5076
Lead Channel Impedance Value: 417 Ohm
Lead Channel Setting Pacing Amplitude: 2 V
Lead Channel Setting Pacing Pulse Width: 0.6 ms
MDC IDC LEAD IMPLANT DT: 20060926
MDC IDC LEAD LOCATION: 753860
MDC IDC MSMT BATTERY VOLTAGE: 2.66 V
MDC IDC MSMT LEADCHNL RA IMPEDANCE VALUE: 451 Ohm
MDC IDC PG IMPLANT DT: 20060926
MDC IDC SET LEADCHNL RV PACING AMPLITUDE: 2.5 V
MDC IDC SET LEADCHNL RV SENSING SENSITIVITY: 2.8 mV
MDC IDC STAT BRADY AP VS PERCENT: 0.8 %
MDC IDC STAT BRADY AS VP PERCENT: 12.1 %
MDC IDC STAT BRADY AS VS PERCENT: 0.3 %

## 2016-03-28 NOTE — Progress Notes (Signed)
Battery check only (N/C). Battery estimate 6 months (range: <1-13 months). No episodes recorded. No tests performed. No changes made this session. Patient will follow up with the Device clinic in 1 month.

## 2016-03-29 DIAGNOSIS — F419 Anxiety disorder, unspecified: Secondary | ICD-10-CM | POA: Diagnosis not present

## 2016-03-29 DIAGNOSIS — I110 Hypertensive heart disease with heart failure: Secondary | ICD-10-CM | POA: Diagnosis not present

## 2016-03-29 DIAGNOSIS — I509 Heart failure, unspecified: Secondary | ICD-10-CM | POA: Diagnosis not present

## 2016-03-29 DIAGNOSIS — I480 Paroxysmal atrial fibrillation: Secondary | ICD-10-CM | POA: Diagnosis not present

## 2016-03-29 DIAGNOSIS — Z9181 History of falling: Secondary | ICD-10-CM | POA: Diagnosis not present

## 2016-03-29 DIAGNOSIS — M1990 Unspecified osteoarthritis, unspecified site: Secondary | ICD-10-CM | POA: Diagnosis not present

## 2016-03-29 DIAGNOSIS — R531 Weakness: Secondary | ICD-10-CM | POA: Diagnosis not present

## 2016-03-29 DIAGNOSIS — D649 Anemia, unspecified: Secondary | ICD-10-CM | POA: Diagnosis not present

## 2016-03-29 DIAGNOSIS — M810 Age-related osteoporosis without current pathological fracture: Secondary | ICD-10-CM | POA: Diagnosis not present

## 2016-03-29 DIAGNOSIS — G4730 Sleep apnea, unspecified: Secondary | ICD-10-CM | POA: Diagnosis not present

## 2016-03-29 DIAGNOSIS — Z950 Presence of cardiac pacemaker: Secondary | ICD-10-CM | POA: Diagnosis not present

## 2016-04-03 DIAGNOSIS — R531 Weakness: Secondary | ICD-10-CM | POA: Diagnosis not present

## 2016-04-03 DIAGNOSIS — I509 Heart failure, unspecified: Secondary | ICD-10-CM | POA: Diagnosis not present

## 2016-04-03 DIAGNOSIS — I110 Hypertensive heart disease with heart failure: Secondary | ICD-10-CM | POA: Diagnosis not present

## 2016-04-03 DIAGNOSIS — M810 Age-related osteoporosis without current pathological fracture: Secondary | ICD-10-CM | POA: Diagnosis not present

## 2016-04-03 DIAGNOSIS — I480 Paroxysmal atrial fibrillation: Secondary | ICD-10-CM | POA: Diagnosis not present

## 2016-04-03 DIAGNOSIS — M1990 Unspecified osteoarthritis, unspecified site: Secondary | ICD-10-CM | POA: Diagnosis not present

## 2016-04-04 DIAGNOSIS — M1990 Unspecified osteoarthritis, unspecified site: Secondary | ICD-10-CM | POA: Diagnosis not present

## 2016-04-04 DIAGNOSIS — I110 Hypertensive heart disease with heart failure: Secondary | ICD-10-CM | POA: Diagnosis not present

## 2016-04-04 DIAGNOSIS — I509 Heart failure, unspecified: Secondary | ICD-10-CM | POA: Diagnosis not present

## 2016-04-04 DIAGNOSIS — R531 Weakness: Secondary | ICD-10-CM | POA: Diagnosis not present

## 2016-04-04 DIAGNOSIS — I480 Paroxysmal atrial fibrillation: Secondary | ICD-10-CM | POA: Diagnosis not present

## 2016-04-04 DIAGNOSIS — M810 Age-related osteoporosis without current pathological fracture: Secondary | ICD-10-CM | POA: Diagnosis not present

## 2016-04-05 DIAGNOSIS — M1990 Unspecified osteoarthritis, unspecified site: Secondary | ICD-10-CM | POA: Diagnosis not present

## 2016-04-05 DIAGNOSIS — I110 Hypertensive heart disease with heart failure: Secondary | ICD-10-CM | POA: Diagnosis not present

## 2016-04-05 DIAGNOSIS — I480 Paroxysmal atrial fibrillation: Secondary | ICD-10-CM | POA: Diagnosis not present

## 2016-04-05 DIAGNOSIS — I509 Heart failure, unspecified: Secondary | ICD-10-CM | POA: Diagnosis not present

## 2016-04-05 DIAGNOSIS — M810 Age-related osteoporosis without current pathological fracture: Secondary | ICD-10-CM | POA: Diagnosis not present

## 2016-04-05 DIAGNOSIS — R531 Weakness: Secondary | ICD-10-CM | POA: Diagnosis not present

## 2016-04-08 DIAGNOSIS — R531 Weakness: Secondary | ICD-10-CM | POA: Diagnosis not present

## 2016-04-08 DIAGNOSIS — I509 Heart failure, unspecified: Secondary | ICD-10-CM | POA: Diagnosis not present

## 2016-04-08 DIAGNOSIS — I110 Hypertensive heart disease with heart failure: Secondary | ICD-10-CM | POA: Diagnosis not present

## 2016-04-08 DIAGNOSIS — M810 Age-related osteoporosis without current pathological fracture: Secondary | ICD-10-CM | POA: Diagnosis not present

## 2016-04-08 DIAGNOSIS — M1990 Unspecified osteoarthritis, unspecified site: Secondary | ICD-10-CM | POA: Diagnosis not present

## 2016-04-08 DIAGNOSIS — I480 Paroxysmal atrial fibrillation: Secondary | ICD-10-CM | POA: Diagnosis not present

## 2016-04-09 DIAGNOSIS — I2510 Atherosclerotic heart disease of native coronary artery without angina pectoris: Secondary | ICD-10-CM | POA: Diagnosis not present

## 2016-04-09 DIAGNOSIS — Z959 Presence of cardiac and vascular implant and graft, unspecified: Secondary | ICD-10-CM | POA: Diagnosis not present

## 2016-04-09 DIAGNOSIS — I10 Essential (primary) hypertension: Secondary | ICD-10-CM | POA: Diagnosis not present

## 2016-04-09 DIAGNOSIS — M160 Bilateral primary osteoarthritis of hip: Secondary | ICD-10-CM | POA: Diagnosis not present

## 2016-04-10 DIAGNOSIS — M810 Age-related osteoporosis without current pathological fracture: Secondary | ICD-10-CM | POA: Diagnosis not present

## 2016-04-10 DIAGNOSIS — I509 Heart failure, unspecified: Secondary | ICD-10-CM | POA: Diagnosis not present

## 2016-04-10 DIAGNOSIS — R531 Weakness: Secondary | ICD-10-CM | POA: Diagnosis not present

## 2016-04-10 DIAGNOSIS — I110 Hypertensive heart disease with heart failure: Secondary | ICD-10-CM | POA: Diagnosis not present

## 2016-04-10 DIAGNOSIS — M1990 Unspecified osteoarthritis, unspecified site: Secondary | ICD-10-CM | POA: Diagnosis not present

## 2016-04-10 DIAGNOSIS — I480 Paroxysmal atrial fibrillation: Secondary | ICD-10-CM | POA: Diagnosis not present

## 2016-04-12 DIAGNOSIS — R531 Weakness: Secondary | ICD-10-CM | POA: Diagnosis not present

## 2016-04-12 DIAGNOSIS — I509 Heart failure, unspecified: Secondary | ICD-10-CM | POA: Diagnosis not present

## 2016-04-12 DIAGNOSIS — I110 Hypertensive heart disease with heart failure: Secondary | ICD-10-CM | POA: Diagnosis not present

## 2016-04-12 DIAGNOSIS — I480 Paroxysmal atrial fibrillation: Secondary | ICD-10-CM | POA: Diagnosis not present

## 2016-04-12 DIAGNOSIS — M810 Age-related osteoporosis without current pathological fracture: Secondary | ICD-10-CM | POA: Diagnosis not present

## 2016-04-12 DIAGNOSIS — M1990 Unspecified osteoarthritis, unspecified site: Secondary | ICD-10-CM | POA: Diagnosis not present

## 2016-04-15 DIAGNOSIS — M810 Age-related osteoporosis without current pathological fracture: Secondary | ICD-10-CM | POA: Diagnosis not present

## 2016-04-15 DIAGNOSIS — M1990 Unspecified osteoarthritis, unspecified site: Secondary | ICD-10-CM | POA: Diagnosis not present

## 2016-04-15 DIAGNOSIS — R531 Weakness: Secondary | ICD-10-CM | POA: Diagnosis not present

## 2016-04-15 DIAGNOSIS — I110 Hypertensive heart disease with heart failure: Secondary | ICD-10-CM | POA: Diagnosis not present

## 2016-04-15 DIAGNOSIS — I480 Paroxysmal atrial fibrillation: Secondary | ICD-10-CM | POA: Diagnosis not present

## 2016-04-15 DIAGNOSIS — I509 Heart failure, unspecified: Secondary | ICD-10-CM | POA: Diagnosis not present

## 2016-04-16 DIAGNOSIS — I480 Paroxysmal atrial fibrillation: Secondary | ICD-10-CM | POA: Diagnosis not present

## 2016-04-16 DIAGNOSIS — M1990 Unspecified osteoarthritis, unspecified site: Secondary | ICD-10-CM | POA: Diagnosis not present

## 2016-04-16 DIAGNOSIS — R531 Weakness: Secondary | ICD-10-CM | POA: Diagnosis not present

## 2016-04-16 DIAGNOSIS — M810 Age-related osteoporosis without current pathological fracture: Secondary | ICD-10-CM | POA: Diagnosis not present

## 2016-04-16 DIAGNOSIS — I110 Hypertensive heart disease with heart failure: Secondary | ICD-10-CM | POA: Diagnosis not present

## 2016-04-16 DIAGNOSIS — I509 Heart failure, unspecified: Secondary | ICD-10-CM | POA: Diagnosis not present

## 2016-04-18 DIAGNOSIS — M79674 Pain in right toe(s): Secondary | ICD-10-CM | POA: Diagnosis not present

## 2016-04-18 DIAGNOSIS — B351 Tinea unguium: Secondary | ICD-10-CM | POA: Diagnosis not present

## 2016-04-18 DIAGNOSIS — I739 Peripheral vascular disease, unspecified: Secondary | ICD-10-CM | POA: Diagnosis not present

## 2016-04-18 DIAGNOSIS — M79675 Pain in left toe(s): Secondary | ICD-10-CM | POA: Diagnosis not present

## 2016-04-19 DIAGNOSIS — R531 Weakness: Secondary | ICD-10-CM | POA: Diagnosis not present

## 2016-04-19 DIAGNOSIS — M810 Age-related osteoporosis without current pathological fracture: Secondary | ICD-10-CM | POA: Diagnosis not present

## 2016-04-19 DIAGNOSIS — I509 Heart failure, unspecified: Secondary | ICD-10-CM | POA: Diagnosis not present

## 2016-04-19 DIAGNOSIS — M1990 Unspecified osteoarthritis, unspecified site: Secondary | ICD-10-CM | POA: Diagnosis not present

## 2016-04-19 DIAGNOSIS — I110 Hypertensive heart disease with heart failure: Secondary | ICD-10-CM | POA: Diagnosis not present

## 2016-04-19 DIAGNOSIS — I480 Paroxysmal atrial fibrillation: Secondary | ICD-10-CM | POA: Diagnosis not present

## 2016-04-22 DIAGNOSIS — I509 Heart failure, unspecified: Secondary | ICD-10-CM | POA: Diagnosis not present

## 2016-04-22 DIAGNOSIS — M1990 Unspecified osteoarthritis, unspecified site: Secondary | ICD-10-CM | POA: Diagnosis not present

## 2016-04-22 DIAGNOSIS — I110 Hypertensive heart disease with heart failure: Secondary | ICD-10-CM | POA: Diagnosis not present

## 2016-04-22 DIAGNOSIS — R531 Weakness: Secondary | ICD-10-CM | POA: Diagnosis not present

## 2016-04-22 DIAGNOSIS — I480 Paroxysmal atrial fibrillation: Secondary | ICD-10-CM | POA: Diagnosis not present

## 2016-04-22 DIAGNOSIS — M810 Age-related osteoporosis without current pathological fracture: Secondary | ICD-10-CM | POA: Diagnosis not present

## 2016-04-24 DIAGNOSIS — M810 Age-related osteoporosis without current pathological fracture: Secondary | ICD-10-CM | POA: Diagnosis not present

## 2016-04-24 DIAGNOSIS — I509 Heart failure, unspecified: Secondary | ICD-10-CM | POA: Diagnosis not present

## 2016-04-24 DIAGNOSIS — I480 Paroxysmal atrial fibrillation: Secondary | ICD-10-CM | POA: Diagnosis not present

## 2016-04-24 DIAGNOSIS — I110 Hypertensive heart disease with heart failure: Secondary | ICD-10-CM | POA: Diagnosis not present

## 2016-04-24 DIAGNOSIS — M1990 Unspecified osteoarthritis, unspecified site: Secondary | ICD-10-CM | POA: Diagnosis not present

## 2016-04-24 DIAGNOSIS — R531 Weakness: Secondary | ICD-10-CM | POA: Diagnosis not present

## 2016-04-26 ENCOUNTER — Ambulatory Visit (INDEPENDENT_AMBULATORY_CARE_PROVIDER_SITE_OTHER): Payer: Medicare Other | Admitting: Cardiovascular Disease

## 2016-04-26 ENCOUNTER — Other Ambulatory Visit (HOSPITAL_COMMUNITY)
Admission: RE | Admit: 2016-04-26 | Discharge: 2016-04-26 | Disposition: A | Discharge status: Home / Self Care | Payer: Medicare Other | Source: Ambulatory Visit | Attending: Cardiovascular Disease | Admitting: Cardiovascular Disease

## 2016-04-26 ENCOUNTER — Encounter: Payer: Self-pay | Admitting: Cardiovascular Disease

## 2016-04-26 ENCOUNTER — Telehealth: Payer: Self-pay

## 2016-04-26 ENCOUNTER — Ambulatory Visit (INDEPENDENT_AMBULATORY_CARE_PROVIDER_SITE_OTHER): Payer: Self-pay | Admitting: *Deleted

## 2016-04-26 VITALS — BP 122/70 | HR 52 | Ht 63.0 in | Wt 142.0 lb

## 2016-04-26 DIAGNOSIS — I350 Nonrheumatic aortic (valve) stenosis: Secondary | ICD-10-CM | POA: Diagnosis not present

## 2016-04-26 DIAGNOSIS — I2510 Atherosclerotic heart disease of native coronary artery without angina pectoris: Secondary | ICD-10-CM | POA: Diagnosis not present

## 2016-04-26 DIAGNOSIS — I10 Essential (primary) hypertension: Secondary | ICD-10-CM

## 2016-04-26 DIAGNOSIS — I480 Paroxysmal atrial fibrillation: Secondary | ICD-10-CM

## 2016-04-26 DIAGNOSIS — Z79899 Other long term (current) drug therapy: Secondary | ICD-10-CM

## 2016-04-26 DIAGNOSIS — I509 Heart failure, unspecified: Secondary | ICD-10-CM | POA: Diagnosis not present

## 2016-04-26 DIAGNOSIS — M810 Age-related osteoporosis without current pathological fracture: Secondary | ICD-10-CM | POA: Diagnosis not present

## 2016-04-26 DIAGNOSIS — M1990 Unspecified osteoarthritis, unspecified site: Secondary | ICD-10-CM | POA: Diagnosis not present

## 2016-04-26 DIAGNOSIS — Z950 Presence of cardiac pacemaker: Secondary | ICD-10-CM

## 2016-04-26 DIAGNOSIS — I110 Hypertensive heart disease with heart failure: Secondary | ICD-10-CM | POA: Diagnosis not present

## 2016-04-26 DIAGNOSIS — I5032 Chronic diastolic (congestive) heart failure: Secondary | ICD-10-CM

## 2016-04-26 DIAGNOSIS — R531 Weakness: Secondary | ICD-10-CM | POA: Diagnosis not present

## 2016-04-26 LAB — CUP PACEART INCLINIC DEVICE CHECK
Battery Impedance: 5917 Ohm
Battery Remaining Longevity: 3 mo
Battery Voltage: 2.66 V
Brady Statistic AS VP Percent: 11.6 %
Implantable Lead Implant Date: 20060926
Implantable Lead Location: 753859
Implantable Pulse Generator Implant Date: 20060926
Lead Channel Impedance Value: 474 Ohm
Lead Channel Setting Sensing Sensitivity: 2.8 mV
MDC IDC LEAD IMPLANT DT: 20060926
MDC IDC LEAD LOCATION: 753860
MDC IDC MSMT LEADCHNL RV IMPEDANCE VALUE: 423 Ohm
MDC IDC SESS DTM: 20180406170109
MDC IDC SET LEADCHNL RA PACING AMPLITUDE: 2 V
MDC IDC SET LEADCHNL RV PACING AMPLITUDE: 2.5 V
MDC IDC SET LEADCHNL RV PACING PULSEWIDTH: 0.6 ms
MDC IDC STAT BRADY AP VP PERCENT: 87.2 %
MDC IDC STAT BRADY AP VS PERCENT: 0.9 %
MDC IDC STAT BRADY AS VS PERCENT: 0.3 %

## 2016-04-26 LAB — CBC
HEMATOCRIT: 39.2 % (ref 36.0–46.0)
HEMOGLOBIN: 13.1 g/dL (ref 12.0–15.0)
MCH: 32 pg (ref 26.0–34.0)
MCHC: 33.4 g/dL (ref 30.0–36.0)
MCV: 95.8 fL (ref 78.0–100.0)
Platelets: 121 10*3/uL — ABNORMAL LOW (ref 150–400)
RBC: 4.09 MIL/uL (ref 3.87–5.11)
RDW: 14.5 % (ref 11.5–15.5)
WBC: 4.5 10*3/uL (ref 4.0–10.5)

## 2016-04-26 LAB — BASIC METABOLIC PANEL
ANION GAP: 7 (ref 5–15)
BUN: 16 mg/dL (ref 6–20)
CHLORIDE: 102 mmol/L (ref 101–111)
CO2: 29 mmol/L (ref 22–32)
Calcium: 9.2 mg/dL (ref 8.9–10.3)
Creatinine, Ser: 1 mg/dL (ref 0.44–1.00)
GFR calc Af Amer: 55 mL/min — ABNORMAL LOW (ref >60)
GFR, EST NON AFRICAN AMERICAN: 47 mL/min — AB (ref >60)
GLUCOSE: 116 mg/dL — AB (ref 65–99)
POTASSIUM: 3.8 mmol/L (ref 3.5–5.1)
Sodium: 138 mmol/L (ref 135–145)

## 2016-04-26 MED ORDER — ASPIRIN EC 81 MG PO TBEC: 81.0000 mg | DELAYED_RELEASE_TABLET | Freq: Every day | ORAL | 3 refills | Status: DC

## 2016-04-26 NOTE — Progress Notes (Signed)
Battery check in clinic. Estimated longevity 3 months (range: <1-9 months). No episodes recorded. No testing performed. No changes made. Follow up with DC/R in 1 month.

## 2016-04-26 NOTE — Progress Notes (Addendum)
SUBJECTIVE: The patient presents follow-up of chronic diastolic heart failure, aortic stenosis, CAD, and paroxysmal atrial fibrillation. She has a pacemaker for complete heart block. She is not an anticoagulation candidate.  Echocardiogram on 03/10/15 demonstrated vigorous left ventricular systolic function, EF 65-70%, grade 2 diastolic dysfunction with elevated filling pressures, severe asymmetric septal hypertrophy with some subvalvular disease with no significant gradient at rest, mild-to-moderate aortic stenosis with mild aortic regurgitation, mild mitral regurgitation , moderately reduced right ventricular systolic function, and severe tricuspid regurgitation with moderately increased pulmonary pressures, 53 mmHg.  She is feeling well overall. She denies chest pain and significant shortness of breath. Leg swelling is controlled with Lasix and compression stockings. She has had no recent bleeding issues.   Renal function and hemoglobin were relatively stable when last assessed in January. Her daughter has questions about resuming aspirin.  ECG performed in the office today which I ordered and personally interpreted demonstrates a ventricular paced rhythm.  She has 7 children who live in [place], [place], and [place]. She has several grandchildren and great grandchildren.    Review of Systems: As per "subjective", otherwise negative.  No Known Allergies  Current Outpatient Prescriptions  Medication Sig Dispense Refill  . acetaminophen (TYLENOL) 650 MG CR tablet Take 650 mg by mouth every 8 (eight) hours as needed for pain.     Marland Kitchen amiodarone (PACERONE) 200 MG tablet Take 1 tablet (200 mg total) by mouth daily. 90 tablet 3  . amLODipine (NORVASC) 5 MG tablet Take 1 tablet (5 mg total) by mouth daily. 90 tablet 3  . ferrous sulfate 325 (65 FE) MG EC tablet Take 65 mg by mouth 2 (two) times daily with a meal.     . furosemide (LASIX) 40 MG tablet Take 1 tablet (40 mg total) by  mouth daily. 90 tablet 3  . lisinopril (PRINIVIL,ZESTRIL) 40 MG tablet Take 1 tablet (40 mg total) by mouth daily. 90 tablet 0  . loratadine (CLARITIN) 10 MG tablet TK 1 T PO QD  0  . pantoprazole (PROTONIX) 40 MG tablet Take 1 tablet (40 mg total) by mouth 2 (two) times daily before a meal. 60 tablet 3  . potassium chloride SA (K-DUR,KLOR-CON) 20 MEQ tablet TAKE 1 TABLET BY MOUTH DAILY 90 tablet 3   No current facility-administered medications for this visit.     Past Medical History:  Diagnosis Date  . Anemia   . Anxiety   . Atrial fibrillation (HCC)   . DJD (degenerative joint disease)   . Hypertension   . Osteoporosis   . Seasonal allergies   . Sleep apnea     Past Surgical History:  Procedure Laterality Date  . CHOLECYSTECTOMY    . ESOPHAGOGASTRODUODENOSCOPY N/A 05/16/2015   Procedure: ESOPHAGOGASTRODUODENOSCOPY (EGD);  Surgeon: West Bali, MD;  Location: [facility];  Service: Endoscopy;  Laterality: N/A;  . HEMORRHOID SURGERY    . PACEMAKER INSERTION    . TOTAL HIP ARTHROPLASTY      Social History   Social History  . Marital status: Widowed    Spouse name: N/A  . Number of children: N/A  . Years of education: N/A   Occupational History  . Not on file.   Social History Main Topics  . Smoking status: Never Smoker  . Smokeless tobacco: Never Used  . Alcohol use No  . Drug use: No  . Sexual activity: Not on file   Other Topics Concern  . Not on file  Social History Narrative   Widowed   Daughter     Vitals:   04/26/16 0929  BP: 122/70  Pulse: (!) 52  SpO2: 98%  Weight: 142 lb (64.4 kg)  Height: 5\' 3"  (1.6 m)    Wt Readings from Last 3 Encounters:  04/26/16 142 lb (64.4 kg)  02/05/16 139 lb (63 kg)  01/27/16 149 lb (67.6 kg)     PHYSICAL EXAM General: NAD HEENT: Normal. Neck: No JVD, no thyromegaly. Lungs: Clear to auscultation bilaterally with normal respiratory effort. CV: Nondisplaced PMI.  Regular rate and rhythm, normal  S1/S2, no S3/S4, 2/6 systolic murmur loudest over RUSB, but heard along left sternal border as well (unchanged). No significant lower extremity edema.  Abdomen: Soft, nontender, no distention.  Neurologic: Alert and oriented.  Psych: Normal affect. Skin: Normal. Musculoskeletal: No gross deformities.    ECG: Most recent ECG reviewed.   Labs: Lab Results  Component Value Date/Time   K 3.4 (L) 01/27/2016 09:13 PM   BUN 16 01/27/2016 09:13 PM   CREATININE 1.00 01/27/2016 09:13 PM   ALT 36 01/27/2016 09:13 PM   TSH 3.302 03/08/2015 02:09 PM   HGB 13.2 01/27/2016 09:13 PM     Lipids: No results found for: LDLCALC, LDLDIRECT, CHOL, TRIG, HDL     ASSESSMENT AND PLAN: 1. CAD: Currently on amiodarone. Not on aspirin or statin. Low risk nuclear stress test (04/12/15). No changes to therapy. I will check a CBC to see if I can resume baby aspirin.  2. Symptomatic bradycardia s/p pacemaker: She takes amiodarone for paroxysmal atrial fibrillation and bigeminal PVCs. Normal device function when last checked. Going to be checked again today.  3. Essential HTN: Controlled on present therapy. No changes.  4. Chronic diastolic heart failure: Currently on Lasix 40 mg daily. She also wears compression stockings. I will check a basic metabolic panel to assess renal function.  5. Aortic stenosis: Mild to moderate when last assessed. Will continue to monitor.  6. Paroxysmal atrial fibrillation: Stable on amiodarone. Not an anticoagulation candidate.   Dispo: f/u 6 months   Prentice Docker, M.D., F.A.C.C.

## 2016-04-26 NOTE — Patient Instructions (Signed)
Your physician wants you to follow-up in: 6 months Dr Reggy Eye will receive a reminder letter in the mail two months in advance. If you don't receive a letter, please call our office to schedule the follow-up appointment.    Your physician recommends that you continue on your current medications as directed. Please refer to the Current Medication list given to you today.     Your physician recommends that you return for lab work in: cbc,bmet      Thank you for choosing [facility] !

## 2016-04-26 NOTE — Telephone Encounter (Signed)
-----   Message from Laqueta Linden, MD sent at 04/26/2016 10:27 AM EDT ----- Hgb normal. Can resume ASA 81 mg daily. Repeat CBC one month after resuming ASA.

## 2016-04-26 NOTE — Telephone Encounter (Signed)
Daughter [name] made aware of lab results,pt to resume ASA 81 mg daily,mailed cbc lab slip

## 2016-05-07 ENCOUNTER — Other Ambulatory Visit: Payer: Self-pay | Admitting: Internal Medicine

## 2016-05-07 DIAGNOSIS — I480 Paroxysmal atrial fibrillation: Secondary | ICD-10-CM

## 2016-06-14 ENCOUNTER — Ambulatory Visit (INDEPENDENT_AMBULATORY_CARE_PROVIDER_SITE_OTHER): Payer: Self-pay | Admitting: *Deleted

## 2016-06-14 ENCOUNTER — Other Ambulatory Visit (HOSPITAL_COMMUNITY)
Admission: RE | Admit: 2016-06-14 | Discharge: 2016-06-14 | Disposition: A | Discharge status: Home / Self Care | Payer: Medicare Other | Source: Ambulatory Visit | Attending: Cardiovascular Disease | Admitting: Cardiovascular Disease

## 2016-06-14 DIAGNOSIS — I442 Atrioventricular block, complete: Secondary | ICD-10-CM

## 2016-06-14 DIAGNOSIS — Z79899 Other long term (current) drug therapy: Secondary | ICD-10-CM | POA: Diagnosis not present

## 2016-06-14 LAB — CUP PACEART INCLINIC DEVICE CHECK
Battery Impedance: 7381 Ohm
Battery Remaining Longevity: 1 mo
Battery Voltage: 2.6 V
Brady Statistic AP VP Percent: 87.3 %
Brady Statistic AP VS Percent: 0.8 %
Brady Statistic AS VP Percent: 11.6 %
Brady Statistic AS VS Percent: 0.3 %
Date Time Interrogation Session: 20180525142740
Implantable Lead Implant Date: 20060926
Implantable Lead Location: 753859
Implantable Lead Location: 753860
Implantable Lead Model: 5076
Lead Channel Setting Pacing Amplitude: 2.5 V
Lead Channel Setting Sensing Sensitivity: 2.8 mV
MDC IDC LEAD IMPLANT DT: 20060926
MDC IDC MSMT LEADCHNL RA IMPEDANCE VALUE: 475 Ohm
MDC IDC MSMT LEADCHNL RV IMPEDANCE VALUE: 419 Ohm
MDC IDC PG IMPLANT DT: 20060926
MDC IDC SET LEADCHNL RA PACING AMPLITUDE: 2 V
MDC IDC SET LEADCHNL RV PACING PULSEWIDTH: 0.6 ms

## 2016-06-14 LAB — CBC
HCT: 36.6 % (ref 36.0–46.0)
Hemoglobin: 12.2 g/dL (ref 12.0–15.0)
MCH: 32.1 pg (ref 26.0–34.0)
MCHC: 33.3 g/dL (ref 30.0–36.0)
MCV: 96.3 fL (ref 78.0–100.0)
PLATELETS: 98 10*3/uL — AB (ref 150–400)
RBC: 3.8 MIL/uL — ABNORMAL LOW (ref 3.87–5.11)
RDW: 14.5 % (ref 11.5–15.5)
WBC: 4.4 10*3/uL (ref 4.0–10.5)

## 2016-06-14 NOTE — Progress Notes (Signed)
Pacemaker check in clinic for battery only (N/C). No mode switch or high ventricular rates noted. Estimated longevity <1 month remaining (range: <1-4 months). Patient will follow up with DC/R in 1 month.

## 2016-07-01 DIAGNOSIS — M79675 Pain in left toe(s): Secondary | ICD-10-CM | POA: Diagnosis not present

## 2016-07-01 DIAGNOSIS — B351 Tinea unguium: Secondary | ICD-10-CM | POA: Diagnosis not present

## 2016-07-01 DIAGNOSIS — I739 Peripheral vascular disease, unspecified: Secondary | ICD-10-CM | POA: Diagnosis not present

## 2016-07-11 DIAGNOSIS — I10 Essential (primary) hypertension: Secondary | ICD-10-CM | POA: Diagnosis not present

## 2016-07-11 DIAGNOSIS — Z959 Presence of cardiac and vascular implant and graft, unspecified: Secondary | ICD-10-CM | POA: Diagnosis not present

## 2016-07-11 DIAGNOSIS — I2510 Atherosclerotic heart disease of native coronary artery without angina pectoris: Secondary | ICD-10-CM | POA: Diagnosis not present

## 2016-07-11 DIAGNOSIS — I5022 Chronic systolic (congestive) heart failure: Secondary | ICD-10-CM | POA: Diagnosis not present

## 2016-07-25 ENCOUNTER — Ambulatory Visit (INDEPENDENT_AMBULATORY_CARE_PROVIDER_SITE_OTHER): Payer: Medicare Other | Admitting: *Deleted

## 2016-07-25 DIAGNOSIS — I442 Atrioventricular block, complete: Secondary | ICD-10-CM

## 2016-07-25 NOTE — Progress Notes (Signed)
Pacemaker check in clinic for battery estimate. ERI reached on 06/24/16. Mode reverted to VVI 65. Patient will follow up with GT on 7/10 in [place] office to discuss gen change.

## 2016-07-30 ENCOUNTER — Encounter: Payer: Self-pay | Admitting: Internal Medicine

## 2016-07-30 ENCOUNTER — Ambulatory Visit (INDEPENDENT_AMBULATORY_CARE_PROVIDER_SITE_OTHER): Payer: Medicare Other | Admitting: Internal Medicine

## 2016-07-30 VITALS — BP 146/70 | HR 66 | Ht 63.0 in | Wt 143.8 lb

## 2016-07-30 DIAGNOSIS — I442 Atrioventricular block, complete: Secondary | ICD-10-CM | POA: Diagnosis not present

## 2016-07-30 DIAGNOSIS — Z950 Presence of cardiac pacemaker: Secondary | ICD-10-CM | POA: Diagnosis not present

## 2016-07-30 DIAGNOSIS — I480 Paroxysmal atrial fibrillation: Secondary | ICD-10-CM | POA: Diagnosis not present

## 2016-07-30 DIAGNOSIS — I2510 Atherosclerotic heart disease of native coronary artery without angina pectoris: Secondary | ICD-10-CM

## 2016-07-30 NOTE — Progress Notes (Signed)
HPI [name] returns today for followup. She is a pleasant 83 yo woman with a h/o CHB, s/p PPM insertion, HTN, and dyslipidemia. In the interim, she has developed sob and PAF, but no syncope. She has peripheral edema. Her medtronic DDD PM has reached ERI.   No Known Allergies   Current Outpatient Prescriptions  Medication Sig Dispense Refill  . acetaminophen (TYLENOL) 500 MG tablet Take 500 mg by mouth at bedtime.    Marland Kitchen amiodarone (PACERONE) 200 MG tablet TAKE 1 TABLET(200 MG) BY MOUTH DAILY 90 tablet 0  . amLODipine (NORVASC) 5 MG tablet Take 1 tablet (5 mg total) by mouth daily. 90 tablet 3  . aspirin EC 81 MG tablet Take 1 tablet (81 mg total) by mouth daily. 90 tablet 3  . ferrous sulfate 325 (65 FE) MG EC tablet Take 65 mg by mouth 2 (two) times daily with a meal.     . furosemide (LASIX) 40 MG tablet Take 1 tablet (40 mg total) by mouth daily. 90 tablet 3  . lisinopril (PRINIVIL,ZESTRIL) 40 MG tablet Take 1 tablet (40 mg total) by mouth daily. 90 tablet 0  . pantoprazole (PROTONIX) 40 MG tablet Take 40 mg by mouth daily.    . potassium chloride SA (K-DUR,KLOR-CON) 20 MEQ tablet TAKE 1 TABLET BY MOUTH DAILY 90 tablet 3  . loratadine (CLARITIN) 10 MG tablet Takes one daily by mouth daily as needed for allergies  0   No current facility-administered medications for this visit.      Past Medical History:  Diagnosis Date  . Anemia   . Anxiety   . Atrial fibrillation (HCC)   . DJD (degenerative joint disease)   . Hypertension   . Osteoporosis   . Seasonal allergies   . Sleep apnea     ROS:   All systems reviewed and negative except as noted in the HPI.   Past Surgical History:  Procedure Laterality Date  . CHOLECYSTECTOMY    . ESOPHAGOGASTRODUODENOSCOPY N/A 05/16/2015   Procedure: ESOPHAGOGASTRODUODENOSCOPY (EGD);  Surgeon: West Bali, MD;  [facility];  Service: Endoscopy;  Laterality: N/A;  . HEMORRHOID SURGERY    . PACEMAKER INSERTION    . TOTAL HIP  ARTHROPLASTY       Family History  Problem Relation Age of Onset  . Colon cancer Father   . Colon cancer Paternal Aunt   . Crohn's disease Son      Social History   Social History  . Marital status: Widowed    Spouse name: N/A  . Number of children: N/A  . Years of education: N/A   Occupational History  . Not on file.   Social History Main Topics  . Smoking status: Never Smoker  . Smokeless tobacco: Never Used  . Alcohol use No  . Drug use: No  . Sexual activity: Not on file   Other Topics Concern  . Not on file   Social History Narrative   Widowed   Daughter     BP (!) 146/70   Pulse 66   Ht 5\' 3"  (1.6 m)   Wt 143 lb 12.8 oz (65.2 kg)   SpO2 98%   BMI 25.47 kg/m   Physical Exam:  Well appearing elderly woman, who looks younger than her stated age and in NAD HEENT: Unremarkable Neck:  6 cm JVD, no thyromegally Back:  No CVA tenderness Lungs:  Clear with no wheezes, rales, or rhonchi. Well healed PPM insertion. HEART:  Regular rate rhythm,  no murmurs, no rubs, no clicks Abd:  soft, positive bowel sounds, no organomegally, no rebound, no guarding Ext:  2 plus pulses, no edema, no cyanosis, no clubbing Skin:  No rashes no nodules Neuro:  CN II through XII intact, motor grossly intact   DEVICE  Normal device function.  See PaceArt for details. Device is at [place].  Assess/Plan: 1. PAF -  She will continue amiodarone. She is not a candidate for additional anti-coagulation due to GI bleeding 2. PVC's - these have improved 3. HTN  - her blood pressure is reasonably well controlled. Will follow. 4. Chronic diastolic heart failure - she has improved. She will continue her current meds.  5. PPM - she is at [facility] and has reverted to VVI pacing. She will undergo PPM generator change out in the coming weeks.   [name].D.

## 2016-07-30 NOTE — Patient Instructions (Addendum)
Medication Instructions:  Your physician recommends that you continue on your current medications as directed. Please refer to the Current Medication list given to you today.   Labwork: BMET/CBC today   Testing/Procedures: You have been advised to have your generator changed for your pacemaker.   Follow-Up: Follow up for a wound check in the Device Clinic 10-14 days from the date of procedure and follow up with Dr. [name] 91 days from date of implant.  Any Other Special Instructions Will Be Listed Below (If Applicable).  Please arrive at the [facility] main entrance of [facility] hospital at:  10:00 am.   Do not eat or drink after midnight prior to procedure Do not take any medications the morning of the procedure You will need someone to drive you home at discharge    If you need a refill on your cardiac medications before your next appointment, please call your pharmacy.

## 2016-07-31 LAB — BASIC METABOLIC PANEL WITH GFR
BUN/Creatinine Ratio: 15 (ref 12–28)
BUN: 18 mg/dL (ref 10–36)
CO2: 26 mmol/L (ref 20–29)
Calcium: 9.5 mg/dL (ref 8.7–10.3)
Chloride: 100 mmol/L (ref 96–106)
Creatinine, Ser: 1.18 mg/dL — ABNORMAL HIGH (ref 0.57–1.00)
GFR calc Af Amer: 46 mL/min/1.73 — ABNORMAL LOW
GFR calc non Af Amer: 40 mL/min/1.73 — ABNORMAL LOW
Glucose: 96 mg/dL (ref 65–99)
Potassium: 4 mmol/L (ref 3.5–5.2)
Sodium: 142 mmol/L (ref 134–144)

## 2016-07-31 LAB — CBC WITH DIFFERENTIAL/PLATELET
BASOS ABS: 0 10*3/uL (ref 0.0–0.2)
Basos: 0 %
EOS (ABSOLUTE): 0 10*3/uL (ref 0.0–0.4)
Eos: 1 %
Hematocrit: 38.5 % (ref 34.0–46.6)
Hemoglobin: 13.1 g/dL (ref 11.1–15.9)
Immature Grans (Abs): 0 10*3/uL (ref 0.0–0.1)
Immature Granulocytes: 0 %
LYMPHS ABS: 0.7 10*3/uL (ref 0.7–3.1)
Lymphs: 15 %
MCH: 32.1 pg (ref 26.6–33.0)
MCHC: 34 g/dL (ref 31.5–35.7)
MCV: 94 fL (ref 79–97)
MONOS ABS: 0.4 10*3/uL (ref 0.1–0.9)
Monocytes: 10 %
Neutrophils Absolute: 3.3 10*3/uL (ref 1.4–7.0)
Neutrophils: 74 %
Platelets: 127 10*3/uL — ABNORMAL LOW (ref 150–379)
RBC: 4.08 x10E6/uL (ref 3.77–5.28)
RDW: 15.4 % (ref 12.3–15.4)
WBC: 4.4 10*3/uL (ref 3.4–10.8)

## 2016-08-02 ENCOUNTER — Ambulatory Visit (HOSPITAL_COMMUNITY)
Admission: RE | Admit: 2016-08-02 | Discharge: 2016-08-02 | Disposition: A | Discharge status: Home / Self Care | Payer: Medicare Other | Source: Ambulatory Visit | Attending: Internal Medicine | Admitting: Internal Medicine

## 2016-08-02 ENCOUNTER — Encounter (HOSPITAL_COMMUNITY)
Admission: RE | Disposition: A | Discharge status: Home / Self Care | Payer: Self-pay | Source: Ambulatory Visit | Attending: Internal Medicine

## 2016-08-02 DIAGNOSIS — I5032 Chronic diastolic (congestive) heart failure: Secondary | ICD-10-CM | POA: Insufficient documentation

## 2016-08-02 DIAGNOSIS — G4730 Sleep apnea, unspecified: Secondary | ICD-10-CM | POA: Diagnosis not present

## 2016-08-02 DIAGNOSIS — Z7982 Long term (current) use of aspirin: Secondary | ICD-10-CM | POA: Diagnosis not present

## 2016-08-02 DIAGNOSIS — F419 Anxiety disorder, unspecified: Secondary | ICD-10-CM | POA: Insufficient documentation

## 2016-08-02 DIAGNOSIS — I480 Paroxysmal atrial fibrillation: Secondary | ICD-10-CM | POA: Diagnosis not present

## 2016-08-02 DIAGNOSIS — I442 Atrioventricular block, complete: Secondary | ICD-10-CM | POA: Diagnosis not present

## 2016-08-02 DIAGNOSIS — M810 Age-related osteoporosis without current pathological fracture: Secondary | ICD-10-CM | POA: Diagnosis not present

## 2016-08-02 DIAGNOSIS — E785 Hyperlipidemia, unspecified: Secondary | ICD-10-CM | POA: Insufficient documentation

## 2016-08-02 DIAGNOSIS — Z950 Presence of cardiac pacemaker: Secondary | ICD-10-CM | POA: Diagnosis present

## 2016-08-02 DIAGNOSIS — I4891 Unspecified atrial fibrillation: Secondary | ICD-10-CM | POA: Diagnosis not present

## 2016-08-02 DIAGNOSIS — Z45010 Encounter for checking and testing of cardiac pacemaker pulse generator [battery]: Secondary | ICD-10-CM | POA: Diagnosis not present

## 2016-08-02 DIAGNOSIS — I110 Hypertensive heart disease with heart failure: Secondary | ICD-10-CM | POA: Insufficient documentation

## 2016-08-02 DIAGNOSIS — Z01818 Encounter for other preprocedural examination: Secondary | ICD-10-CM | POA: Diagnosis not present

## 2016-08-02 HISTORY — PX: PPM GENERATOR CHANGEOUT: EP1233

## 2016-08-02 LAB — SURGICAL PCR SCREEN
MRSA, PCR: NEGATIVE
STAPHYLOCOCCUS AUREUS: NEGATIVE

## 2016-08-02 SURGERY — PPM GENERATOR CHANGEOUT

## 2016-08-02 MED ORDER — SODIUM CHLORIDE 0.9 % IV SOLN
INTRAVENOUS | Status: DC
  Administered 2016-08-02: 11:00:00 via INTRAVENOUS

## 2016-08-02 MED ORDER — ACETAMINOPHEN 325 MG PO TABS: 325.0000 mg | ORAL_TABLET | ORAL | Status: DC | PRN

## 2016-08-02 MED ORDER — ONDANSETRON HCL 4 MG/2ML IJ SOLN: 4.0000 mg | Freq: Four times a day (QID) | INTRAMUSCULAR | Status: DC | PRN

## 2016-08-02 MED ORDER — LIDOCAINE HCL (PF) 1 % IJ SOLN
INTRAMUSCULAR | Status: DC | PRN
  Administered 2016-08-02: 45 mL

## 2016-08-02 MED ORDER — CEFAZOLIN SODIUM-DEXTROSE 2-4 GM/100ML-% IV SOLN
INTRAVENOUS | Status: AC
  Filled 2016-08-02: qty 100

## 2016-08-02 MED ORDER — MIDAZOLAM HCL 5 MG/5ML IJ SOLN
INTRAMUSCULAR | Status: DC | PRN
  Administered 2016-08-02: 1 mg via INTRAVENOUS

## 2016-08-02 MED ORDER — MUPIROCIN 2 % EX OINT
TOPICAL_OINTMENT | CUTANEOUS | Status: AC
  Administered 2016-08-02: 1 via TOPICAL
  Filled 2016-08-02: qty 22

## 2016-08-02 MED ORDER — MUPIROCIN 2 % EX OINT
1.0000 "application " | TOPICAL_OINTMENT | Freq: Once | CUTANEOUS | Status: AC
  Administered 2016-08-02: 1 via TOPICAL

## 2016-08-02 MED ORDER — CEFAZOLIN SODIUM-DEXTROSE 2-4 GM/100ML-% IV SOLN
2.0000 g | INTRAVENOUS | Status: AC
  Administered 2016-08-02: 2 g via INTRAVENOUS

## 2016-08-02 MED ORDER — FENTANYL CITRATE (PF) 100 MCG/2ML IJ SOLN
INTRAMUSCULAR | Status: DC | PRN
  Administered 2016-08-02: 12.5 ug via INTRAVENOUS

## 2016-08-02 MED ORDER — LIDOCAINE HCL (PF) 1 % IJ SOLN
INTRAMUSCULAR | Status: AC
  Filled 2016-08-02: qty 60

## 2016-08-02 MED ORDER — SODIUM CHLORIDE 0.9 % IR SOLN
80.0000 mg | Status: AC
  Administered 2016-08-02: 80 mg

## 2016-08-02 MED ORDER — SODIUM CHLORIDE 0.9 % IR SOLN
Status: AC
  Filled 2016-08-02: qty 2

## 2016-08-02 MED ORDER — FENTANYL CITRATE (PF) 100 MCG/2ML IJ SOLN
INTRAMUSCULAR | Status: AC
  Filled 2016-08-02: qty 2

## 2016-08-02 MED ORDER — MIDAZOLAM HCL 5 MG/5ML IJ SOLN
INTRAMUSCULAR | Status: AC
  Filled 2016-08-02: qty 5

## 2016-08-02 SURGICAL SUPPLY — 5 items
CABLE SURGICAL S-101-97-12 (CABLE) ×1 IMPLANT
PACEMAKER ADAPTA DR ADDRL1 (Pacemaker) IMPLANT
PAD DEFIB LIFELINK (PAD) ×1 IMPLANT
PPM ADAPTA DR ADDRL1 (Pacemaker) ×2 IMPLANT
TRAY PACEMAKER INSERTION (PACKS) ×1 IMPLANT

## 2016-08-02 NOTE — H&P (View-Only) (Signed)
HPI [name] returns today for followup. She is a pleasant 83 yo woman with a h/o CHB, s/p PPM insertion, HTN, and dyslipidemia. In the interim, she has developed sob and PAF, but no syncope. She has peripheral edema. Her medtronic DDD PM has reached ERI.   No Known Allergies   Current Outpatient Prescriptions  Medication Sig Dispense Refill  . acetaminophen (TYLENOL) 500 MG tablet Take 500 mg by mouth at bedtime.    Marland Kitchen amiodarone (PACERONE) 200 MG tablet TAKE 1 TABLET(200 MG) BY MOUTH DAILY 90 tablet 0  . amLODipine (NORVASC) 5 MG tablet Take 1 tablet (5 mg total) by mouth daily. 90 tablet 3  . aspirin EC 81 MG tablet Take 1 tablet (81 mg total) by mouth daily. 90 tablet 3  . ferrous sulfate 325 (65 FE) MG EC tablet Take 65 mg by mouth 2 (two) times daily with a meal.     . furosemide (LASIX) 40 MG tablet Take 1 tablet (40 mg total) by mouth daily. 90 tablet 3  . lisinopril (PRINIVIL,ZESTRIL) 40 MG tablet Take 1 tablet (40 mg total) by mouth daily. 90 tablet 0  . pantoprazole (PROTONIX) 40 MG tablet Take 40 mg by mouth daily.    . potassium chloride SA (K-DUR,KLOR-CON) 20 MEQ tablet TAKE 1 TABLET BY MOUTH DAILY 90 tablet 3  . loratadine (CLARITIN) 10 MG tablet Takes one daily by mouth daily as needed for allergies  0   No current facility-administered medications for this visit.      Past Medical History:  Diagnosis Date  . Anemia   . Anxiety   . Atrial fibrillation (HCC)   . DJD (degenerative joint disease)   . Hypertension   . Osteoporosis   . Seasonal allergies   . Sleep apnea     ROS:   All systems reviewed and negative except as noted in the HPI.   Past Surgical History:  Procedure Laterality Date  . CHOLECYSTECTOMY    . ESOPHAGOGASTRODUODENOSCOPY N/A 05/16/2015   Procedure: ESOPHAGOGASTRODUODENOSCOPY (EGD);  Surgeon: West Bali, MD;  [facility];  Service: Endoscopy;  Laterality: N/A;  . HEMORRHOID SURGERY    . PACEMAKER INSERTION    . TOTAL HIP  ARTHROPLASTY       Family History  Problem Relation Age of Onset  . Colon cancer Father   . Colon cancer Paternal Aunt   . Crohn's disease Son      Social History   Social History  . Marital status: Widowed    Spouse name: N/A  . Number of children: N/A  . Years of education: N/A   Occupational History  . Not on file.   Social History Main Topics  . Smoking status: Never Smoker  . Smokeless tobacco: Never Used  . Alcohol use No  . Drug use: No  . Sexual activity: Not on file   Other Topics Concern  . Not on file   Social History Narrative   Widowed   Daughter     BP (!) 146/70   Pulse 66   Ht 5\' 3"  (1.6 m)   Wt 143 lb 12.8 oz (65.2 kg)   SpO2 98%   BMI 25.47 kg/m   Physical Exam:  Well appearing elderly woman, who looks younger than her stated age and in NAD HEENT: Unremarkable Neck:  6 cm JVD, no thyromegally Back:  No CVA tenderness Lungs:  Clear with no wheezes, rales, or rhonchi. Well healed PPM insertion. HEART:  Regular rate rhythm,  no murmurs, no rubs, no clicks Abd:  soft, positive bowel sounds, no organomegally, no rebound, no guarding Ext:  2 plus pulses, no edema, no cyanosis, no clubbing Skin:  No rashes no nodules Neuro:  CN II through XII intact, motor grossly intact   DEVICE  Normal device function.  See PaceArt for details. Device is at [place].  Assess/Plan: 1. PAF -  She will continue amiodarone. She is not a candidate for additional anti-coagulation due to GI bleeding 2. PVC's - these have improved 3. HTN  - her blood pressure is reasonably well controlled. Will follow. 4. Chronic diastolic heart failure - she has improved. She will continue her current meds.  5. PPM - she is at [facility] and has reverted to VVI pacing. She will undergo PPM generator change out in the coming weeks.   [name].D.

## 2016-08-02 NOTE — Discharge Instructions (Signed)

## 2016-08-02 NOTE — Interval H&P Note (Signed)
History and Physical Interval Note:  08/02/2016 12:33 PM  [name]  has presented today for surgery, with the diagnosis of EOL  The various methods of treatment have been discussed with the patient and family. After consideration of risks, benefits and other options for treatment, the patient has consented to  Procedure(s): PPM Generator Changeout (N/A) as a surgical intervention .  The patient's history has been reviewed, patient examined, no change in status, stable for surgery.  I have reviewed the patient's chart and labs.  Questions were answered to the patient's satisfaction.     Lewayne Bunting

## 2016-08-05 ENCOUNTER — Encounter (HOSPITAL_COMMUNITY): Payer: Self-pay | Admitting: Internal Medicine

## 2016-08-08 ENCOUNTER — Other Ambulatory Visit: Payer: Self-pay | Admitting: Internal Medicine

## 2016-08-08 DIAGNOSIS — I480 Paroxysmal atrial fibrillation: Secondary | ICD-10-CM

## 2016-08-14 ENCOUNTER — Ambulatory Visit (INDEPENDENT_AMBULATORY_CARE_PROVIDER_SITE_OTHER): Payer: Medicare Other | Admitting: *Deleted

## 2016-08-14 DIAGNOSIS — Z950 Presence of cardiac pacemaker: Secondary | ICD-10-CM | POA: Diagnosis not present

## 2016-08-14 DIAGNOSIS — I442 Atrioventricular block, complete: Secondary | ICD-10-CM | POA: Diagnosis not present

## 2016-08-14 LAB — CUP PACEART INCLINIC DEVICE CHECK
Battery Remaining Longevity: 131 mo
Brady Statistic AS VP Percent: 11 %
Date Time Interrogation Session: 20180725150504
Implantable Lead Implant Date: 20060926
Implantable Lead Location: 753859
Implantable Pulse Generator Implant Date: 20180713
Lead Channel Pacing Threshold Amplitude: 1 V
Lead Channel Pacing Threshold Pulse Width: 0.4 ms
Lead Channel Setting Pacing Amplitude: 1.5 V
Lead Channel Setting Pacing Pulse Width: 0.4 ms
Lead Channel Setting Sensing Sensitivity: 4 mV
MDC IDC LEAD IMPLANT DT: 20060926
MDC IDC LEAD LOCATION: 753860
MDC IDC MSMT BATTERY IMPEDANCE: 100 Ohm
MDC IDC MSMT BATTERY VOLTAGE: 2.79 V
MDC IDC MSMT LEADCHNL RA IMPEDANCE VALUE: 443 Ohm
MDC IDC MSMT LEADCHNL RA PACING THRESHOLD AMPLITUDE: 0.75 V
MDC IDC MSMT LEADCHNL RA PACING THRESHOLD PULSEWIDTH: 0.4 ms
MDC IDC MSMT LEADCHNL RA SENSING INTR AMPL: 0.7 mV
MDC IDC MSMT LEADCHNL RV IMPEDANCE VALUE: 434 Ohm
MDC IDC SET LEADCHNL RV PACING AMPLITUDE: 2.5 V
MDC IDC STAT BRADY AP VP PERCENT: 87 %
MDC IDC STAT BRADY AP VS PERCENT: 1 %
MDC IDC STAT BRADY AS VS PERCENT: 1 %

## 2016-08-14 NOTE — Progress Notes (Signed)
Wound check appointment. Steri-strips removed. Wound without redness or edema. Pinhole incision opening noted at R lateral corner, able to locate stitch but unable to remove, antibiotic ointment and bandaid applied. Patient and daughter instructed to reapply ointment and bandaid daily after showering x3-4 days and to call if not fully healed by early next week, or if they note any signs/symptoms of infection. Incision edges otherwise approximated and well healed. Patient and daughter verbalize understanding of instructions.  Normal device function. Thresholds, sensing, and impedances consistent with implant measurements. Device programmed at chronic outputs with auto capture programmed on. Histogram distribution blunted, rate response turned on per previous settings. RA max impedance reduced to 2000ohms per clinic protocol. No mode switches or high ventricular rates noted. Patient and daughter educated about wound care, arm mobility, lifting restrictions and Carelink monitor. ROV with GT/R on 11/22/16.

## 2016-09-09 DIAGNOSIS — M79675 Pain in left toe(s): Secondary | ICD-10-CM | POA: Diagnosis not present

## 2016-09-09 DIAGNOSIS — B351 Tinea unguium: Secondary | ICD-10-CM | POA: Diagnosis not present

## 2016-09-09 DIAGNOSIS — M79674 Pain in right toe(s): Secondary | ICD-10-CM | POA: Diagnosis not present

## 2016-09-09 DIAGNOSIS — I739 Peripheral vascular disease, unspecified: Secondary | ICD-10-CM | POA: Diagnosis not present

## 2016-10-08 DIAGNOSIS — I2510 Atherosclerotic heart disease of native coronary artery without angina pectoris: Secondary | ICD-10-CM | POA: Diagnosis not present

## 2016-10-08 DIAGNOSIS — I509 Heart failure, unspecified: Secondary | ICD-10-CM | POA: Diagnosis not present

## 2016-10-08 DIAGNOSIS — I10 Essential (primary) hypertension: Secondary | ICD-10-CM | POA: Diagnosis not present

## 2016-10-08 DIAGNOSIS — Z959 Presence of cardiac and vascular implant and graft, unspecified: Secondary | ICD-10-CM | POA: Diagnosis not present

## 2016-10-08 DIAGNOSIS — Z23 Encounter for immunization: Secondary | ICD-10-CM | POA: Diagnosis not present

## 2016-10-08 DIAGNOSIS — D649 Anemia, unspecified: Secondary | ICD-10-CM | POA: Diagnosis not present

## 2016-10-28 ENCOUNTER — Ambulatory Visit (INDEPENDENT_AMBULATORY_CARE_PROVIDER_SITE_OTHER): Payer: Medicare Other | Admitting: Cardiovascular Disease

## 2016-10-28 ENCOUNTER — Encounter: Payer: Self-pay | Admitting: Cardiovascular Disease

## 2016-10-28 VITALS — BP 154/78 | HR 64 | Ht 63.0 in | Wt 149.0 lb

## 2016-10-28 DIAGNOSIS — I2510 Atherosclerotic heart disease of native coronary artery without angina pectoris: Secondary | ICD-10-CM | POA: Diagnosis not present

## 2016-10-28 DIAGNOSIS — I25118 Atherosclerotic heart disease of native coronary artery with other forms of angina pectoris: Secondary | ICD-10-CM | POA: Diagnosis not present

## 2016-10-28 DIAGNOSIS — I10 Essential (primary) hypertension: Secondary | ICD-10-CM | POA: Diagnosis not present

## 2016-10-28 DIAGNOSIS — R531 Weakness: Secondary | ICD-10-CM | POA: Diagnosis not present

## 2016-10-28 DIAGNOSIS — I5032 Chronic diastolic (congestive) heart failure: Secondary | ICD-10-CM

## 2016-10-28 DIAGNOSIS — I350 Nonrheumatic aortic (valve) stenosis: Secondary | ICD-10-CM

## 2016-10-28 DIAGNOSIS — I209 Angina pectoris, unspecified: Secondary | ICD-10-CM | POA: Diagnosis not present

## 2016-10-28 DIAGNOSIS — I480 Paroxysmal atrial fibrillation: Secondary | ICD-10-CM

## 2016-10-28 DIAGNOSIS — Z950 Presence of cardiac pacemaker: Secondary | ICD-10-CM

## 2016-10-28 NOTE — Progress Notes (Signed)
SUBJECTIVE: The patient presents for routine follow-up. She underwent a generator change out for her pacemaker in 08/02/16. The patient presents follow-up of chronic diastolic heart failure, aortic stenosis, CAD, and paroxysmal atrial fibrillation.  She is not an anticoagulation candidate.  Echocardiogram on 03/10/15 demonstrated vigorous left ventricular systolic function, EF 65-70%, grade 2 diastolic dysfunction with elevated filling pressures, severe asymmetric septal hypertrophy with some subvalvular disease with no significant gradient at rest, mild-to-moderate aortic stenosis with mild aortic regurgitation, mild mitral regurgitation , moderately reduced right ventricular systolic function, and severe tricuspid regurgitation with moderately increased pulmonary pressures, 53 mmHg.  She denies chest pain and shortness of breath. She does not drink much water. Her primary complaint relates to low energy levels which have been going on for at least a year. She said she tires very easily.  I reviewed labs from July 2018 which demonstrated normal hemoglobin of 13.1. She had blood tests done at her PCPs office 3 weeks ago which I will request.  She underwent a low risk nuclear stress test on 04/12/15 with no evidence of ischemia.   Soc Hx: She has 7 children who live in [place], [place], and [place]. She has several grandchildren and great grandchildren.   Review of Systems: As per "subjective", otherwise negative.  No Known Allergies  Current Outpatient Prescriptions  Medication Sig Dispense Refill  . acetaminophen (TYLENOL) 500 MG tablet Take 500 mg by mouth at bedtime.    Marland Kitchen amiodarone (PACERONE) 200 MG tablet Take 1 tablet (200 mg total) by mouth daily. 90 tablet 0  . amLODipine (NORVASC) 5 MG tablet Take 1 tablet (5 mg total) by mouth daily. 90 tablet 3  . aspirin EC 81 MG tablet Take 1 tablet (81 mg total) by mouth daily. 90 tablet 3  . ferrous sulfate 325 (65 FE) MG EC  tablet Take 325 mg by mouth 2 (two) times daily with a meal.     . fluticasone (FLONASE) 50 MCG/ACT nasal spray Place 2 sprays into both nostrils daily.    . furosemide (LASIX) 40 MG tablet Take 1 tablet (40 mg total) by mouth daily. 90 tablet 3  . lisinopril (PRINIVIL,ZESTRIL) 40 MG tablet Take 1 tablet (40 mg total) by mouth daily. 90 tablet 0  . loratadine (CLARITIN) 10 MG tablet Take 1 tablet by mouth daily as needed for allergies  0  . pantoprazole (PROTONIX) 40 MG tablet Take 40 mg by mouth daily.    . potassium chloride SA (K-DUR,KLOR-CON) 20 MEQ tablet TAKE 1 TABLET BY MOUTH DAILY 90 tablet 3   No current facility-administered medications for this visit.     Past Medical History:  Diagnosis Date  . Anemia   . Anxiety   . Atrial fibrillation (HCC)   . DJD (degenerative joint disease)   . Hypertension   . Osteoporosis   . Seasonal allergies   . Sleep apnea     Past Surgical History:  Procedure Laterality Date  . CHOLECYSTECTOMY    . ESOPHAGOGASTRODUODENOSCOPY N/A 05/16/2015   Procedure: ESOPHAGOGASTRODUODENOSCOPY (EGD);  Surgeon: West Bali, MD;  [facility];  Service: Endoscopy;  Laterality: N/A;  . HEMORRHOID SURGERY    . PACEMAKER INSERTION    . PPM GENERATOR CHANGEOUT N/A 08/02/2016   Procedure: PPM Generator Changeout;  Surgeon: Marinus Maw, MD;  Location: [facility] INVASIVE CV LAB;  Service: Cardiovascular;  Laterality: N/A;  . TOTAL HIP ARTHROPLASTY      Social History  Social History  . Marital status: Widowed    Spouse name: N/A  . Number of children: N/A  . Years of education: N/A   Occupational History  . Not on file.   Social History Main Topics  . Smoking status: Never Smoker  . Smokeless tobacco: Never Used  . Alcohol use No  . Drug use: No  . Sexual activity: Not on file   Other Topics Concern  . Not on file   Social History Narrative   Widowed   Daughter     Vitals:   10/28/16 1127  BP: (!) 154/78  Pulse: 64  SpO2:  95%  Weight: 149 lb (67.6 kg)  Height: 5\' 3"  (1.6 m)    Wt Readings from Last 3 Encounters:  10/28/16 149 lb (67.6 kg)  08/02/16 143 lb (64.9 kg)  07/30/16 143 lb 12.8 oz (65.2 kg)     PHYSICAL EXAM General: NAD HEENT: Normal. Neck: No JVD, no thyromegaly. Lungs: Clear to auscultation bilaterally with normal respiratory effort. CV: Nondisplaced PMI.  Regular rate and rhythm, normal S1/S2, no S3/S4, 2/6 systolic murmur loudest over RUSB, but heard along left sternal border as well (unchanged). No pretibial or periankle edema. Abdomen: Soft, nontender, no distention.  Neurologic: Alert and oriented.  Psych: Normal affect. Skin: Normal. Musculoskeletal: No gross deformities.    ECG: Most recent ECG reviewed.   Labs: Lab Results  Component Value Date/Time   K 4.0 07/30/2016 12:39 PM   BUN 18 07/30/2016 12:39 PM   CREATININE 1.18 (H) 07/30/2016 12:39 PM   ALT 36 01/27/2016 09:13 PM   TSH 3.302 03/08/2015 02:09 PM   HGB 13.1 07/30/2016 12:39 PM     Lipids: No results found for: LDLCALC, LDLDIRECT, CHOL, TRIG, HDL     ASSESSMENT AND PLAN: 1. CAD: Currently on amiodarone and aspirin. Not on statin. Low risk nuclear stress test (04/12/15). No changes to therapy.   2. Symptomatic bradycardia s/p pacemaker: She takes amiodarone for paroxysmal atrial fibrillation and bigeminal PVCs. Normal device function when last checked. Recent generator change out. Follows with EP.  3. Essential HTN: Mildly elevated. I will monitor.  4. Chronic diastolic heart failure: Currently on Lasix 40 mg daily. She also wears compression stockings. No changes to therapy. BUN 18, creatinine 1.18 on 07/30/16.  5. Aortic stenosis: Mild to moderate when last assessed. Will continue to monitor.  6. Paroxysmal atrial fibrillation: Stable on amiodarone. Not an anticoagulation candidate.  7. Generalized weakness and fatigue: I will obtain a copy of labs from PCP. I encouraged increased fluid  intake.   Disposition: Follow up 6 months.   Prentice Docker, M.D., F.A.C.C.

## 2016-10-28 NOTE — Patient Instructions (Signed)
Your physician wants you to follow-up in: 6 months with Dr.McDowell You will receive a reminder letter in the mail two months in advance. If you don't receive a letter, please call our office to schedule the follow-up appointment.     Your physician recommends that you continue on your current medications as directed. Please refer to the Current Medication list given to you today.     If you need a refill on your cardiac medications before your next appointment, please call your pharmacy.      I will get your lab results from Dr.Fanta's office      No testing ordered today.         Thank you for choosing [facility] !

## 2016-11-01 ENCOUNTER — Other Ambulatory Visit: Payer: Self-pay | Admitting: Internal Medicine

## 2016-11-01 DIAGNOSIS — I480 Paroxysmal atrial fibrillation: Secondary | ICD-10-CM

## 2016-11-13 ENCOUNTER — Encounter: Payer: Medicare Other | Admitting: Internal Medicine

## 2016-11-18 DIAGNOSIS — B351 Tinea unguium: Secondary | ICD-10-CM | POA: Diagnosis not present

## 2016-11-18 DIAGNOSIS — M79675 Pain in left toe(s): Secondary | ICD-10-CM | POA: Diagnosis not present

## 2016-11-18 DIAGNOSIS — M79674 Pain in right toe(s): Secondary | ICD-10-CM | POA: Diagnosis not present

## 2016-11-18 DIAGNOSIS — I739 Peripheral vascular disease, unspecified: Secondary | ICD-10-CM | POA: Diagnosis not present

## 2016-11-22 ENCOUNTER — Ambulatory Visit (INDEPENDENT_AMBULATORY_CARE_PROVIDER_SITE_OTHER): Payer: Medicare Other | Admitting: Internal Medicine

## 2016-11-22 ENCOUNTER — Other Ambulatory Visit (HOSPITAL_COMMUNITY)
Admission: RE | Admit: 2016-11-22 | Discharge: 2016-11-22 | Disposition: A | Discharge status: Home / Self Care | Payer: Medicare Other | Source: Ambulatory Visit | Attending: Internal Medicine | Admitting: Internal Medicine

## 2016-11-22 ENCOUNTER — Encounter: Payer: Self-pay | Admitting: Internal Medicine

## 2016-11-22 VITALS — BP 126/64 | HR 69 | Ht 63.0 in | Wt 147.0 lb

## 2016-11-22 DIAGNOSIS — I5032 Chronic diastolic (congestive) heart failure: Secondary | ICD-10-CM | POA: Insufficient documentation

## 2016-11-22 DIAGNOSIS — I442 Atrioventricular block, complete: Secondary | ICD-10-CM | POA: Diagnosis not present

## 2016-11-22 DIAGNOSIS — Z950 Presence of cardiac pacemaker: Secondary | ICD-10-CM

## 2016-11-22 DIAGNOSIS — I10 Essential (primary) hypertension: Secondary | ICD-10-CM

## 2016-11-22 DIAGNOSIS — I2510 Atherosclerotic heart disease of native coronary artery without angina pectoris: Secondary | ICD-10-CM

## 2016-11-22 LAB — CBC WITH DIFFERENTIAL/PLATELET
BASOS ABS: 0 10*3/uL (ref 0.0–0.1)
BASOS PCT: 0 %
Eosinophils Absolute: 0.1 10*3/uL (ref 0.0–0.7)
Eosinophils Relative: 1 %
HEMATOCRIT: 33.8 % — AB (ref 36.0–46.0)
HEMOGLOBIN: 11.1 g/dL — AB (ref 12.0–15.0)
Lymphocytes Relative: 15 %
Lymphs Abs: 0.6 10*3/uL — ABNORMAL LOW (ref 0.7–4.0)
MCH: 31.9 pg (ref 26.0–34.0)
MCHC: 32.8 g/dL (ref 30.0–36.0)
MCV: 97.1 fL (ref 78.0–100.0)
Monocytes Absolute: 0.5 10*3/uL (ref 0.1–1.0)
Monocytes Relative: 12 %
NEUTROS ABS: 2.7 10*3/uL (ref 1.7–7.7)
NEUTROS PCT: 72 %
Platelets: 130 10*3/uL — ABNORMAL LOW (ref 150–400)
RBC: 3.48 MIL/uL — ABNORMAL LOW (ref 3.87–5.11)
RDW: 14.4 % (ref 11.5–15.5)
WBC: 3.8 10*3/uL — ABNORMAL LOW (ref 4.0–10.5)

## 2016-11-22 LAB — CUP PACEART INCLINIC DEVICE CHECK
Battery Remaining Longevity: 126 mo
Battery Voltage: 2.79 V
Brady Statistic AP VP Percent: 90 %
Implantable Lead Implant Date: 20060926
Implantable Lead Model: 5076
Implantable Pulse Generator Implant Date: 20180713
Lead Channel Impedance Value: 432 Ohm
Lead Channel Impedance Value: 438 Ohm
Lead Channel Pacing Threshold Amplitude: 0.625 V
Lead Channel Pacing Threshold Amplitude: 0.875 V
Lead Channel Pacing Threshold Pulse Width: 0.4 ms
Lead Channel Pacing Threshold Pulse Width: 0.4 ms
Lead Channel Setting Pacing Amplitude: 2.5 V
Lead Channel Setting Sensing Sensitivity: 4 mV
MDC IDC LEAD IMPLANT DT: 20060926
MDC IDC LEAD LOCATION: 753859
MDC IDC LEAD LOCATION: 753860
MDC IDC MSMT BATTERY IMPEDANCE: 100 Ohm
MDC IDC MSMT LEADCHNL RA PACING THRESHOLD AMPLITUDE: 0.75 V
MDC IDC MSMT LEADCHNL RA PACING THRESHOLD PULSEWIDTH: 0.4 ms
MDC IDC MSMT LEADCHNL RA SENSING INTR AMPL: 1 mV
MDC IDC MSMT LEADCHNL RV PACING THRESHOLD AMPLITUDE: 1 V
MDC IDC MSMT LEADCHNL RV PACING THRESHOLD PULSEWIDTH: 0.46 ms
MDC IDC MSMT LEADCHNL RV SENSING INTR AMPL: 15.67 mV
MDC IDC SESS DTM: 20181102121508
MDC IDC SET LEADCHNL RA PACING AMPLITUDE: 1.5 V
MDC IDC SET LEADCHNL RV PACING PULSEWIDTH: 0.46 ms
MDC IDC STAT BRADY AP VS PERCENT: 1 %
MDC IDC STAT BRADY AS VP PERCENT: 9 %
MDC IDC STAT BRADY AS VS PERCENT: 0 %

## 2016-11-22 LAB — TSH: TSH: 2.676 u[IU]/mL (ref 0.350–4.500)

## 2016-11-22 LAB — BASIC METABOLIC PANEL
ANION GAP: 9 (ref 5–15)
BUN: 17 mg/dL (ref 6–20)
CALCIUM: 9 mg/dL (ref 8.9–10.3)
CHLORIDE: 103 mmol/L (ref 101–111)
CO2: 28 mmol/L (ref 22–32)
Creatinine, Ser: 1.05 mg/dL — ABNORMAL HIGH (ref 0.44–1.00)
GFR calc non Af Amer: 44 mL/min — ABNORMAL LOW (ref >60)
GFR, EST AFRICAN AMERICAN: 51 mL/min — AB (ref >60)
Glucose, Bld: 107 mg/dL — ABNORMAL HIGH (ref 65–99)
Potassium: 3.6 mmol/L (ref 3.5–5.1)
Sodium: 140 mmol/L (ref 135–145)

## 2016-11-22 MED ORDER — FUROSEMIDE 40 MG PO TABS: 40.0000 mg | ORAL_TABLET | Freq: Two times a day (BID) | ORAL | 11 refills | Status: DC

## 2016-11-22 NOTE — Progress Notes (Signed)
HPI [name] returns today for ongoing evaluation and management of her permanent pacemaker in the setting of complete heart block, in the setting of chronic diastolic heart failure. In the interim she has had increasing fatigue and weakness. She notes shortness of breath. She underwent pacemaker generator change out approximately 3 months ago. No syncope. She has chronic peripheral edema. She denies anginal symptoms. No Known Allergies   Current Outpatient Prescriptions  Medication Sig Dispense Refill  . acetaminophen (TYLENOL) 500 MG tablet Take 500 mg by mouth at bedtime.    Marland Kitchen amiodarone (PACERONE) 200 MG tablet TAKE 1 TABLET(200 MG) BY MOUTH DAILY 90 tablet 2  . amLODipine (NORVASC) 5 MG tablet Take 1 tablet (5 mg total) by mouth daily. 90 tablet 3  . aspirin EC 81 MG tablet Take 1 tablet (81 mg total) by mouth daily. 90 tablet 3  . ferrous sulfate 325 (65 FE) MG EC tablet Take 325 mg by mouth 2 (two) times daily with a meal.     . fluticasone (FLONASE) 50 MCG/ACT nasal spray Place 2 sprays into both nostrils daily.    . furosemide (LASIX) 40 MG tablet Take 1 tablet (40 mg total) by mouth daily. 90 tablet 3  . lisinopril (PRINIVIL,ZESTRIL) 40 MG tablet Take 1 tablet (40 mg total) by mouth daily. 90 tablet 0  . loratadine (CLARITIN) 10 MG tablet Take 1 tablet by mouth daily as needed for allergies  0  . pantoprazole (PROTONIX) 40 MG tablet Take 40 mg by mouth daily.    . potassium chloride SA (K-DUR,KLOR-CON) 20 MEQ tablet TAKE 1 TABLET BY MOUTH DAILY 90 tablet 3   No current facility-administered medications for this visit.      Past Medical History:  Diagnosis Date  . Anemia   . Anxiety   . Atrial fibrillation (HCC)   . DJD (degenerative joint disease)   . Hypertension   . Osteoporosis   . Seasonal allergies   . Sleep apnea     ROS:   All systems reviewed and negative except as noted in the HPI.   Past Surgical History:  Procedure Laterality Date  .  CHOLECYSTECTOMY    . ESOPHAGOGASTRODUODENOSCOPY N/A 05/16/2015   Procedure: ESOPHAGOGASTRODUODENOSCOPY (EGD);  Surgeon: West Bali, MD;  [facility];  Service: Endoscopy;  Laterality: N/A;  . HEMORRHOID SURGERY    . PACEMAKER INSERTION    . PPM GENERATOR CHANGEOUT N/A 08/02/2016   Procedure: PPM Generator Changeout;  Surgeon: Marinus Maw, MD;  Location: [facility] INVASIVE CV LAB;  Service: Cardiovascular;  Laterality: N/A;  . TOTAL HIP ARTHROPLASTY       Family History  Problem Relation Age of Onset  . Colon cancer Father   . Colon cancer Paternal Aunt   . Crohn's disease Son      Social History   Social History  . Marital status: Widowed    Spouse name: N/A  . Number of children: N/A  . Years of education: N/A   Occupational History  . Not on file.   Social History Main Topics  . Smoking status: Never Smoker  . Smokeless tobacco: Never Used  . Alcohol use No  . Drug use: No  . Sexual activity: Not on file   Other Topics Concern  . Not on file   Social History Narrative   Widowed   Daughter     BP 126/64 (BP Location: Right Arm)   Pulse 69   Ht 5'  3" (1.6 m)   Wt 147 lb (66.7 kg)   SpO2 96%   BMI 26.04 kg/m   Physical Exam:  Well appearing 83 year old woman, NAD HEENT: Unremarkable Neck:  8 cm JVD, no thyromegally Lymphatics:  No adenopathy Back:  No CVA tenderness Lungs:  Clear except for rales in the bases bilaterally. No increased work of breathing. HEART:  Regular rate rhythm, 2/6 systolic murmurs, no rubs, no clicks Abd:  soft, positive bowel sounds, no organomegally, no rebound, no guarding Ext:  2 plus pulses, no edema, no cyanosis, no clubbing Skin:  No rashes no nodules Neuro:  CN II through XII intact, motor grossly intact  DEVICE  Normal device function.  See PaceArt for details.   Assess/Plan: 1. Complete heart block - she is stable status post pacemaker insertion. 2. Chronic diastolic heart failure - she appears to have  some volume overload today based on her lung exam and her neck exam. I've asked her to increase her diuretic therapy. We will obtain labs. 3. Pacemaker - her Medtronic dual-chamber pacemaker is working normally. Her underlying heart rate today was 35 but her paced rate is in the 70s. 4. Fatigue and weakness - unclear what could be going on to cause this. She has a history of GI bleeding and anemia and we will obtain a CBC and also check her thyroid function.  [name], M.D.

## 2016-11-22 NOTE — Patient Instructions (Signed)
Medication Instructions:  Your physician has recommended you make the following change in your medication:  Increase Lasix to 40 mg Two Times Daily    Labwork: Your physician recommends that you return for lab work in: ([facility], [facility]) Today Your physician recommends that you schedule a follow-up appointment in: 1 Year with Dr. [name]     Testing/Procedures: NONE  Follow-Up: Your physician recommends that you schedule a follow-up appointment in: January with Dr. Purvis Sheffield    Any Other Special Instructions Will Be Listed Below (If Applicable).     If you need a refill on your cardiac medications before your next appointment, please call your pharmacy. Thank you for choosing [facility]!

## 2016-12-06 ENCOUNTER — Other Ambulatory Visit: Payer: Self-pay | Admitting: *Deleted

## 2016-12-06 ENCOUNTER — Other Ambulatory Visit (HOSPITAL_COMMUNITY)
Admission: RE | Admit: 2016-12-06 | Discharge: 2016-12-06 | Disposition: A | Discharge status: Home / Self Care | Payer: Medicare Other | Source: Ambulatory Visit | Attending: Internal Medicine | Admitting: Internal Medicine

## 2016-12-06 DIAGNOSIS — E876 Hypokalemia: Secondary | ICD-10-CM

## 2016-12-06 DIAGNOSIS — I10 Essential (primary) hypertension: Secondary | ICD-10-CM | POA: Insufficient documentation

## 2016-12-06 DIAGNOSIS — I5032 Chronic diastolic (congestive) heart failure: Secondary | ICD-10-CM | POA: Diagnosis not present

## 2016-12-06 LAB — BASIC METABOLIC PANEL
Anion gap: 9 (ref 5–15)
BUN: 15 mg/dL (ref 6–20)
CALCIUM: 8.9 mg/dL (ref 8.9–10.3)
CO2: 29 mmol/L (ref 22–32)
CREATININE: 0.97 mg/dL (ref 0.44–1.00)
Chloride: 99 mmol/L — ABNORMAL LOW (ref 101–111)
GFR calc non Af Amer: 48 mL/min — ABNORMAL LOW (ref >60)
GFR, EST AFRICAN AMERICAN: 56 mL/min — AB (ref >60)
Glucose, Bld: 131 mg/dL — ABNORMAL HIGH (ref 65–99)
Potassium: 3 mmol/L — ABNORMAL LOW (ref 3.5–5.1)
SODIUM: 137 mmol/L (ref 135–145)

## 2016-12-11 ENCOUNTER — Other Ambulatory Visit: Payer: Medicare Other

## 2016-12-11 ENCOUNTER — Other Ambulatory Visit (HOSPITAL_COMMUNITY)
Admission: RE | Admit: 2016-12-11 | Discharge: 2016-12-11 | Disposition: A | Discharge status: Home / Self Care | Payer: Medicare Other | Source: Ambulatory Visit | Attending: Internal Medicine | Admitting: Internal Medicine

## 2016-12-11 DIAGNOSIS — E876 Hypokalemia: Secondary | ICD-10-CM | POA: Diagnosis not present

## 2016-12-11 LAB — BASIC METABOLIC PANEL
Anion gap: 8 (ref 5–15)
BUN: 20 mg/dL (ref 6–20)
CO2: 29 mmol/L (ref 22–32)
Calcium: 9.3 mg/dL (ref 8.9–10.3)
Chloride: 99 mmol/L — ABNORMAL LOW (ref 101–111)
Creatinine, Ser: 1.14 mg/dL — ABNORMAL HIGH (ref 0.44–1.00)
GFR calc Af Amer: 46 mL/min — ABNORMAL LOW (ref >60)
GFR, EST NON AFRICAN AMERICAN: 40 mL/min — AB (ref >60)
Glucose, Bld: 140 mg/dL — ABNORMAL HIGH (ref 65–99)
POTASSIUM: 3.5 mmol/L (ref 3.5–5.1)
Sodium: 136 mmol/L (ref 135–145)

## 2016-12-17 ENCOUNTER — Telehealth: Payer: Self-pay

## 2016-12-17 NOTE — Telephone Encounter (Signed)
Per review of Pt charted, Pt referred to PCP on 11/22/2016 for "Her labs are ok except the her hgb has dropped. Ask her to followup with her primary md. GT".  Labs ordered on 12/06/2016 not ordered by Dr. [name]. Per [facility] it appears PCP may have ordered under Dr. [name] name in error.  Follow up labs ordered on 12/06/2016 were ordered by [facility] nursing under Dr. [name] in error. Spoke with [facility]. Ensured no further labs ordered on Dr. [name].  No further action at this time.

## 2017-01-02 DIAGNOSIS — M160 Bilateral primary osteoarthritis of hip: Secondary | ICD-10-CM | POA: Diagnosis not present

## 2017-01-02 DIAGNOSIS — I480 Paroxysmal atrial fibrillation: Secondary | ICD-10-CM | POA: Diagnosis not present

## 2017-01-02 DIAGNOSIS — I2510 Atherosclerotic heart disease of native coronary artery without angina pectoris: Secondary | ICD-10-CM | POA: Diagnosis not present

## 2017-01-02 DIAGNOSIS — I10 Essential (primary) hypertension: Secondary | ICD-10-CM | POA: Diagnosis not present

## 2017-01-02 DIAGNOSIS — D649 Anemia, unspecified: Secondary | ICD-10-CM | POA: Diagnosis not present

## 2017-01-02 DIAGNOSIS — Z0000 Encounter for general adult medical examination without abnormal findings: Secondary | ICD-10-CM | POA: Diagnosis not present

## 2017-01-02 DIAGNOSIS — Z1389 Encounter for screening for other disorder: Secondary | ICD-10-CM | POA: Diagnosis not present

## 2017-01-03 ENCOUNTER — Encounter: Payer: Self-pay | Admitting: Internal Medicine

## 2017-01-27 ENCOUNTER — Encounter: Payer: Self-pay | Admitting: Cardiovascular Disease

## 2017-01-27 ENCOUNTER — Ambulatory Visit (INDEPENDENT_AMBULATORY_CARE_PROVIDER_SITE_OTHER): Payer: Medicare Other | Admitting: Cardiovascular Disease

## 2017-01-27 VITALS — BP 138/64 | HR 104 | Ht 63.0 in | Wt 140.0 lb

## 2017-01-27 DIAGNOSIS — Z79899 Other long term (current) drug therapy: Secondary | ICD-10-CM | POA: Diagnosis not present

## 2017-01-27 DIAGNOSIS — I480 Paroxysmal atrial fibrillation: Secondary | ICD-10-CM

## 2017-01-27 DIAGNOSIS — R531 Weakness: Secondary | ICD-10-CM | POA: Diagnosis not present

## 2017-01-27 DIAGNOSIS — I5032 Chronic diastolic (congestive) heart failure: Secondary | ICD-10-CM | POA: Diagnosis not present

## 2017-01-27 DIAGNOSIS — I350 Nonrheumatic aortic (valve) stenosis: Secondary | ICD-10-CM

## 2017-01-27 DIAGNOSIS — I10 Essential (primary) hypertension: Secondary | ICD-10-CM | POA: Diagnosis not present

## 2017-01-27 DIAGNOSIS — Z950 Presence of cardiac pacemaker: Secondary | ICD-10-CM | POA: Diagnosis not present

## 2017-01-27 DIAGNOSIS — I25118 Atherosclerotic heart disease of native coronary artery with other forms of angina pectoris: Secondary | ICD-10-CM

## 2017-01-27 NOTE — Progress Notes (Signed)
SUBJECTIVE: The patient presents follow-up of chronic diastolic heart failure, aortic stenosis, CAD, and paroxysmal atrial fibrillation.  She is not an anticoagulationcandidate.  Echocardiogram on 03/10/15 demonstrated vigorous left ventricular systolic function, EF 65-70%, grade 2 diastolic dysfunction with elevated filling pressures, severe asymmetric septal hypertrophy with some subvalvular disease with no significant gradient at rest, mild-to-moderate aortic stenosis with mild aortic regurgitation, mild mitral regurgitation , moderately reduced right ventricular systolic function, and severe tricuspid regurgitation with moderately increased pulmonary pressures, 53 mmHg.  I personally reviewed her labs.  CBC on 11/22/16 showed hemoglobin 11.1.  TSH was normal at 2.6.  BUN 20, creatinine 1.14 on 12/11/16.  She is doing well overall.  Dr. [name] increase Lasix to 40 mg twice daily on November 2.  Her ankle swelling has resolved.  She weighs herself daily.  Her daughter says she does not drink enough water.  She had a good Christmas as all of her children and grandchildren came to visit with her.  Soc Hx: She has 7 children who live in [place], [place], and [place]. She has several grandchildren and great grandchildren.   Review of Systems: As per "subjective", otherwise negative.  No Known Allergies  Current Outpatient Medications  Medication Sig Dispense Refill  . acetaminophen (TYLENOL) 500 MG tablet Take 500 mg by mouth at bedtime.    Marland Kitchen amiodarone (PACERONE) 200 MG tablet TAKE 1 TABLET(200 MG) BY MOUTH DAILY 90 tablet 2  . amLODipine (NORVASC) 5 MG tablet Take 1 tablet (5 mg total) by mouth daily. 90 tablet 3  . aspirin EC 81 MG tablet Take 1 tablet (81 mg total) by mouth daily. 90 tablet 3  . ferrous sulfate 325 (65 FE) MG EC tablet Take 325 mg by mouth 3 (three) times daily.     . fluticasone (FLONASE) 50 MCG/ACT nasal spray Place 2 sprays into both nostrils daily.      . furosemide (LASIX) 40 MG tablet Take 1 tablet (40 mg total) by mouth 2 (two) times daily. 60 tablet 11  . lisinopril (PRINIVIL,ZESTRIL) 40 MG tablet Take 1 tablet (40 mg total) by mouth daily. 90 tablet 0  . loratadine (CLARITIN) 10 MG tablet Take 1 tablet by mouth daily as needed for allergies  0  . pantoprazole (PROTONIX) 40 MG tablet Take 40 mg by mouth daily.    . potassium chloride SA (K-DUR,KLOR-CON) 20 MEQ tablet TAKE 1 TABLET BY MOUTH DAILY 90 tablet 3   No current facility-administered medications for this visit.     Past Medical History:  Diagnosis Date  . Anemia   . Anxiety   . Atrial fibrillation (HCC)   . DJD (degenerative joint disease)   . Hypertension   . Osteoporosis   . Seasonal allergies   . Sleep apnea     Past Surgical History:  Procedure Laterality Date  . CHOLECYSTECTOMY    . ESOPHAGOGASTRODUODENOSCOPY N/A 05/16/2015   Procedure: ESOPHAGOGASTRODUODENOSCOPY (EGD);  Surgeon: West Bali, MD;  Location: [facility];  Service: Endoscopy;  Laterality: N/A;  . HEMORRHOID SURGERY    . PACEMAKER INSERTION    . PPM GENERATOR CHANGEOUT N/A 08/02/2016   Procedure: PPM Generator Changeout;  Surgeon: Marinus Maw, MD;  Location: [facility] INVASIVE CV LAB;  Service: Cardiovascular;  Laterality: N/A;  . TOTAL HIP ARTHROPLASTY      Social History   Socioeconomic History  . Marital status: Widowed    Spouse name: Not on file  . Number of children:  Not on file  . Years of education: Not on file  . Highest education level: Not on file  Social Needs  . Financial resource strain: Not on file  . Food insecurity - worry: Not on file  . Food insecurity - inability: Not on file  . Transportation needs - medical: Not on file  . Transportation needs - non-medical: Not on file  Occupational History  . Not on file  Tobacco Use  . Smoking status: Never Smoker  . Smokeless tobacco: Never Used  Substance and Sexual Activity  . Alcohol use: No    Alcohol/week: 0.0 oz   . Drug use: No  . Sexual activity: Not on file  Other Topics Concern  . Not on file  Social History Narrative   Widowed   Daughter     Vitals:   01/27/17 1022  BP: 138/64  Pulse: (!) 104  SpO2: 90%  Weight: 140 lb (63.5 kg)  Height: 5\' 3"  (1.6 m)    Wt Readings from Last 3 Encounters:  01/27/17 140 lb (63.5 kg)  11/22/16 147 lb (66.7 kg)  10/28/16 149 lb (67.6 kg)     PHYSICAL EXAM General: NAD HEENT: Normal. Neck: No JVD, no thyromegaly. Lungs: Clear to auscultation bilaterally with normal respiratory effort. CV: Regular rate and rhythm, normal S1/S2, no S3/S4, 2/6 systolic murmur loudest over RUSB, but heard along left sternal border as well (unchanged). No pretibial or periankle edema.    Abdomen: Soft, nontender, no distention.  Neurologic: Alert and oriented.  Psych: Normal affect. Skin: Normal. Musculoskeletal: No gross deformities.    ECG: Most recent ECG reviewed.   Labs: Lab Results  Component Value Date/Time   K 3.5 12/11/2016 10:38 AM   BUN 20 12/11/2016 10:38 AM   BUN 18 07/30/2016 12:39 PM   CREATININE 1.14 (H) 12/11/2016 10:38 AM   ALT 36 01/27/2016 09:13 PM   TSH 2.676 11/22/2016 10:40 AM   HGB 11.1 (L) 11/22/2016 10:40 AM   HGB 13.1 07/30/2016 12:39 PM     Lipids: No results found for: LDLCALC, LDLDIRECT, CHOL, TRIG, HDL     ASSESSMENT AND PLAN:  1. CAD: Currently on amiodarone and aspirin. Not on statin. Low risk nuclear stress test (04/12/15). No changes to therapy.   2. Symptomatic bradycardia s/p pacemaker: She takes amiodarone for paroxysmal atrial fibrillation and bigeminal PVCs. Normal device function when last checked. Follows with EP.  3. Essential HTN:  Controlled.  No changes to therapy.  4. Chronic diastolic heart failure: Currently on Lasix 40 mg twice daily. She also wears compression stockings.   I told her daughter to weigh her mother daily.  I also said she can try cutting back Lasix to 40 mg daily and take it  twice daily for significant leg swelling and/or shortness of breath with weight gain.  I instructed her to take extra potassium on those days.  Most recent metabolic panel reviewed above.  5. Aortic stenosis: Mild to moderate when last assessed. Will continue to monitor.  6. Paroxysmal atrial fibrillation:Stable on amiodarone. Not an anticoagulation candidate.     Disposition: Follow up 6 months   Prentice Docker, M.D., F.A.C.C.

## 2017-01-27 NOTE — Patient Instructions (Signed)
Your physician wants you to follow-up in:  6 months with Dr.Konesqwaran You will receive a reminder letter in the mail two months in advance. If you don't receive a letter, please call our office to schedule the follow-up appointment.    Your physician recommends that you continue on your current medications as directed. Please refer to the Current Medication list given to you today.    If you need a refill on your cardiac medications before your next appointment, please call your pharmacy.     No lab work or tests ordered today.     Thank you for choosing [facility] !

## 2017-02-01 IMAGING — CR DG CHEST 1V PORT
1 series · 1 of 1 positions shown · non-contrast
Comparison: 01/19/2009

CLINICAL DATA: weakness

EXAM:
PORTABLE CHEST - 1 VIEW

[ap portable]
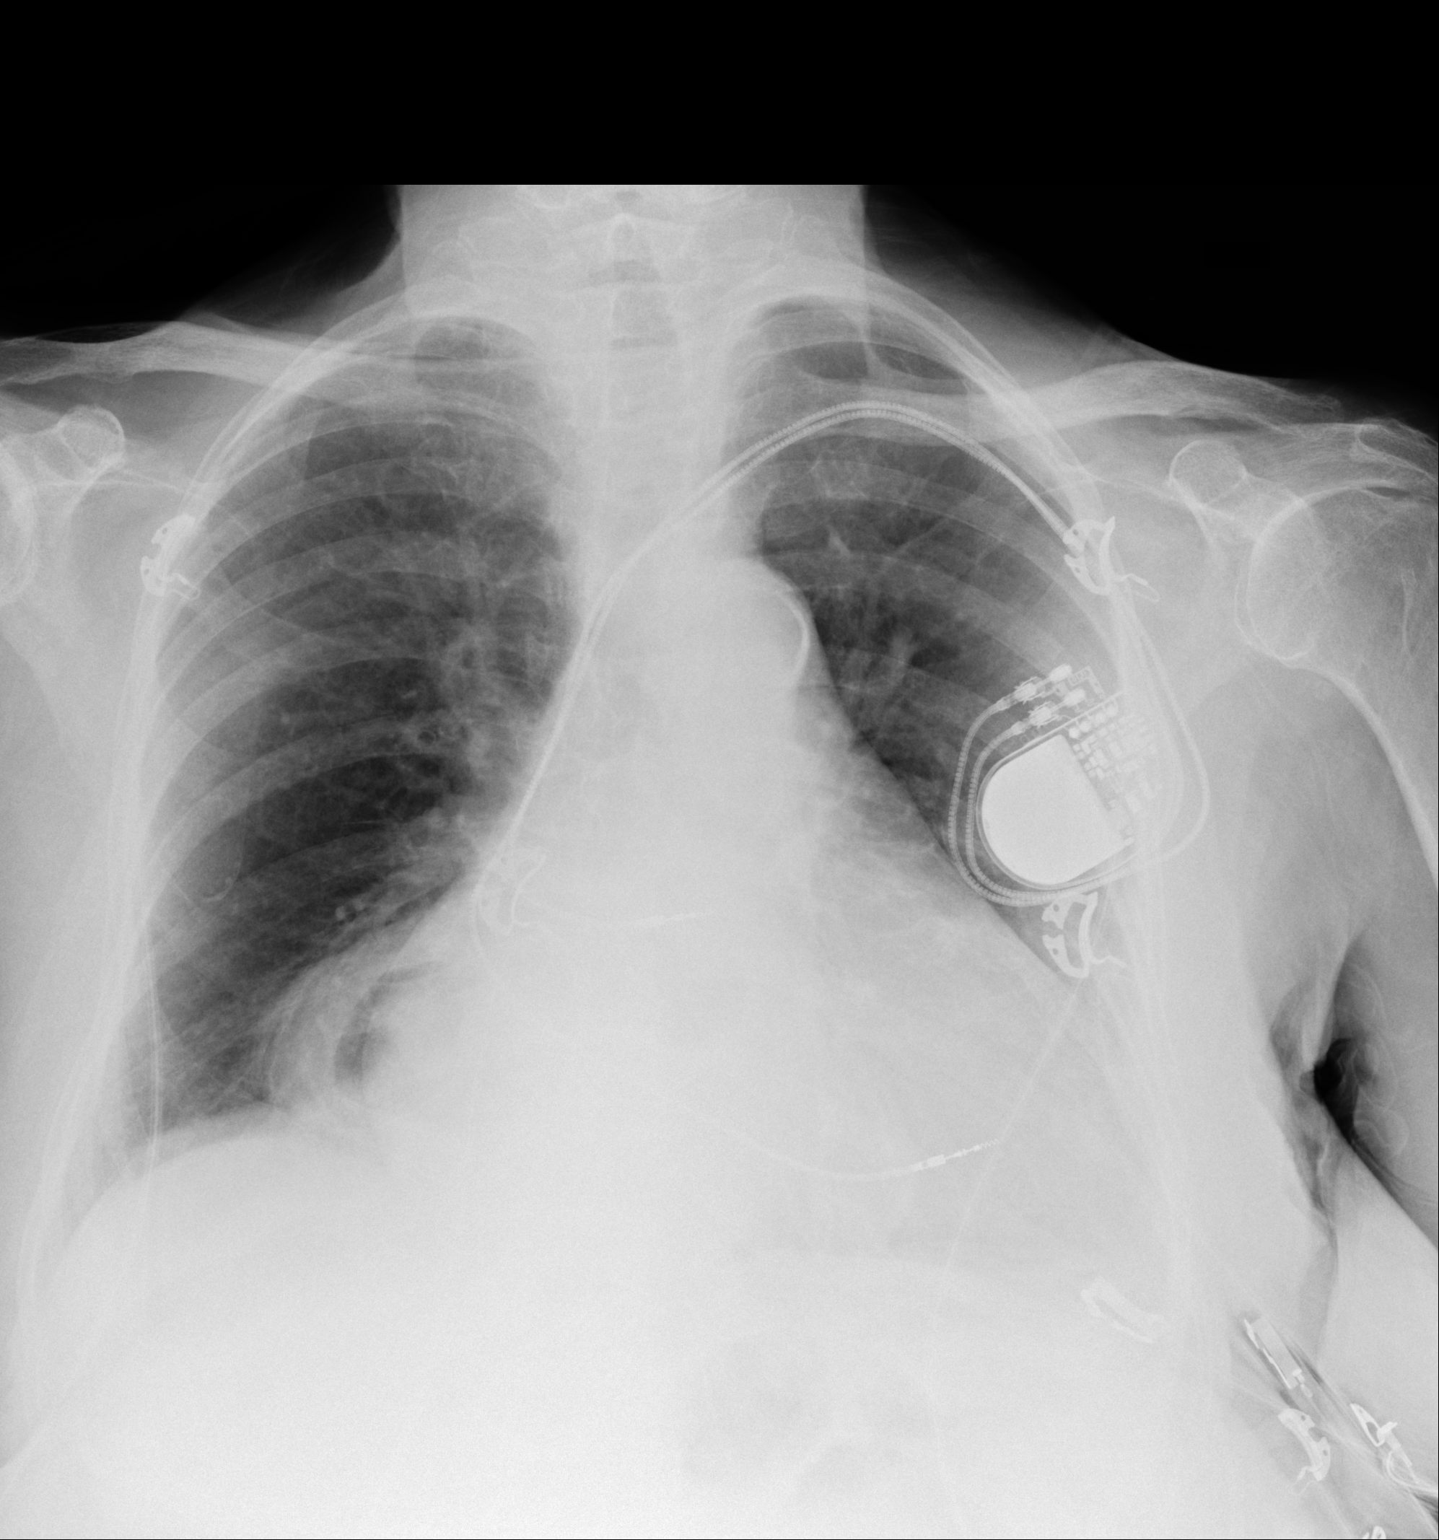

[1 of 1 positions shown; findings below may reference images not displayed]

FINDINGS: Stable left subclavian pacemaker. Moderate cardiomegaly. Large
hiatal hernia with fluid level. Atheromatous aorta. Subsegmental
atelectasis or early infiltrate at the left lung base partially
obscures the left diaphragmatic leaflet. Can't exclude small left
pleural effusion.
No pneumothorax.
Visualized skeletal structures are unremarkable.
IMPRESSION: 1. Stable cardiomegaly
2. Large hiatal hernia
3. Left lower lobe patchy atelectasis versus early infiltrate. Can't
exclude small left effusion.

## 2017-02-06 ENCOUNTER — Other Ambulatory Visit: Payer: Self-pay | Admitting: Internal Medicine

## 2017-02-10 IMAGING — CR DG CHEST 1V PORT
1 series · 1 of 1 positions shown · non-contrast
Comparison: 05/14/2015

CLINICAL DATA: Patient aspirated at breakfast yesterday and has a
cough since then. Generalized weakness.

EXAM:
PORTABLE CHEST 1 VIEW

[ap]
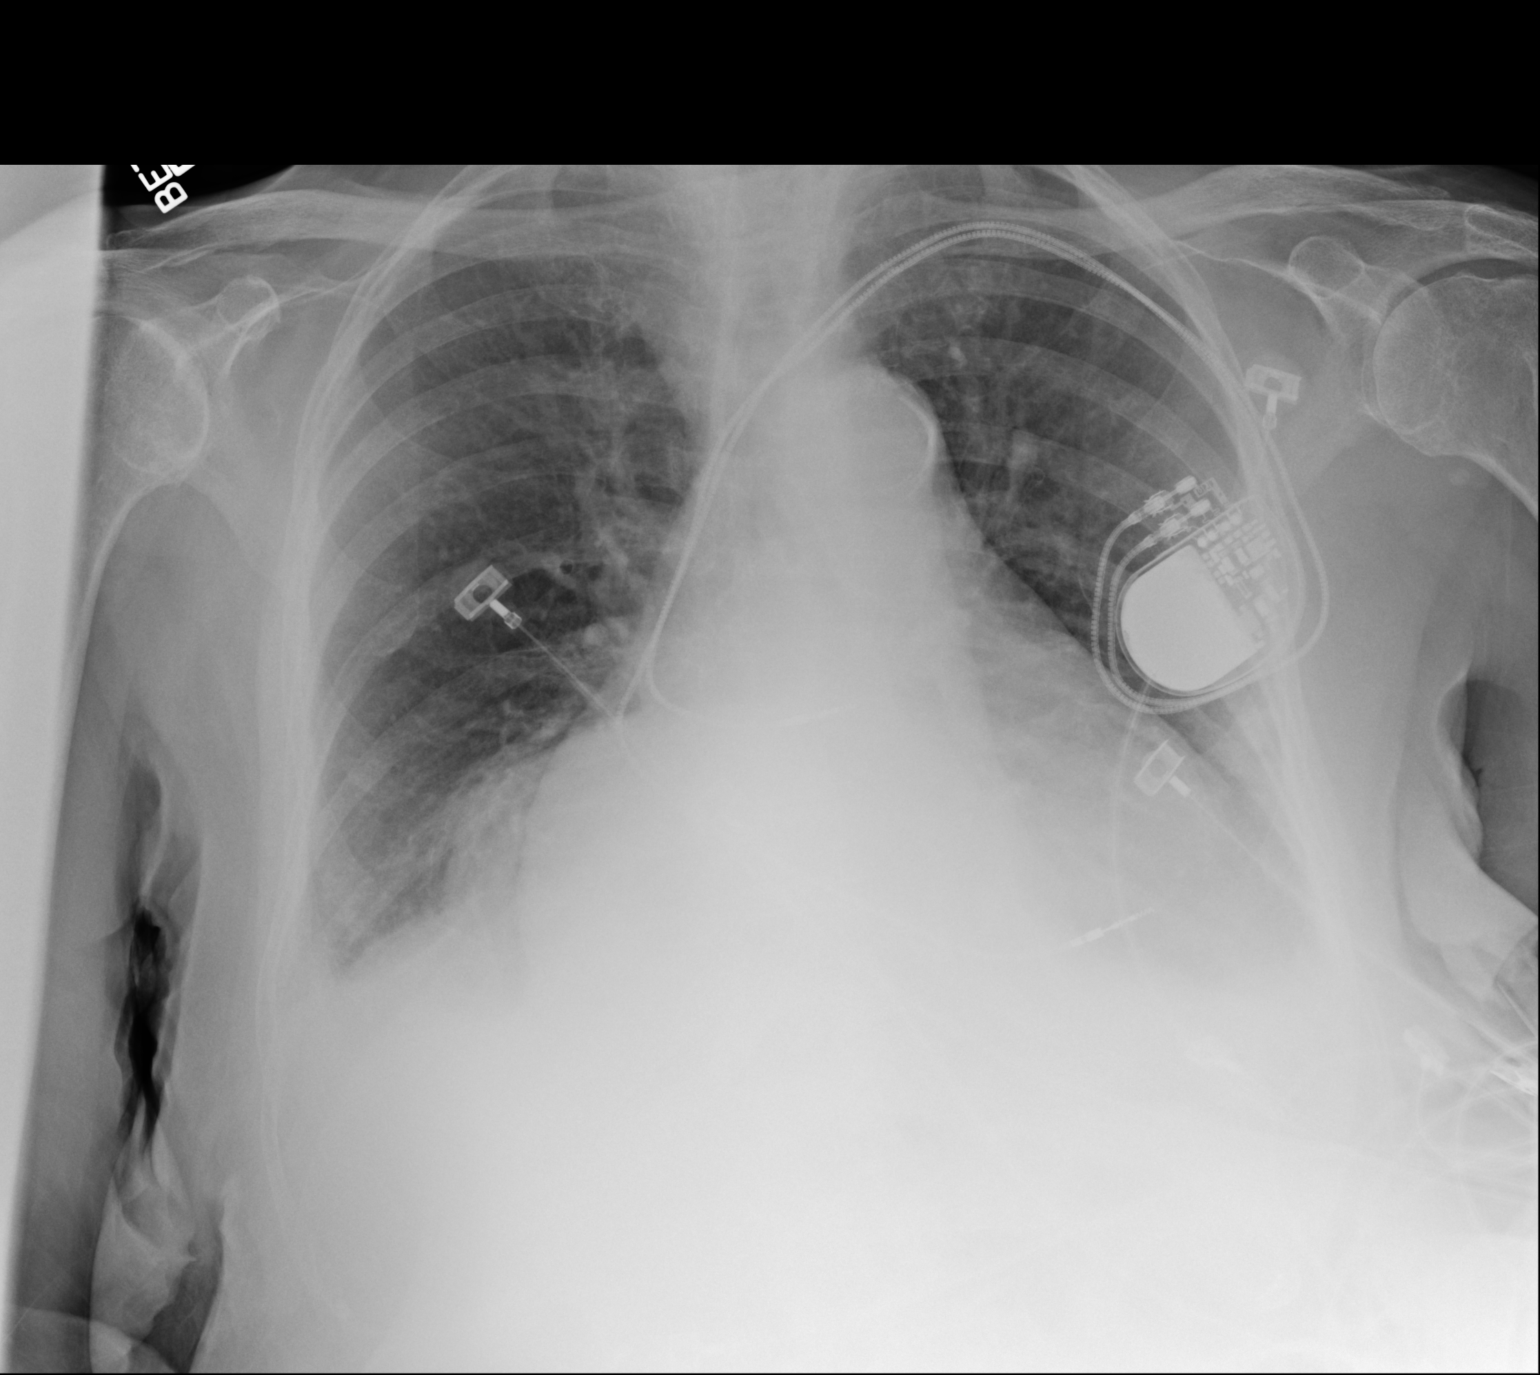

[1 of 1 positions shown; findings below may reference images not displayed]

FINDINGS: Cardiac pacemaker. Diffuse enlargement of the cardiac silhouette
which could indicate cardiac enlargement or pericardial effusion. No
significant vascular congestion. There are small bilateral pleural
effusions with atelectasis or infiltration suggested in both lung
bases. Changes could indicate pneumonia. Large esophageal hiatal
hernia behind the heart. Calcified and tortuous aorta. No
pneumothorax.
IMPRESSION: Enlarged cardiac silhouette. Small bilateral pleural effusions with
basilar atelectasis or infiltration. Large esophageal hiatal hernia
behind the heart.

## 2017-02-16 IMAGING — CR DG CHEST 1V
1 series · 1 of 1 positions shown · non-contrast
Comparison: 05/23/2015

CLINICAL DATA: Pneumonia

EXAM:
CHEST 1 VIEW

[ap portable]
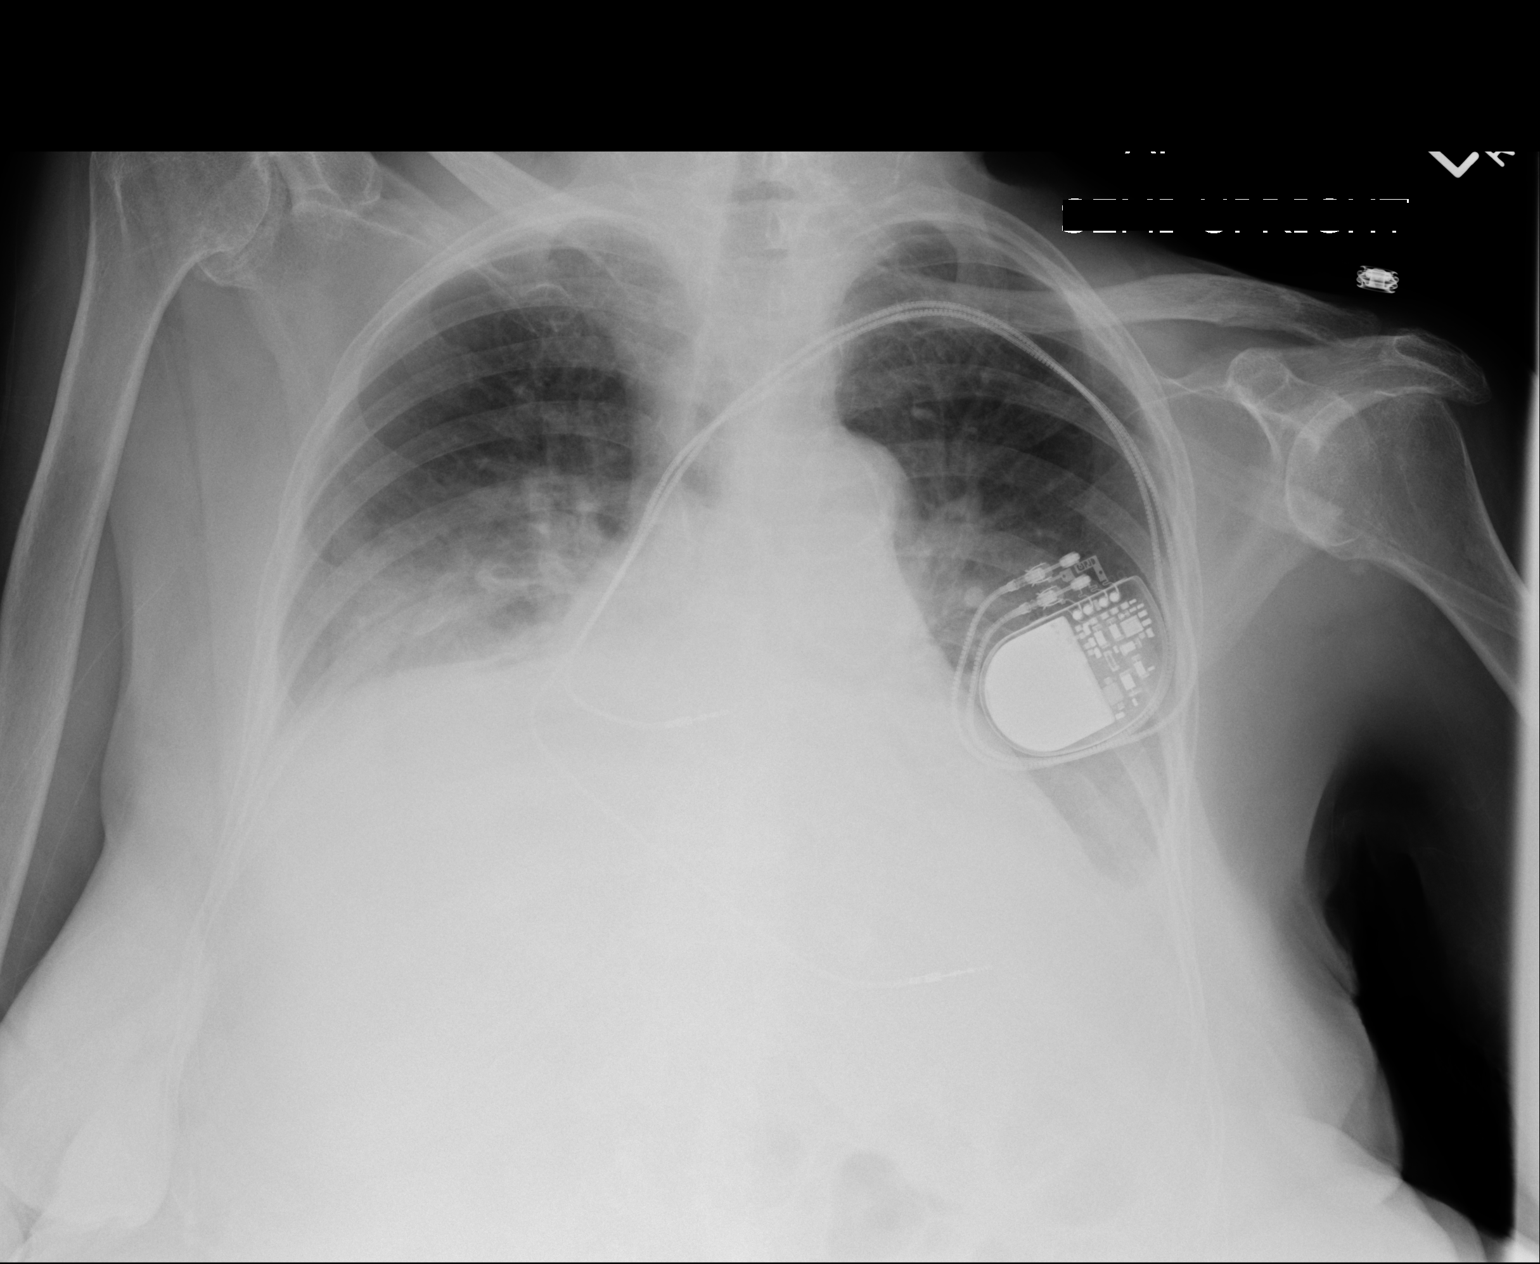

[1 of 1 positions shown; findings below may reference images not displayed]

FINDINGS: Cardiomegaly again noted. Large hiatal hernia again noted. Small
bilateral pleural effusion with bilateral basilar hazy atelectasis
or infiltrate. Slight worsening from prior exam. Dual lead cardiac
pacemaker is unchanged in position. Central vascular congestion
without convincing pulmonary edema
IMPRESSION: Cardiomegaly. Large hiatal hernia again noted. Small bilateral
pleural effusion. Hazy bilateral basilar atelectasis or infiltrate
with worsening from prior exam. Central vascular congestion without
convincing pulmonary edema.

## 2017-02-19 IMAGING — CR DG CHEST 1V PORT
1 series · 1 of 1 positions shown · non-contrast
Comparison: May 2015 and May 23, 2015

CLINICAL DATA: Recent pneumonia

EXAM:
PORTABLE CHEST 1 VIEW

[ap portable]
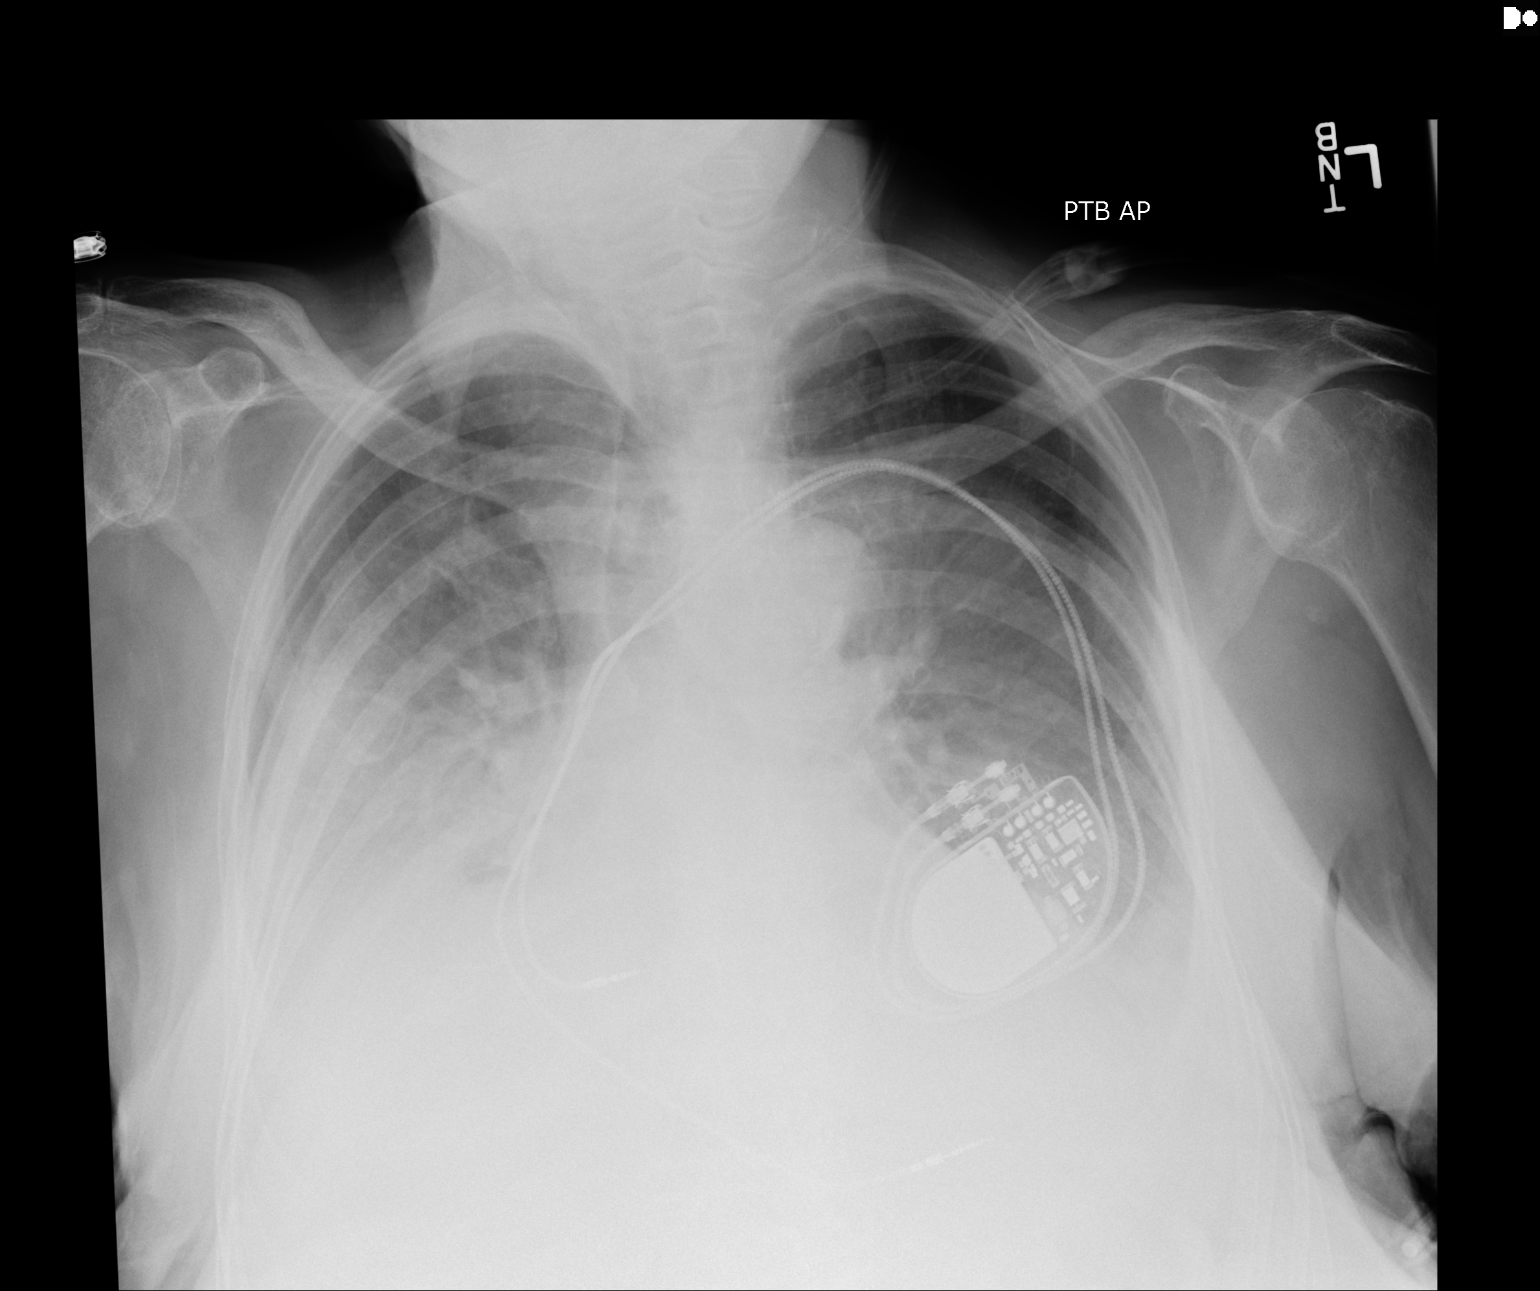

[1 of 1 positions shown; findings below may reference images not displayed]

FINDINGS: There is a sizable hiatal hernia, better seen on recent prior
studies. Heart is mildly enlarged with pulmonary venous
hypertension. There are bilateral pleural effusions, stable on the
right is slightly larger on the left. There is mild interstitial
edema. There is atelectatic change in the bases. There is
questionable airspace consolidation in the bases as well. Pacemaker
leads are attached to the right atrium and right ventricle. No
pneumothorax. No adenopathy evident.
IMPRESSION: Findings consistent with a degree of congestive heart failure.
Superimposed pneumonia in the bases cannot be excluded. Hiatal
hernia present. Cardiac silhouette and pacemaker lead positions are
stable.

## 2017-02-24 ENCOUNTER — Ambulatory Visit (INDEPENDENT_AMBULATORY_CARE_PROVIDER_SITE_OTHER): Payer: Medicare Other | Admitting: *Deleted

## 2017-02-24 DIAGNOSIS — I442 Atrioventricular block, complete: Secondary | ICD-10-CM | POA: Diagnosis not present

## 2017-02-24 NOTE — Progress Notes (Signed)
Remote pacemaker transmission.   

## 2017-02-25 ENCOUNTER — Encounter: Payer: Self-pay | Admitting: Cardiology

## 2017-02-27 ENCOUNTER — Ambulatory Visit (INDEPENDENT_AMBULATORY_CARE_PROVIDER_SITE_OTHER): Payer: Medicare Other | Admitting: Nurse Practitioner

## 2017-02-27 ENCOUNTER — Encounter: Payer: Self-pay | Admitting: Nurse Practitioner

## 2017-02-27 VITALS — BP 157/77 | HR 77 | Temp 97.0°F | Ht 63.0 in | Wt 140.0 lb

## 2017-02-27 DIAGNOSIS — I25118 Atherosclerotic heart disease of native coronary artery with other forms of angina pectoris: Secondary | ICD-10-CM | POA: Diagnosis not present

## 2017-02-27 DIAGNOSIS — K922 Gastrointestinal hemorrhage, unspecified: Secondary | ICD-10-CM | POA: Diagnosis not present

## 2017-02-27 DIAGNOSIS — R1310 Dysphagia, unspecified: Secondary | ICD-10-CM | POA: Diagnosis not present

## 2017-02-27 NOTE — Progress Notes (Signed)
cc'ed to pcp °

## 2017-02-27 NOTE — Assessment & Plan Note (Signed)
Very pleasant 84 year old woman who presents on referral from primary care for dysphasia.  After discussing with the patient and her daughter seems her symptoms are not exactly solid food dysphagia or pill dysphagia.  She does get "choked easily."  Additionally, she has a lot of "coughing up thick phlegm."  She does have a history of seasonal allergies and is on second generation antihistamine as well as nasal spray.  She does have a history of aspiration pneumonia.  At this point we will plan for barium pill esophagram to further evaluate for functional issues versus any stricture.  This also let us evaluate for risk for aspiration.  If it is deemed to be a GI functional issue we can consider referral to speech times pathology and/or dietary recommendations.  If it is not a GI issue we can refer her back to primary care versus ENT for further evaluation of upper respiratory type symptoms.  Follow-up in 2 months.

## 2017-02-27 NOTE — Assessment & Plan Note (Signed)
History of GI bleed in 2017.  She has done quite well since then.  She is no longer on anticoagulation due to high risk for bleed.  She denies any hematochezia.  She does have dark stools on iron but they are not "sticky."  Doubt melena at this point.  Overall, she is doing well.  Continue to monitor and notify us of any GI bleeding.  Follow-up in 2 months.

## 2017-02-27 NOTE — Patient Instructions (Signed)
1. We will help schedule your swallowing test for you. 2. Continue your current medications. 3. Follow-up in 2 months. 4. Call us if you have any questions or concerns.

## 2017-02-27 NOTE — Progress Notes (Signed)
Referring Provider: Avon Gully, MD Primary Care Physician:  Avon Gully, MD Primary GI:  Dr. Jena Gauss  Chief Complaint  Patient presents with  . Dysphagia    consult. Swallowing makes her lose her breathe. Gets tickle in back of throat and starts coughing    HPI:   [name] is a 84 y.o. female who presents on referral from primary care for dysphasia.  The patient has not been seen in our office but was seen in consult during hospital admission for GI bleed.  She was admitted from 05/14/2015 through 05/19/2015 for severe anemia and melena.  Hemoglobin on admission was 5.3.  She was transfused.  EGD did not show any acute bleeding.  Anticoagulation was stopped and she was discharged on Protonix twice daily due to gastritis.  Patient was readmitted from 05/22/2015 through 06/03/2015 for aspiration pneumonia and hypoxia requiring 15 L of oxygen.  She had re-aspiration during the hospital stay.  Palliative care was provided and patient was discharged with hospice service.  After her initial hospitalization she was recommended to follow-up in our office in June 2017 for dysphasia/GI bleed.  He did not show for her appointment.  Labs were faxed to hospice to be drawn.  Today she states she's doing ok overall. Has "something like phlegm rising in my throat which causes me to cough like a cold, but I don't have a cold." Denies any sold food dysphagia or pill dysphagia. However, her daughter states she can "swallow and get strangled easily, but it's usually along with that thick mucus that's there." Denies dyspepsia symptoms (esophageal burning, bitter taste). Denies abdominal pain, N/V, hematochezia. Has dark stools on iron, but not "sticky." Denies fever, chills, unintentional weight loss. Occasional chest pain and intermittent fatigue/dyspnea. Denies dizziness, lightheadedness, syncope, near syncope. Denies any other upper or lower GI symptoms.  Uses Claratin "on occasion." Uses fluticasone  nasal spray every night.  Past Medical History:  Diagnosis Date  . Anemia   . Anxiety   . Atrial fibrillation (HCC)   . DJD (degenerative joint disease)   . Hypertension   . Osteoporosis   . Seasonal allergies   . Sleep apnea     Past Surgical History:  Procedure Laterality Date  . CHOLECYSTECTOMY    . ESOPHAGOGASTRODUODENOSCOPY N/A 05/16/2015   Procedure: ESOPHAGOGASTRODUODENOSCOPY (EGD);  Surgeon: West Bali, MD;  Location: [facility];  Service: Endoscopy;  Laterality: N/A;  . HEMORRHOID SURGERY    . PACEMAKER INSERTION    . PPM GENERATOR CHANGEOUT N/A 08/02/2016   Procedure: PPM Generator Changeout;  Surgeon: Marinus Maw, MD;  [facility] INVASIVE CV LAB;  Service: Cardiovascular;  Laterality: N/A;  . TOTAL HIP ARTHROPLASTY      Current Outpatient Medications  Medication Sig Dispense Refill  . acetaminophen (TYLENOL) 500 MG tablet Take 500 mg by mouth at bedtime.    Marland Kitchen amiodarone (PACERONE) 200 MG tablet TAKE 1 TABLET(200 MG) BY MOUTH DAILY 90 tablet 2  . amLODipine (NORVASC) 5 MG tablet Take 1 tablet (5 mg total) by mouth daily. 90 tablet 3  . aspirin EC 81 MG tablet Take 1 tablet (81 mg total) by mouth daily. 90 tablet 3  . ferrous sulfate 325 (65 FE) MG EC tablet Take 325 mg by mouth 3 (three) times daily.     . fluticasone (FLONASE) 50 MCG/ACT nasal spray Place 2 sprays into both nostrils daily.    . furosemide (LASIX) 40 MG tablet Take 1 tablet (40 mg  total) by mouth 2 (two) times daily. 60 tablet 11  . lisinopril (PRINIVIL,ZESTRIL) 40 MG tablet Take 1 tablet (40 mg total) by mouth daily. 90 tablet 0  . loratadine (CLARITIN) 10 MG tablet Take 1 tablet by mouth daily as needed for allergies  0  . pantoprazole (PROTONIX) 40 MG tablet Take 40 mg by mouth daily.    . potassium chloride SA (K-DUR,KLOR-CON) 20 MEQ tablet TAKE 1 TABLET BY MOUTH DAILY 90 tablet 2   No current facility-administered medications for this visit.     Allergies as of 02/27/2017  . (No  Known Allergies)    Family History  Problem Relation Age of Onset  . Colon cancer Father   . Colon cancer Paternal Aunt   . Crohn's disease Son     Social History   Socioeconomic History  . Marital status: Widowed    Spouse name: None  . Number of children: None  . Years of education: None  . Highest education level: None  Social Needs  . Financial resource strain: None  . Food insecurity - worry: None  . Food insecurity - inability: None  . Transportation needs - medical: None  . Transportation needs - non-medical: None  Occupational History  . None  Tobacco Use  . Smoking status: Never Smoker  . Smokeless tobacco: Never Used  Substance and Sexual Activity  . Alcohol use: No    Alcohol/week: 0.0 oz  . Drug use: No  . Sexual activity: None  Other Topics Concern  . None  Social History Narrative   Widowed   Daughter    Review of Systems: General: Negative for anorexia, weight loss, fever, chills, fatigue, weakness. Eyes: Negative for vision changes.  ENT: Negative for hoarseness, difficulty swallowing , nasal congestion. CV: Negative for chest pain, angina, palpitations, dyspnea on exertion, peripheral edema.  Respiratory: Negative for dyspnea at rest, dyspnea on exertion, cough, sputum, wheezing.  GI: See history of present illness. GU:  Negative for dysuria, hematuria, urinary incontinence, urinary frequency, nocturnal urination.  MS: Negative for joint pain, low back pain.  Derm: Negative for rash or itching.  Neuro: Negative for weakness, abnormal sensation, seizure, frequent headaches, memory loss, confusion.  Psych: Negative for anxiety, depression, suicidal ideation, hallucinations.  Endo: Negative for unusual weight change.  Heme: Negative for bruising or bleeding. Allergy: Negative for rash or hives.   Physical Exam: BP (!) 157/77   Pulse 77   Temp (!) 97 F (36.1 C) (Oral)   Ht 5\' 3"  (1.6 m)   Wt 140 lb (63.5 kg)   BMI 24.80 kg/m  General:    Alert and oriented. Pleasant and cooperative. Well-nourished and well-developed.  Head:  Normocephalic and atraumatic. Eyes:  Without icterus, sclera clear and conjunctiva pink.  Ears:  Normal auditory acuity. Mouth:  No deformity or lesions, oral mucosa pink.  Throat/Neck:  Supple, without mass or thyromegaly. Cardiovascular:  S1, S2 present without murmurs appreciated. Normal pulses noted. Extremities without clubbing or edema. Respiratory:  Clear to auscultation bilaterally. No wheezes, rales, or rhonchi. No distress.  Gastrointestinal:  +BS, soft, non-tender and non-distended. No HSM noted. No guarding or rebound. No masses appreciated.  Rectal:  Deferred  Musculoskalatal:  Symmetrical without gross deformities. Normal posture. Skin:  Intact without significant lesions or rashes. Neurologic:  Alert and oriented x4;  grossly normal neurologically. Psych:  Alert and cooperative. Normal mood and affect. Heme/Lymph/Immune: No significant cervical adenopathy. No excessive bruising noted.    02/27/2017 11:33 AM  Disclaimer: This note was dictated with voice recognition software. Similar sounding words can inadvertently be transcribed and may not be corrected upon review.

## 2017-03-05 ENCOUNTER — Ambulatory Visit (HOSPITAL_COMMUNITY)
Admission: RE | Admit: 2017-03-05 | Discharge: 2017-03-05 | Disposition: A | Discharge status: Home / Self Care | Payer: Medicare Other | Source: Ambulatory Visit | Attending: Nurse Practitioner | Admitting: Nurse Practitioner

## 2017-03-05 DIAGNOSIS — K219 Gastro-esophageal reflux disease without esophagitis: Secondary | ICD-10-CM | POA: Diagnosis not present

## 2017-03-05 DIAGNOSIS — K922 Gastrointestinal hemorrhage, unspecified: Secondary | ICD-10-CM | POA: Insufficient documentation

## 2017-03-05 DIAGNOSIS — R1310 Dysphagia, unspecified: Secondary | ICD-10-CM | POA: Insufficient documentation

## 2017-03-05 DIAGNOSIS — K224 Dyskinesia of esophagus: Secondary | ICD-10-CM | POA: Insufficient documentation

## 2017-03-05 DIAGNOSIS — K449 Diaphragmatic hernia without obstruction or gangrene: Secondary | ICD-10-CM | POA: Diagnosis not present

## 2017-03-10 ENCOUNTER — Ambulatory Visit (INDEPENDENT_AMBULATORY_CARE_PROVIDER_SITE_OTHER): Payer: Medicare Other | Admitting: Internal Medicine

## 2017-03-10 ENCOUNTER — Encounter: Payer: Self-pay | Admitting: Internal Medicine

## 2017-03-10 VITALS — BP 128/78 | HR 104 | Ht 62.0 in | Wt 142.0 lb

## 2017-03-10 DIAGNOSIS — I480 Paroxysmal atrial fibrillation: Secondary | ICD-10-CM | POA: Diagnosis not present

## 2017-03-10 DIAGNOSIS — I25118 Atherosclerotic heart disease of native coronary artery with other forms of angina pectoris: Secondary | ICD-10-CM

## 2017-03-10 DIAGNOSIS — I5032 Chronic diastolic (congestive) heart failure: Secondary | ICD-10-CM

## 2017-03-10 MED ORDER — AMIODARONE HCL 200 MG PO TABS: ORAL_TABLET | ORAL | 3 refills | Status: DC

## 2017-03-10 NOTE — Progress Notes (Signed)
HPI [name] returns today for ongoing evaluation and management of her permanent pacemaker in the setting of complete heart block, in the setting of chronic diastolic heart failure. In the interim she has been treated with some oral lasix and her sob has improved. She underwent pacemaker generator change out approximately 8 months ago. No syncope. Her chronic peripheral edema has improved. She denies anginal symptoms. No Known Allergies   Current Outpatient Medications  Medication Sig Dispense Refill  . acetaminophen (TYLENOL) 500 MG tablet Take 500 mg by mouth at bedtime.    Marland Kitchen amiodarone (PACERONE) 200 MG tablet TAKE 1 TABLET(200 MG) BY MOUTH DAILY 90 tablet 2  . amLODipine (NORVASC) 5 MG tablet Take 1 tablet (5 mg total) by mouth daily. 90 tablet 3  . aspirin EC 81 MG tablet Take 1 tablet (81 mg total) by mouth daily. 90 tablet 3  . ferrous sulfate 325 (65 FE) MG EC tablet Take 325 mg by mouth 3 (three) times daily.     . fluticasone (FLONASE) 50 MCG/ACT nasal spray Place 2 sprays into both nostrils daily.    . furosemide (LASIX) 40 MG tablet Take 1 tablet (40 mg total) by mouth 2 (two) times daily. 60 tablet 11  . lisinopril (PRINIVIL,ZESTRIL) 40 MG tablet Take 1 tablet (40 mg total) by mouth daily. 90 tablet 0  . loratadine (CLARITIN) 10 MG tablet Take 1 tablet by mouth daily as needed for allergies  0  . pantoprazole (PROTONIX) 40 MG tablet Take 40 mg by mouth daily.    . potassium chloride SA (K-DUR,KLOR-CON) 20 MEQ tablet TAKE 1 TABLET BY MOUTH DAILY 90 tablet 2   No current facility-administered medications for this visit.      Past Medical History:  Diagnosis Date  . Anemia   . Anxiety   . Atrial fibrillation (HCC)   . DJD (degenerative joint disease)   . Hypertension   . Osteoporosis   . Seasonal allergies   . Sleep apnea     ROS:   All systems reviewed and negative except as noted in the HPI.   Past Surgical History:  Procedure Laterality Date  .  CHOLECYSTECTOMY    . ESOPHAGOGASTRODUODENOSCOPY N/A 05/16/2015   Procedure: ESOPHAGOGASTRODUODENOSCOPY (EGD);  Surgeon: West Bali, MD;  Location: [facility];  Service: Endoscopy;  Laterality: N/A;  . HEMORRHOID SURGERY    . PACEMAKER INSERTION    . PPM GENERATOR CHANGEOUT N/A 08/02/2016   Procedure: PPM Generator Changeout;  Surgeon: Marinus Maw, MD;  Location: [facility] INVASIVE CV LAB;  Service: Cardiovascular;  Laterality: N/A;  . TOTAL HIP ARTHROPLASTY       Family History  Problem Relation Age of Onset  . Colon cancer Father   . Colon cancer Paternal Aunt   . Crohn's disease Son      Social History   Socioeconomic History  . Marital status: Widowed    Spouse name: Not on file  . Number of children: Not on file  . Years of education: Not on file  . Highest education level: Not on file  Social Needs  . Financial resource strain: Not on file  . Food insecurity - worry: Not on file  . Food insecurity - inability: Not on file  . Transportation needs - medical: Not on file  . Transportation needs - non-medical: Not on file  Occupational History  . Not on file  Tobacco Use  . Smoking status: Never Smoker  . Smokeless  tobacco: Never Used  Substance and Sexual Activity  . Alcohol use: No    Alcohol/week: 0.0 oz  . Drug use: No  . Sexual activity: Not on file  Other Topics Concern  . Not on file  Social History Narrative   Widowed   Daughter     BP 128/78 (BP Location: Left Arm)   Pulse (!) 104   Ht 5\' 2"  (1.575 m)   Wt 142 lb (64.4 kg)   SpO2 91%   BMI 25.97 kg/m   Physical Exam:  Well appearing elderly woman, NAD HEENT: Unremarkable Neck:  6 cm JVD, no thyromegally Lymphatics:  No adenopathy Back:  No CVA tenderness Lungs:  Clear with no wheezes HEART:  Regular rate rhythm, no murmurs, no rubs, no clicks Abd:  soft, positive bowel sounds, no organomegally, no rebound, no guarding Ext:  2 plus pulses, no edema, no cyanosis, no clubbing Skin:  No  rashes no nodules Neuro:  CN II through XII intact, motor grossly intact  DEVICE  Normal device function.  See PaceArt for details.   Assess/Plan: 1. Chronic diastolic heart failure - her symptoms have improved with additional diuretic therapy. She is encouraged to maintain a low sodium diet. 2. PPM - Her Medtronic DDD PM is working normally.  3. PAF - she is maintaining NSR very nicely. I have asked the patient to reduce her dose of amiodarone.  [name].D

## 2017-03-10 NOTE — Patient Instructions (Signed)
Medication Instructions:  Your physician recommends that you continue on your current medications as directed. Please refer to the Current Medication list given to you today. Decrease Amiodarone to 1 Tablet Daily Monday -Friday and None on Saturday or Sunday.  Labwork: NONE   Testing/Procedures: NONE   Follow-Up: Your physician recommends that you schedule a follow-up appointment in: 9 Months    Any Other Special Instructions Will Be Listed Below (If Applicable).     If you need a refill on your cardiac medications before your next appointment, please call your pharmacy.  Thank you for choosing [facility]!

## 2017-03-19 LAB — CUP PACEART REMOTE DEVICE CHECK
Battery Impedance: 100 Ohm
Brady Statistic AP VP Percent: 95 %
Brady Statistic AP VS Percent: 1 %
Brady Statistic AS VP Percent: 4 %
Brady Statistic AS VS Percent: 0 %
Implantable Lead Implant Date: 20060926
Implantable Lead Model: 5076
Implantable Lead Model: 5076
Lead Channel Impedance Value: 455 Ohm
Lead Channel Impedance Value: 475 Ohm
Lead Channel Pacing Threshold Amplitude: 0.75 V
Lead Channel Pacing Threshold Amplitude: 0.75 V
Lead Channel Pacing Threshold Pulse Width: 0.4 ms
Lead Channel Setting Pacing Amplitude: 2.5 V
Lead Channel Setting Sensing Sensitivity: 4 mV
MDC IDC LEAD IMPLANT DT: 20060926
MDC IDC LEAD LOCATION: 753859
MDC IDC LEAD LOCATION: 753860
MDC IDC MSMT BATTERY REMAINING LONGEVITY: 131 mo
MDC IDC MSMT BATTERY VOLTAGE: 2.79 V
MDC IDC MSMT LEADCHNL RV PACING THRESHOLD PULSEWIDTH: 0.4 ms
MDC IDC PG IMPLANT DT: 20180713
MDC IDC SESS DTM: 20190204145418
MDC IDC SET LEADCHNL RA PACING AMPLITUDE: 1.5 V
MDC IDC SET LEADCHNL RV PACING PULSEWIDTH: 0.4 ms

## 2017-04-10 DIAGNOSIS — I2510 Atherosclerotic heart disease of native coronary artery without angina pectoris: Secondary | ICD-10-CM | POA: Diagnosis not present

## 2017-04-10 DIAGNOSIS — I509 Heart failure, unspecified: Secondary | ICD-10-CM | POA: Diagnosis not present

## 2017-04-10 DIAGNOSIS — M160 Bilateral primary osteoarthritis of hip: Secondary | ICD-10-CM | POA: Diagnosis not present

## 2017-04-10 DIAGNOSIS — I10 Essential (primary) hypertension: Secondary | ICD-10-CM | POA: Diagnosis not present

## 2017-04-28 DIAGNOSIS — M79674 Pain in right toe(s): Secondary | ICD-10-CM | POA: Diagnosis not present

## 2017-04-28 DIAGNOSIS — M79675 Pain in left toe(s): Secondary | ICD-10-CM | POA: Diagnosis not present

## 2017-04-28 DIAGNOSIS — I739 Peripheral vascular disease, unspecified: Secondary | ICD-10-CM | POA: Diagnosis not present

## 2017-04-28 DIAGNOSIS — B351 Tinea unguium: Secondary | ICD-10-CM | POA: Diagnosis not present

## 2017-05-07 ENCOUNTER — Encounter: Payer: Self-pay | Admitting: Nurse Practitioner

## 2017-05-07 ENCOUNTER — Encounter: Payer: Self-pay | Admitting: Internal Medicine

## 2017-05-07 ENCOUNTER — Ambulatory Visit (INDEPENDENT_AMBULATORY_CARE_PROVIDER_SITE_OTHER): Payer: Medicare Other | Admitting: Nurse Practitioner

## 2017-05-07 VITALS — BP 154/82 | HR 76 | Temp 97.0°F | Ht 62.0 in | Wt 143.2 lb

## 2017-05-07 DIAGNOSIS — K449 Diaphragmatic hernia without obstruction or gangrene: Secondary | ICD-10-CM | POA: Diagnosis not present

## 2017-05-07 DIAGNOSIS — K219 Gastro-esophageal reflux disease without esophagitis: Secondary | ICD-10-CM | POA: Diagnosis not present

## 2017-05-07 DIAGNOSIS — I25118 Atherosclerotic heart disease of native coronary artery with other forms of angina pectoris: Secondary | ICD-10-CM | POA: Diagnosis not present

## 2017-05-07 DIAGNOSIS — R1310 Dysphagia, unspecified: Secondary | ICD-10-CM

## 2017-05-07 NOTE — Assessment & Plan Note (Signed)
Barium pill esophagram did note a large hiatal hernia.  She is not currently a surgical candidate.  We will manage her conservatively with dysphagia diet, adequate GERD control, dietary recommendations.  Continue current medications.  She feels she is doing better overall today.  Return for follow-up in 6 months.  Call us with any worsening problems.

## 2017-05-07 NOTE — Patient Instructions (Addendum)
1. Continue taking your current medications.  Specifically, continue pantoprazole (Protonix). 2. Below I am giving you information related to foods to eat and ways to eat to help prevent symptoms related to the hernia and difficulty with food passage. 3. As we discussed, drink adequate fluids. 4. Follow-up in 6 months. 5. Call us if you have any worsening symptoms before then. 6. Call us if you have any questions or concerns   It was great to see you today! I hope you have a wonderful summer!!!    At [facility] we value your feedback. You may receive a survey about your visit today. Please share your experience as we strive to create trusting relationships with our patients to provide genuine, compassionate, quality care.     Hernia, Adult A hernia is the bulging of an organ or tissue through a weak spot in the muscles of the abdomen (abdominal wall). Hernias develop most often near the navel or groin. There are many kinds of hernias. Common kinds include:  Femoral hernia. This kind of hernia develops under the groin in the upper thigh area.  Inguinal hernia. This kind of hernia develops in the groin or scrotum.  Umbilical hernia. This kind of hernia develops near the navel.  Hiatal hernia. This kind of hernia causes part of the stomach to be pushed up into the chest.  Incisional hernia. This kind of hernia bulges through a scar from an abdominal surgery.  What are the causes? This condition may be caused by:  Heavy lifting.  Coughing over a long period of time.  Straining to have a bowel movement.  An incision made during an abdominal surgery.  A birth defect (congenital defect).  Excess weight or obesity.  Smoking.  Poor nutrition.  Cystic fibrosis.  Excess fluid in the abdomen.  Undescended testicles.  What are the signs or symptoms? Symptoms of a hernia include:  A lump on the abdomen. This is the first sign of a hernia. The lump may  become more obvious with standing, straining, or coughing. It may get bigger over time if it is not treated or if the condition causing it is not treated.  Pain. A hernia is usually painless, but it may become painful over time if treatment is delayed. The pain is usually dull and may get worse with standing or lifting heavy objects.  Sometimes a hernia gets tightly squeezed in the weak spot (strangulated) or stuck there (incarcerated) and causes additional symptoms. These symptoms may include:  Vomiting.  Nausea.  Constipation.  Irritability.  How is this diagnosed? A hernia may be diagnosed with:  A physical exam. During the exam your health care provider may ask you to cough or to make a specific movement, because a hernia is usually more visible when you move.  Imaging tests. These can include: ? X-rays. ? Ultrasound. ? CT scan.  How is this treated? A hernia that is small and painless may not need to be treated. A hernia that is large or painful may be treated with surgery. Inguinal hernias may be treated with surgery to prevent incarceration or strangulation. Strangulated hernias are always treated with surgery, because lack of blood to the trapped organ or tissue can cause it to die. Surgery to treat a hernia involves pushing the bulge back into place and repairing the weak part of the abdomen. Follow these instructions at home:  Avoid straining.  Do not lift anything heavier than 10 lb (4.5 kg).  Lift with your leg  muscles, not your back muscles. This helps avoid strain.  When coughing, try to cough gently.  Prevent constipation. Constipation leads to straining with bowel movements, which can make a hernia worse or cause a hernia repair to break down. You can prevent constipation by: ? Eating a high-fiber diet that includes plenty of fruits and vegetables. ? Drinking enough fluids to keep your urine clear or pale yellow. Aim to drink 6-8 glasses of water per  day. ? Using a stool softener as directed by your health care provider.  Lose weight, if you are overweight.  Do not use any tobacco products, including cigarettes, chewing tobacco, or electronic cigarettes. If you need help quitting, ask your health care provider.  Keep all follow-up visits as directed by your health care provider. This is important. Your health care provider may need to monitor your condition. Contact a health care provider if:  You have swelling, redness, and pain in the affected area.  Your bowel habits change. Get help right away if:  You have a fever.  You have abdominal pain that is getting worse.  You feel nauseous or you vomit.  You cannot push the hernia back in place by gently pressing on it while you are lying down.  The hernia: ? Changes in shape or size. ? Is stuck outside the abdomen. ? Becomes discolored. ? Feels hard or tender. This information is not intended to replace advice given to you by your health care provider. Make sure you discuss any questions you have with your health care provider. Document Released: 01/07/2005 Document Revised: 06/07/2015 Document Reviewed: 11/17/2013 Elsevier Interactive Patient Education  2017 [employer] Inc[employer]    Dysphagia Diet Level 3, Mechanically Advanced The dysphagia level 3 diet includes foods that are soft, moist, and can be chopped into 1-inch chunks. This diet is helpful for people with mild swallowing difficulties. It reduces the risk of food getting caught in the windpipe, trachea, or lungs. What do I need to know about this diet?  You may eat foods that are soft and moist.  If you were on the dysphagia level 1 or level 2 diets, you may eat any of the foods included on those lists.  Avoid foods that are dry, hard, sticky, chewy, coarse, and crunchy. Also avoid large cuts of food.  Take small bites. Each bite should contain 1 inch or less of food.  Thicken liquids if instructed by your health  care provider. Follow your health care provider's instructions on how to do this and to what consistency.  See your dietitian or speech language pathologist regularly for help with your dietary changes. What foods can I eat? Grains Moist breads without nuts or seeds. Biscuits, muffins, pancakes, and waffles well-moistened with syrup, jelly, margarine, or butter. Smooth cereals with plenty of milk to moisten them. Moist bread stuffing. Moist rice. Vegetables All cooked, soft vegetables. Shredded lettuce. Tender fried potatoes. Fruits All canned and cooked fruits. Soft, peeled fresh fruits, such as peaches, nectarines, kiwis, cantaloupe, honeydew melon, and watermelon without seeds. Soft berries, such as strawberries. Meat and Other Protein Sources Moist ground or finely diced or sliced meats. Solid, tender cuts of meat. Meatloaf. Hamburger with a bun. Sausage patty. Deli thin-sliced lunch meat. Chicken, egg, or tuna salad sandwich. Sloppy joe. Moist fish. Eggs prepared any way. Casseroles with small chunks of meats, ground meats, or tender meats. Dairy Cheese spreads without coarse large chunks. Shredded cheese. Cheese slices. Cottage cheese. Milk at the right texture. Smooth frappes. Yogurt  without nuts or coconut. Ask your health care provider whether you can have frozen desserts (such as malts or milk shakes) and thin liquids. Sweets/Desserts Soft, smooth, moist desserts. Non-chewy, smooth candy. Jam. Jelly. Honey. Preserves. Ask your health care provider whether you can have frozen desserts. Fats and Oils Butter. Oils. Margarine. Mayonnaise. Gravy. Spreads. Other All seasonings and sweeteners. All sauces without large chunks. The items listed above may not be a complete list of recommended foods or beverages. Contact your dietitian for more options. What foods are not recommended? Grains Coarse or dry cereals. Dry breads. Toast. Crackers. Tough, crusty breads, such French bread and  baguettes. Tough, crisp fried potatoes. Potato skins. Dry bread stuffing. Granola. Popcorn. Chips. Vegetables All raw vegetables except shredded lettuce. Cooked corn. Rubbery or stiff cooked vegetables. Stringy vegetables, such as celery. Fruits Hard fruits that are difficult to chew, such as apples or pears. Stringy, high-pulp fruits, such as pineapple, papaya, or mango. Fruits with tough skins, such as grapes. Coconut. All dried fruits. Fruit leather. Fruit roll-ups. Fruit snacks. Meat and Other Protein Sources Dry or tough meats or poultry. Dry fish. Fish with bones. Peanut butter. All nuts and seeds. Dairy Any with nuts, seeds, chocolate chips, dried fruit, coconut, or pineapple. Sweets/Desserts Dry cakes. Chewy or dry cookies. Any with nuts, seeds, dry fruits, coconut, pineapple, or anything dry, sticky, or hard. Chewy caramel. Licorice. Taffy-type candies. Ask your health care provider whether you can have frozen desserts. Fats and Oils Any with chunks, nuts, seeds, or pineapple. Olives. Rosita Fire. Other Soups with tough or large chunks of meats, poultry, or vegetables. Corn or clam chowder. The items listed above may not be a complete list of foods and beverages to avoid. Contact your dietitian for more information. This information is not intended to replace advice given to you by your health care provider. Make sure you discuss any questions you have with your health care provider. Document Released: 01/07/2005 Document Revised: 06/15/2015 Document Reviewed: 12/21/2012 Elsevier Interactive Patient Education  [employer].

## 2017-05-07 NOTE — Assessment & Plan Note (Signed)
GERD symptoms are currently well managed symptomatically.  Recommend she continue Protonix.  Follow-up in 6 months.  Call if any worsening symptoms before then.

## 2017-05-07 NOTE — Assessment & Plan Note (Signed)
Some "dysphasia" symptoms.  Barium pill esophagram found GERD, significant esophageal dysmotility, large hiatal hernia.  We will try to manage her symptomatically given her age and comorbidities.  Continue GERD medication and, of note, her GERD symptoms are currently well managed.  We will provide further information related to dysphasia diet.  I have offered speech-language pathology referral and they would like to hold off for now but may take [place] up on this in the future pending how she does with her dysphagia diet.  Encouraged her to drink adequate fluids to prevent thick phlegm as well as to help promote food passage. Follow-up in 6 months.  Call if any worsening symptoms.

## 2017-05-07 NOTE — Progress Notes (Signed)
Referring Provider: Avon Gully, MD Primary Care Physician:  Avon Gully, MD Primary GI:  Dr. Jena Gauss  Chief Complaint  Patient presents with  . Dysphagia    wants to discuss swallowing test results  . GI Bleeding    HPI:   [name] is a 84 y.o. female who presents for follow-up on GI bleed and dysphasia.  Patient was last seen in our office 02/27/2017 for the same.  Hospital admission in 2017 for severe anemia and melena with a hemoglobin of 5.3 status post transfusion.  EGD did not show any acute bleeding.  Anticoagulation was stopped and she was discharged on Protonix twice a day.  Readmission later in the year with aspiration pneumonia she was discharged on hospice.  She did not follow-up as recommended in our office.  At her last visit she was doing okay overall, noted "something like phlegm arising in my throat which caused me to cough like a cold but I do not have a cold."  Denies solid food dysphagia or pill dysphagia.  However, her daughter states she "gets strangled easily" but usually along with thick mucus.  Dark stools on iron but not "tarry."  No other GI symptoms.  Recommended barium pill esophagram, continue medications, follow-up in 2 months.  Barium pill esophagram completed 03/05/2017 which found severe diffuse esophageal dysmotility, no evidence of mass or stricture, large hiatal hernia with majority of the stomach in the inferior thorax and associated organoaxial gastric volvulus, prolonged retention of contrast in the proximal stomach without evidence of gastric outlet obstruction, gastroesophageal reflux.  Last CBC completed 11/22/2016 which found hemoglobin of 11.1.  Today she states she is doing better overall. She is accompanied by her daughter. We discuss the BPE results and her non-surgical candidacy. Still easy to get "strangled" if she's not careful. Denies abdominal pain, N/V, hematochezia, melena. Still with thick "phlegm" that she can get choked on. Denies  unintentional weight loss. Objectively she gained 3 lbs in the past 2 months. Denies chest pain, dyspnea, dizziness, lightheadedness, syncope, near syncope. Denies any other upper or lower GI symptoms.  Past Medical History:  Diagnosis Date  . Anemia   . Anxiety   . Atrial fibrillation (HCC)   . DJD (degenerative joint disease)   . Hypertension   . Osteoporosis   . Seasonal allergies   . Sleep apnea     Past Surgical History:  Procedure Laterality Date  . CHOLECYSTECTOMY    . ESOPHAGOGASTRODUODENOSCOPY N/A 05/16/2015   Procedure: ESOPHAGOGASTRODUODENOSCOPY (EGD);  Surgeon: West Bali, MD;  Location: [facility];  Service: Endoscopy;  Laterality: N/A;  . HEMORRHOID SURGERY    . PACEMAKER INSERTION    . PPM GENERATOR CHANGEOUT N/A 08/02/2016   Procedure: PPM Generator Changeout;  Surgeon: Marinus Maw, MD;  Location: [facility] INVASIVE CV LAB;  Service: Cardiovascular;  Laterality: N/A;  . TOTAL HIP ARTHROPLASTY      Current Outpatient Medications  Medication Sig Dispense Refill  . acetaminophen (TYLENOL) 500 MG tablet Take 500 mg by mouth at bedtime.    Marland Kitchen amiodarone (PACERONE) 200 MG tablet Take 1 Tablet Daily Monday- Friday. None on Saturday or Sunday 90 tablet 3  . amLODipine (NORVASC) 5 MG tablet Take 1 tablet (5 mg total) by mouth daily. 90 tablet 3  . aspirin EC 81 MG tablet Take 1 tablet (81 mg total) by mouth daily. 90 tablet 3  . ferrous sulfate 325 (65 FE) MG EC tablet Take 325 mg by  mouth 2 (two) times daily.     . fluticasone (FLONASE) 50 MCG/ACT nasal spray Place 2 sprays into both nostrils daily.    . furosemide (LASIX) 40 MG tablet Take 1 tablet (40 mg total) by mouth 2 (two) times daily. (Patient taking differently: MON,WED Friday TWICE DAILY) 60 tablet 11  . lisinopril (PRINIVIL,ZESTRIL) 40 MG tablet Take 1 tablet (40 mg total) by mouth daily. 90 tablet 0  . loratadine (CLARITIN) 10 MG tablet Take 1 tablet by mouth daily  0  . pantoprazole (PROTONIX) 40 MG tablet  Take 40 mg by mouth daily.    . potassium chloride SA (K-DUR,KLOR-CON) 20 MEQ tablet TAKE 1 TABLET BY MOUTH DAILY (Patient taking differently: TAKE 1 TABLET BY MOUTH DAILY, but on MON/WED/FRIDAY takes 2 daily) 90 tablet 2   No current facility-administered medications for this visit.     Allergies as of 05/07/2017  . (No Known Allergies)    Family History  Problem Relation Age of Onset  . Colon cancer Father   . Colon cancer Paternal Aunt   . Crohn's disease Son     Social History   Socioeconomic History  . Marital status: Widowed    Spouse name: Not on file  . Number of children: Not on file  . Years of education: Not on file  . Highest education level: Not on file  Occupational History  . Not on file  Social Needs  . Financial resource strain: Not on file  . Food insecurity:    Worry: Not on file    Inability: Not on file  . Transportation needs:    Medical: Not on file    Non-medical: Not on file  Tobacco Use  . Smoking status: Never Smoker  . Smokeless tobacco: Never Used  Substance and Sexual Activity  . Alcohol use: No    Alcohol/week: 0.0 oz  . Drug use: No  . Sexual activity: Not on file  Lifestyle  . Physical activity:    Days per week: Not on file    Minutes per session: Not on file  . Stress: Not on file  Relationships  . Social connections:    Talks on phone: Not on file    Gets together: Not on file    Attends religious service: Not on file    Active member of club or organization: Not on file    Attends meetings of clubs or organizations: Not on file    Relationship status: Not on file  Other Topics Concern  . Not on file  Social History Narrative   Widowed   Daughter    Review of Systems: General: Negative for anorexia, weight loss, fever, chills, fatigue, weakness. ENT: Negative for hoarseness, difficulty swallowing. CV: Negative for chest pain, angina, palpitations, peripheral edema.  Respiratory: Negative for dyspnea at rest, cough,  sputum, wheezing.  GI: See history of present illness. Endo: Negative for unusual weight change.  Heme: Negative for bruising or bleeding.   Physical Exam: BP (!) 154/82   Pulse 76   Temp (!) 97 F (36.1 C) (Oral)   Ht 5\' 2"  (1.575 m)   Wt 143 lb 3.2 oz (65 kg)   BMI 26.19 kg/m  General:   Alert and oriented. Pleasant and cooperative. Well-nourished and well-developed.  Eyes:  Without icterus, sclera clear and conjunctiva pink.  Ears:  Normal auditory acuity. Cardiovascular:  S1, S2 present without murmurs appreciated. Extremities without clubbing or edema. Respiratory:  Clear to auscultation bilaterally. No wheezes, rales,  or rhonchi. No distress.  Gastrointestinal:  +BS, soft, non-tender and non-distended. No HSM noted. No guarding or rebound. No masses appreciated.  Rectal:  Deferred  Musculoskalatal:  Symmetrical without gross deformities. Skin:  Intact without significant lesions or rashes. Neurologic:  Alert and oriented x4;  grossly normal neurologically. Psych:  Alert and cooperative. Normal mood and affect. Heme/Lymph/Immune: No significant cervical adenopathy.    05/07/2017 2:37 PM   Disclaimer: This note was dictated with voice recognition software. Similar sounding words can inadvertently be transcribed and may not be corrected upon review.

## 2017-05-08 NOTE — Progress Notes (Signed)
cc'd to pcp 

## 2017-05-26 ENCOUNTER — Ambulatory Visit (INDEPENDENT_AMBULATORY_CARE_PROVIDER_SITE_OTHER): Payer: Medicare Other | Admitting: *Deleted

## 2017-05-26 DIAGNOSIS — I442 Atrioventricular block, complete: Secondary | ICD-10-CM

## 2017-05-26 NOTE — Progress Notes (Signed)
Remote pacemaker transmission.   

## 2017-05-28 ENCOUNTER — Encounter: Payer: Self-pay | Admitting: Cardiology

## 2017-06-10 LAB — CUP PACEART REMOTE DEVICE CHECK
Battery Remaining Longevity: 130 mo
Brady Statistic AS VP Percent: 5 %
Implantable Lead Implant Date: 20060926
Implantable Lead Implant Date: 20060926
Implantable Lead Location: 753859
Implantable Pulse Generator Implant Date: 20180713
Lead Channel Pacing Threshold Amplitude: 0.625 V
Lead Channel Pacing Threshold Pulse Width: 0.4 ms
Lead Channel Setting Pacing Amplitude: 1.5 V
Lead Channel Setting Pacing Pulse Width: 0.4 ms
Lead Channel Setting Sensing Sensitivity: 4 mV
MDC IDC LEAD LOCATION: 753860
MDC IDC MSMT BATTERY IMPEDANCE: 100 Ohm
MDC IDC MSMT BATTERY VOLTAGE: 2.78 V
MDC IDC MSMT LEADCHNL RA IMPEDANCE VALUE: 469 Ohm
MDC IDC MSMT LEADCHNL RA PACING THRESHOLD AMPLITUDE: 0.625 V
MDC IDC MSMT LEADCHNL RA PACING THRESHOLD PULSEWIDTH: 0.4 ms
MDC IDC MSMT LEADCHNL RV IMPEDANCE VALUE: 449 Ohm
MDC IDC SESS DTM: 20190506151252
MDC IDC SET LEADCHNL RV PACING AMPLITUDE: 2.5 V
MDC IDC STAT BRADY AP VP PERCENT: 94 %
MDC IDC STAT BRADY AP VS PERCENT: 1 %
MDC IDC STAT BRADY AS VS PERCENT: 0 %

## 2017-07-03 ENCOUNTER — Other Ambulatory Visit (HOSPITAL_COMMUNITY)
Admission: RE | Admit: 2017-07-03 | Discharge: 2017-07-03 | Disposition: A | Discharge status: Home / Self Care | Payer: Medicare Other | Source: Ambulatory Visit | Attending: Cardiovascular Disease | Admitting: Cardiovascular Disease

## 2017-07-03 ENCOUNTER — Ambulatory Visit (INDEPENDENT_AMBULATORY_CARE_PROVIDER_SITE_OTHER): Payer: Medicare Other | Admitting: Cardiovascular Disease

## 2017-07-03 ENCOUNTER — Ambulatory Visit (HOSPITAL_COMMUNITY)
Admission: RE | Admit: 2017-07-03 | Discharge: 2017-07-03 | Disposition: A | Discharge status: Home / Self Care | Payer: Medicare Other | Source: Ambulatory Visit | Attending: Cardiovascular Disease | Admitting: Cardiovascular Disease

## 2017-07-03 ENCOUNTER — Telehealth: Payer: Self-pay | Admitting: Cardiovascular Disease

## 2017-07-03 ENCOUNTER — Telehealth: Payer: Self-pay | Admitting: *Deleted

## 2017-07-03 ENCOUNTER — Encounter: Payer: Self-pay | Admitting: Cardiovascular Disease

## 2017-07-03 VITALS — BP 150/60 | HR 70 | Ht 63.0 in | Wt 138.0 lb

## 2017-07-03 DIAGNOSIS — I350 Nonrheumatic aortic (valve) stenosis: Secondary | ICD-10-CM

## 2017-07-03 DIAGNOSIS — M810 Age-related osteoporosis without current pathological fracture: Secondary | ICD-10-CM | POA: Diagnosis not present

## 2017-07-03 DIAGNOSIS — E785 Hyperlipidemia, unspecified: Secondary | ICD-10-CM | POA: Insufficient documentation

## 2017-07-03 DIAGNOSIS — I110 Hypertensive heart disease with heart failure: Secondary | ICD-10-CM | POA: Insufficient documentation

## 2017-07-03 DIAGNOSIS — R05 Cough: Secondary | ICD-10-CM | POA: Diagnosis not present

## 2017-07-03 DIAGNOSIS — I361 Nonrheumatic tricuspid (valve) insufficiency: Secondary | ICD-10-CM | POA: Insufficient documentation

## 2017-07-03 DIAGNOSIS — Z950 Presence of cardiac pacemaker: Secondary | ICD-10-CM

## 2017-07-03 DIAGNOSIS — Z7951 Long term (current) use of inhaled steroids: Secondary | ICD-10-CM | POA: Insufficient documentation

## 2017-07-03 DIAGNOSIS — R531 Weakness: Secondary | ICD-10-CM | POA: Diagnosis not present

## 2017-07-03 DIAGNOSIS — I352 Nonrheumatic aortic (valve) stenosis with insufficiency: Secondary | ICD-10-CM | POA: Diagnosis not present

## 2017-07-03 DIAGNOSIS — I517 Cardiomegaly: Secondary | ICD-10-CM | POA: Diagnosis not present

## 2017-07-03 DIAGNOSIS — I25118 Atherosclerotic heart disease of native coronary artery with other forms of angina pectoris: Secondary | ICD-10-CM | POA: Diagnosis not present

## 2017-07-03 DIAGNOSIS — I2510 Atherosclerotic heart disease of native coronary artery without angina pectoris: Secondary | ICD-10-CM | POA: Insufficient documentation

## 2017-07-03 DIAGNOSIS — R0602 Shortness of breath: Secondary | ICD-10-CM | POA: Diagnosis not present

## 2017-07-03 DIAGNOSIS — R059 Cough, unspecified: Secondary | ICD-10-CM

## 2017-07-03 DIAGNOSIS — I509 Heart failure, unspecified: Secondary | ICD-10-CM | POA: Insufficient documentation

## 2017-07-03 DIAGNOSIS — K449 Diaphragmatic hernia without obstruction or gangrene: Secondary | ICD-10-CM | POA: Diagnosis not present

## 2017-07-03 DIAGNOSIS — I5032 Chronic diastolic (congestive) heart failure: Secondary | ICD-10-CM

## 2017-07-03 DIAGNOSIS — I4891 Unspecified atrial fibrillation: Secondary | ICD-10-CM | POA: Insufficient documentation

## 2017-07-03 DIAGNOSIS — R5383 Other fatigue: Secondary | ICD-10-CM

## 2017-07-03 DIAGNOSIS — I10 Essential (primary) hypertension: Secondary | ICD-10-CM

## 2017-07-03 DIAGNOSIS — I480 Paroxysmal atrial fibrillation: Secondary | ICD-10-CM | POA: Insufficient documentation

## 2017-07-03 DIAGNOSIS — Z79899 Other long term (current) drug therapy: Secondary | ICD-10-CM | POA: Diagnosis not present

## 2017-07-03 DIAGNOSIS — R0609 Other forms of dyspnea: Secondary | ICD-10-CM | POA: Diagnosis not present

## 2017-07-03 LAB — CBC WITH DIFFERENTIAL/PLATELET
BASOS PCT: 0 %
Basophils Absolute: 0 10*3/uL (ref 0.0–0.1)
EOS ABS: 0.1 10*3/uL (ref 0.0–0.7)
Eosinophils Relative: 2 %
HEMATOCRIT: 39.4 % (ref 36.0–46.0)
HEMOGLOBIN: 12.8 g/dL (ref 12.0–15.0)
LYMPHS ABS: 0.8 10*3/uL (ref 0.7–4.0)
Lymphocytes Relative: 14 %
MCH: 32.2 pg (ref 26.0–34.0)
MCHC: 32.5 g/dL (ref 30.0–36.0)
MCV: 99 fL (ref 78.0–100.0)
Monocytes Absolute: 0.8 10*3/uL (ref 0.1–1.0)
Monocytes Relative: 14 %
NEUTROS ABS: 3.7 10*3/uL (ref 1.7–7.7)
NEUTROS PCT: 70 %
Platelets: 147 10*3/uL — ABNORMAL LOW (ref 150–400)
RBC: 3.98 MIL/uL (ref 3.87–5.11)
RDW: 13.5 % (ref 11.5–15.5)
WBC: 5.4 10*3/uL (ref 4.0–10.5)

## 2017-07-03 LAB — BRAIN NATRIURETIC PEPTIDE: B NATRIURETIC PEPTIDE 5: 280 pg/mL — AB (ref 0.0–100.0)

## 2017-07-03 LAB — ECHOCARDIOGRAM COMPLETE
Height: 63 in
WEIGHTICAEL: 2208 [oz_av]

## 2017-07-03 MED ORDER — BENZONATATE 100 MG PO CAPS: 100.0000 mg | ORAL_CAPSULE | Freq: Three times a day (TID) | ORAL | 0 refills | Status: DC | PRN

## 2017-07-03 NOTE — Telephone Encounter (Signed)
Spoke with daughter who states sputum is thick and white in color. Daughter requesting appt today. Please advise.

## 2017-07-03 NOTE — Telephone Encounter (Signed)
Please ask her to come in this morning and I can see her.

## 2017-07-03 NOTE — Telephone Encounter (Signed)
Daughter notified 

## 2017-07-03 NOTE — Patient Instructions (Signed)
Medication Instructions:  Your physician recommends that you continue on your current medications as directed. Please refer to the Current Medication list given to you today.   Labwork: Your physician recommends that you return for lab work today.   Testing/Procedures: A chest x-ray takes a picture of the organs and structures inside the chest, including the heart, lungs, and blood vessels. This test can show several things, including, whether the heart is enlarges; whether fluid is building up in the lungs; and whether pacemaker / defibrillator leads are still in place.  Follow-Up: Your physician recommends that you schedule a follow-up appointment with Dr. Purvis Sheffield   Any Other Special Instructions Will Be Listed Below (If Applicable).     If you need a refill on your cardiac medications before your next appointment, please call your pharmacy.  Thank you for choosing [facility]! ]

## 2017-07-03 NOTE — Progress Notes (Signed)
*  PRELIMINARY RESULTS* Echocardiogram 2D Echocardiogram has been performed.  [name] 07/03/2017, 2:15 PM

## 2017-07-03 NOTE — Telephone Encounter (Signed)
Pt's daughter called requesting the pt be seen today due to cough. She's had a cough for about 3 weeks now and for the past 3 days it's gotten worse and she has a lot of coming up out of her chest and it's very persistent. She's afraid she's going to get choked

## 2017-07-03 NOTE — Progress Notes (Signed)
SUBJECTIVE: The patient presents today for the evaluation of a productive cough.  She has a history of chronic diastolic heart failure, aortic stenosis, coronary artery disease, pacemaker, and paroxysmal atrial fibrillation.  She is not an anticoagulationcandidate.  Echocardiogram on 03/10/15 demonstrated vigorous left ventricular systolic function, EF 65-70%, grade 2 diastolic dysfunction with elevated filling pressures, severe asymmetric septal hypertrophy with some subvalvular disease with no significant gradient at rest, mild-to-moderate aortic stenosis with mild aortic regurgitation, mild mitral regurgitation , moderately reduced right ventricular systolic function, and severe tricuspid regurgitation with moderately increased pulmonary pressures, 53 mmHg.  She is here with her daughter, [name].  She has been having a spasmodic cough over the past 2 weeks but it has gotten worse over the last several days. It is productive of thick, white phlegm. She denies fevers and chills. She has been more fatigued this year, which she did not mention when I saw her in January. She denies chest pain and leg swelling. She takes Lasix 40 mg bid on Mon, Wed, and Fri and 40 mg daily on all other days of the week. She has tried Delsum without any significant relief. She takes Protonix.  Her cough is worse at night. Her daughter has noticed that the patient is more short of breath with exertion. She has also noticed that her mother gags when she coughs. She is worried about the development of aspiration pneumonia.  She has seasonal allergies and takes Claritin and Flonase.   Soc Hx:She has 7 children who live in [place], [place], and [place]. She has several grandchildren and great grandchildren.   Review of Systems: As per "subjective", otherwise negative.  No Known Allergies  Current Outpatient Medications  Medication Sig Dispense Refill  . acetaminophen (TYLENOL) 500 MG tablet Take  500 mg by mouth at bedtime.    Marland Kitchen amiodarone (PACERONE) 200 MG tablet Take 1 Tablet Daily Monday- Friday. None on Saturday or Sunday 90 tablet 3  . amLODipine (NORVASC) 5 MG tablet Take 1 tablet (5 mg total) by mouth daily. 90 tablet 3  . aspirin EC 81 MG tablet Take 1 tablet (81 mg total) by mouth daily. 90 tablet 3  . ferrous sulfate 325 (65 FE) MG EC tablet Take 325 mg by mouth 2 (two) times daily.     . fluticasone (FLONASE) 50 MCG/ACT nasal spray Place 2 sprays into both nostrils daily.    . furosemide (LASIX) 40 MG tablet Take 1 tablet (40 mg total) by mouth 2 (two) times daily. (Patient taking differently: MON,WED Friday TWICE DAILY) 60 tablet 11  . lisinopril (PRINIVIL,ZESTRIL) 40 MG tablet Take 1 tablet (40 mg total) by mouth daily. 90 tablet 0  . loratadine (CLARITIN) 10 MG tablet Take 1 tablet by mouth daily  0  . pantoprazole (PROTONIX) 40 MG tablet Take 40 mg by mouth daily.    . potassium chloride SA (K-DUR,KLOR-CON) 20 MEQ tablet TAKE 1 TABLET BY MOUTH DAILY (Patient taking differently: TAKE 1 TABLET BY MOUTH DAILY, but on MON/WED/FRIDAY takes 2 daily) 90 tablet 2   No current facility-administered medications for this visit.     Past Medical History:  Diagnosis Date  . Anemia   . Anxiety   . Atrial fibrillation (HCC)   . DJD (degenerative joint disease)   . Hypertension   . Osteoporosis   . Seasonal allergies   . Sleep apnea     Past Surgical History:  Procedure Laterality Date  . CHOLECYSTECTOMY    .  ESOPHAGOGASTRODUODENOSCOPY N/A 05/16/2015   Procedure: ESOPHAGOGASTRODUODENOSCOPY (EGD);  Surgeon: West Bali, MD;  [facility];  Service: Endoscopy;  Laterality: N/A;  . HEMORRHOID SURGERY    . PACEMAKER INSERTION    . PPM GENERATOR CHANGEOUT N/A 08/02/2016   Procedure: PPM Generator Changeout;  Surgeon: Marinus Maw, MD;  Location: [facility] INVASIVE CV LAB;  Service: Cardiovascular;  Laterality: N/A;  . TOTAL HIP ARTHROPLASTY      Social History    Socioeconomic History  . Marital status: Widowed    Spouse name: Not on file  . Number of children: Not on file  . Years of education: Not on file  . Highest education level: Not on file  Occupational History  . Not on file  Social Needs  . Financial resource strain: Not on file  . Food insecurity:    Worry: Not on file    Inability: Not on file  . Transportation needs:    Medical: Not on file    Non-medical: Not on file  Tobacco Use  . Smoking status: Never Smoker  . Smokeless tobacco: Never Used  Substance and Sexual Activity  . Alcohol use: No    Alcohol/week: 0.0 oz  . Drug use: No  . Sexual activity: Not on file  Lifestyle  . Physical activity:    Days per week: Not on file    Minutes per session: Not on file  . Stress: Not on file  Relationships  . Social connections:    Talks on phone: Not on file    Gets together: Not on file    Attends religious service: Not on file    Active member of club or organization: Not on file    Attends meetings of clubs or organizations: Not on file    Relationship status: Not on file  . Intimate partner violence:    Fear of current or ex partner: Not on file    Emotionally abused: Not on file    Physically abused: Not on file    Forced sexual activity: Not on file  Other Topics Concern  . Not on file  Social History Narrative   Widowed   Daughter     Vitals:   07/03/17 1107  BP: (!) 150/60  Pulse: 70  SpO2: 95%  Weight: 138 lb (62.6 kg)  Height: 5\' 3"  (1.6 m)    Wt Readings from Last 3 Encounters:  07/03/17 138 lb (62.6 kg)  05/07/17 143 lb 3.2 oz (65 kg)  03/10/17 142 lb (64.4 kg)     PHYSICAL EXAM General: NAD HEENT: Normal. Neck: No JVD, no thyromegaly. Lungs: Faint mild expiratory wheezes at bases, faint crackles at bases. CV: Regular rate and rhythm, normal S1/S2, no S3/S4, 2/6 systolic murmur loudest over RUSB, but heard along left sternal border as well (unchanged). No pretibial or periankle edema.    Abdomen: Soft, nontender, no distention.  Neurologic: Alert and oriented.  Psych: Normal affect. Skin: Normal. Musculoskeletal: No gross deformities.    ECG: Most recent ECG reviewed.   Labs: Lab Results  Component Value Date/Time   K 3.5 12/11/2016 10:38 AM   BUN 20 12/11/2016 10:38 AM   BUN 18 07/30/2016 12:39 PM   CREATININE 1.14 (H) 12/11/2016 10:38 AM   ALT 36 01/27/2016 09:13 PM   TSH 2.676 11/22/2016 10:40 AM   HGB 11.1 (L) 11/22/2016 10:40 AM   HGB 13.1 07/30/2016 12:39 PM     Lipids: No results found for: LDLCALC, LDLDIRECT, CHOL,  TRIG, HDL     ASSESSMENT AND PLAN: 1. Cough, shortness of breath, and fatigue: While she may have an upper respiratory tract infection which is led to a persistent cough, I am also concerned about CHF given her aortic stenosis.  I will obtain an echocardiogram today to evaluate cardiac structure and function and to assess for progression of aortic stenosis severity.  I will obtain a CBC with differential and a BNP.  I will also obtain a chest x-ray.  If there is no evidence of decompensated heart failure, I may prescribe Tessalon Perles 100 mg 3 times daily but this will be dependent upon objective findings.  2. CAD: Symptomatically stable with respect to this.  Currently on amiodaroneand aspirin. Not on statin. Low risk nuclear stress test (04/12/15). No changes to therapy.   3. Symptomatic bradycardia s/p pacemaker: She takes amiodarone for paroxysmal atrial fibrillation and bigeminal PVCs. Normal device function when last checked.Follows with EP.  4. Essential HTN:  Elevated today.  I will monitor for now without changes to medical therapy.  5. Chronic diastolic heart failure: Please see discussion #1. She takes Lasix 40 mg bid on Mon, Wed, and Fri and 40 mg daily on all other days of the week. She also wears compression stockings.  I previously told her daughter to weigh her mother daily.   5. Aortic stenosis: Mild to moderate  when last assessed.  I will obtain an echocardiogram today to evaluate for interval progression of aortic stenosis severity.  6. Paroxysmal atrial fibrillation:Stable on amiodarone. Not an anticoagulation candidate.     Disposition: Follow up in 1 month with me.  I encouraged the patient's daughter to contact her mother's PCP (Dr. Felecia Shelling) and to procure an appointment in the very near future.  A high level of decision making was required for increased medical complexities.   Prentice Docker, M.D., F.A.C.C.

## 2017-07-03 NOTE — Telephone Encounter (Signed)
-----   Message from Laqueta Linden, MD sent at 07/03/2017  3:43 PM EDT ----- No CHF or infection. Treat cough with Tessalon Perles 100 mg tid as needed. Send copy of all results to PCP. Have her see him in near future.

## 2017-07-07 DIAGNOSIS — M79675 Pain in left toe(s): Secondary | ICD-10-CM | POA: Diagnosis not present

## 2017-07-07 DIAGNOSIS — M79674 Pain in right toe(s): Secondary | ICD-10-CM | POA: Diagnosis not present

## 2017-07-07 DIAGNOSIS — I739 Peripheral vascular disease, unspecified: Secondary | ICD-10-CM | POA: Diagnosis not present

## 2017-07-07 DIAGNOSIS — B351 Tinea unguium: Secondary | ICD-10-CM | POA: Diagnosis not present

## 2017-07-15 DIAGNOSIS — I509 Heart failure, unspecified: Secondary | ICD-10-CM | POA: Diagnosis not present

## 2017-07-15 DIAGNOSIS — I2510 Atherosclerotic heart disease of native coronary artery without angina pectoris: Secondary | ICD-10-CM | POA: Diagnosis not present

## 2017-07-15 DIAGNOSIS — Z959 Presence of cardiac and vascular implant and graft, unspecified: Secondary | ICD-10-CM | POA: Diagnosis not present

## 2017-07-15 DIAGNOSIS — I10 Essential (primary) hypertension: Secondary | ICD-10-CM | POA: Diagnosis not present

## 2017-07-25 ENCOUNTER — Encounter: Payer: Self-pay | Admitting: Cardiovascular Disease

## 2017-07-25 ENCOUNTER — Ambulatory Visit (INDEPENDENT_AMBULATORY_CARE_PROVIDER_SITE_OTHER): Payer: Medicare Other | Admitting: Cardiovascular Disease

## 2017-07-25 VITALS — BP 142/80 | HR 88 | Ht 60.0 in | Wt 146.0 lb

## 2017-07-25 DIAGNOSIS — R05 Cough: Secondary | ICD-10-CM | POA: Diagnosis not present

## 2017-07-25 DIAGNOSIS — I38 Endocarditis, valve unspecified: Secondary | ICD-10-CM | POA: Diagnosis not present

## 2017-07-25 DIAGNOSIS — I480 Paroxysmal atrial fibrillation: Secondary | ICD-10-CM | POA: Diagnosis not present

## 2017-07-25 DIAGNOSIS — I350 Nonrheumatic aortic (valve) stenosis: Secondary | ICD-10-CM

## 2017-07-25 DIAGNOSIS — Z950 Presence of cardiac pacemaker: Secondary | ICD-10-CM

## 2017-07-25 DIAGNOSIS — R0602 Shortness of breath: Secondary | ICD-10-CM | POA: Diagnosis not present

## 2017-07-25 DIAGNOSIS — R059 Cough, unspecified: Secondary | ICD-10-CM

## 2017-07-25 DIAGNOSIS — I10 Essential (primary) hypertension: Secondary | ICD-10-CM | POA: Diagnosis not present

## 2017-07-25 DIAGNOSIS — R5383 Other fatigue: Secondary | ICD-10-CM | POA: Diagnosis not present

## 2017-07-25 DIAGNOSIS — I5032 Chronic diastolic (congestive) heart failure: Secondary | ICD-10-CM | POA: Diagnosis not present

## 2017-07-25 DIAGNOSIS — I25118 Atherosclerotic heart disease of native coronary artery with other forms of angina pectoris: Secondary | ICD-10-CM

## 2017-07-25 MED ORDER — POTASSIUM CHLORIDE CRYS ER 20 MEQ PO TBCR: 20.0000 meq | EXTENDED_RELEASE_TABLET | Freq: Two times a day (BID) | ORAL | 2 refills | Status: DC

## 2017-07-25 MED ORDER — FUROSEMIDE 40 MG PO TABS: 40.0000 mg | ORAL_TABLET | Freq: Two times a day (BID) | ORAL | 6 refills | Status: DC

## 2017-07-25 NOTE — Patient Instructions (Addendum)
Your physician wants you to follow-up in: 3 months  with Dr.Koneswaran     INCREASE Lasix 40 mg  and Potassium 20 meq to twice a day    Get IDC:VUDT in 1 week    No tests ordered today     Thank you for choosing [facility] !

## 2017-07-25 NOTE — Progress Notes (Signed)
SUBJECTIVE: The patient presents for routine follow-up.  I prescribed Tessalon Perles in mid June for a persistent cough.  I obtained a follow-up echocardiogram.  It was performed on 07/03/2017 and demonstrated normal left ventricular systolic function, LVEF 65 to 16%.  There was normal regional wall motion.  There was restrictive diastolic physiology with increased filling pressures.  There is moderate to severe aortic valve stenosis and mild to moderate aortic regurgitation.  There was moderate mitral regurgitation and severe tricuspid regurgitation.  There was severe pulmonary hypertension.  Chest x-ray showed marked cardiomegaly with a large hiatal hernia but no CHF.  BNP was mildly elevated at 280.  CBC was essentially unremarkable other than some mild thrombocytopenia, platelets 147.  She also has chronic diastolic heart failure, coronary artery disease, and paroxysmal atrial fibrillation.  She is not an anticoagulation candidate.  She is here with her daughter, [name].  She takes Lasix 40 mg bid on Mon, Wed, and Fri and 40 mg daily on all other days of the week.  I ordered and personally reviewed the ECG performed today which demonstrates a ventricular paced rhythm.  She denies leg swelling.  She has an intermittent cough which is nonproductive.  She denies chest pain.  She has fatigue.  She denies seasonal allergies and paroxysmal nocturnal dyspnea.      Soc Hx:She has 7 children who live in [place], [place], and [place]. She has several grandchildren and great grandchildren.   Review of Systems: As per "subjective", otherwise negative.  No Known Allergies  Current Outpatient Medications  Medication Sig Dispense Refill  . acetaminophen (TYLENOL) 500 MG tablet Take 500 mg by mouth at bedtime.    Marland Kitchen amiodarone (PACERONE) 200 MG tablet Take 1 Tablet Daily Monday- Friday. None on Saturday or Sunday 90 tablet 3  . amLODipine (NORVASC) 5 MG tablet Take 1 tablet (5  mg total) by mouth daily. 90 tablet 3  . aspirin EC 81 MG tablet Take 1 tablet (81 mg total) by mouth daily. 90 tablet 3  . ferrous sulfate 325 (65 FE) MG EC tablet Take 325 mg by mouth 2 (two) times daily.     . fluticasone (FLONASE) 50 MCG/ACT nasal spray Place 2 sprays into both nostrils daily.    . furosemide (LASIX) 40 MG tablet Take 40 mg by mouth as directed. alternates 40 mg with 80 mg daily    . lisinopril (PRINIVIL,ZESTRIL) 40 MG tablet Take 1 tablet (40 mg total) by mouth daily. 90 tablet 0  . loratadine (CLARITIN) 10 MG tablet Take 1 tablet by mouth daily  0  . pantoprazole (PROTONIX) 40 MG tablet Take 40 mg by mouth daily.    . potassium chloride SA (K-DUR,KLOR-CON) 20 MEQ tablet TAKE 1 TABLET BY MOUTH DAILY (Patient taking differently: TAKE 1 TABLET BY MOUTH DAILY, but on MON/WED/FRIDAY takes 2 daily) 90 tablet 2   No current facility-administered medications for this visit.     Past Medical History:  Diagnosis Date  . Anemia   . Anxiety   . Atrial fibrillation (HCC)   . DJD (degenerative joint disease)   . Hypertension   . Osteoporosis   . Seasonal allergies   . Sleep apnea     Past Surgical History:  Procedure Laterality Date  . CHOLECYSTECTOMY    . ESOPHAGOGASTRODUODENOSCOPY N/A 05/16/2015   Procedure: ESOPHAGOGASTRODUODENOSCOPY (EGD);  Surgeon: West Bali, MD;  Location: [facility];  Service: Endoscopy;  Laterality: N/A;  . HEMORRHOID SURGERY    .  PACEMAKER INSERTION    . PPM GENERATOR CHANGEOUT N/A 08/02/2016   Procedure: PPM Generator Changeout;  Surgeon: Marinus Maw, MD;  Location: [facility] INVASIVE CV LAB;  Service: Cardiovascular;  Laterality: N/A;  . TOTAL HIP ARTHROPLASTY      Social History   Socioeconomic History  . Marital status: Widowed    Spouse name: Not on file  . Number of children: Not on file  . Years of education: Not on file  . Highest education level: Not on file  Occupational History  . Not on file  Social Needs  . Financial  resource strain: Not on file  . Food insecurity:    Worry: Not on file    Inability: Not on file  . Transportation needs:    Medical: Not on file    Non-medical: Not on file  Tobacco Use  . Smoking status: Never Smoker  . Smokeless tobacco: Never Used  Substance and Sexual Activity  . Alcohol use: No    Alcohol/week: 0.0 oz  . Drug use: No  . Sexual activity: Not on file  Lifestyle  . Physical activity:    Days per week: Not on file    Minutes per session: Not on file  . Stress: Not on file  Relationships  . Social connections:    Talks on phone: Not on file    Gets together: Not on file    Attends religious service: Not on file    Active member of club or organization: Not on file    Attends meetings of clubs or organizations: Not on file    Relationship status: Not on file  . Intimate partner violence:    Fear of current or ex partner: Not on file    Emotionally abused: Not on file    Physically abused: Not on file    Forced sexual activity: Not on file  Other Topics Concern  . Not on file  Social History Narrative   Widowed   Daughter     Vitals:   07/25/17 1402  BP: (!) 142/80  Pulse: 88  SpO2: 97%  Weight: 146 lb (66.2 kg)  Height: 5' (1.524 m)    Wt Readings from Last 3 Encounters:  07/25/17 146 lb (66.2 kg)  07/03/17 138 lb (62.6 kg)  05/07/17 143 lb 3.2 oz (65 kg)     PHYSICAL EXAM General: NAD HEENT: Normal. Neck: No JVD, no thyromegaly. Lungs: Clear to auscultation bilaterally with normal respiratory effort. CV: Regular rate and rhythm, normal S1/S2, no S3/S4,2/6 systolic murmur loudest over RUSB, but heard along left sternal border as well (unchanged). No pretibial or periankle edema.  Abdomen: Soft, nontender, no distention.  Neurologic: Alert and oriented.  Psych: Normal affect. Skin: Normal. Musculoskeletal: No gross deformities.    ECG: Reviewed above under Subjective   Labs: Lab Results  Component Value Date/Time   K 3.5  12/11/2016 10:38 AM   BUN 20 12/11/2016 10:38 AM   BUN 18 07/30/2016 12:39 PM   CREATININE 1.14 (H) 12/11/2016 10:38 AM   ALT 36 01/27/2016 09:13 PM   TSH 2.676 11/22/2016 10:40 AM   HGB 12.8 07/03/2017 01:36 PM   HGB 13.1 07/30/2016 12:39 PM     Lipids: No results found for: LDLCALC, LDLDIRECT, CHOL, TRIG, HDL     ASSESSMENT AND PLAN: 1.  Exertional dyspnea and fatigue with significant valvular heart disease and restrictive diastolic dysfunction/chronic diastolic heart failure: This is multifactorial in etiology.  Most recent echocardiogram reviewed above with  restrictive diastolic physiology and elevated filling pressures, moderate mitral, mild to moderate aortic, and severe tricuspid regurgitation.  There was moderate to severe aortic stenosis.  Chest x-ray was without CHF.  BNP was mildly elevated at 280. She currently takes Lasix 40 mg twice daily on Monday, Wednesday, and Friday and 40 mg daily on all other days of the week.  She also wears compression stockings. I will increase Lasix to 40 mg twice daily and also increase supplemental potassium proportionately.  I will obtain a basic metabolic panel in 1 week.  2.  Coronary disease: Symptomatically stable.  Continue aspirin.  3.  Symptomatic bradycardia status post pacemaker: She takes amiodarone for paroxysmal atrial fibrillation.  Normal device function when last checked.  4.  Hypertension: Blood pressure is mildly elevated.  I will monitor given increased diuretic dosage as noted above.  5.  Paroxysmal atrial fibrillation: Stable on amiodarone.  Not an anticoagulation candidate.    Disposition: Follow up 3 months   Prentice Docker, M.D., F.A.C.C.

## 2017-08-01 ENCOUNTER — Other Ambulatory Visit (HOSPITAL_COMMUNITY)
Admission: RE | Admit: 2017-08-01 | Discharge: 2017-08-01 | Disposition: A | Discharge status: Home / Self Care | Payer: Medicare Other | Source: Ambulatory Visit | Attending: Cardiovascular Disease | Admitting: Cardiovascular Disease

## 2017-08-01 DIAGNOSIS — I38 Endocarditis, valve unspecified: Secondary | ICD-10-CM | POA: Diagnosis not present

## 2017-08-01 DIAGNOSIS — I5032 Chronic diastolic (congestive) heart failure: Secondary | ICD-10-CM | POA: Insufficient documentation

## 2017-08-01 LAB — BASIC METABOLIC PANEL
ANION GAP: 10 (ref 5–15)
BUN: 19 mg/dL (ref 8–23)
CALCIUM: 8.9 mg/dL (ref 8.9–10.3)
CO2: 26 mmol/L (ref 22–32)
CREATININE: 1.08 mg/dL — AB (ref 0.44–1.00)
Chloride: 104 mmol/L (ref 98–111)
GFR calc Af Amer: 49 mL/min — ABNORMAL LOW (ref >60)
GFR, EST NON AFRICAN AMERICAN: 42 mL/min — AB (ref >60)
GLUCOSE: 124 mg/dL — AB (ref 70–99)
Potassium: 3.9 mmol/L (ref 3.5–5.1)
Sodium: 140 mmol/L (ref 135–145)

## 2017-08-25 ENCOUNTER — Ambulatory Visit (INDEPENDENT_AMBULATORY_CARE_PROVIDER_SITE_OTHER): Payer: Medicare Other | Admitting: *Deleted

## 2017-08-25 DIAGNOSIS — I442 Atrioventricular block, complete: Secondary | ICD-10-CM

## 2017-08-25 NOTE — Progress Notes (Signed)
Remote pacemaker transmission.   

## 2017-09-02 LAB — CUP PACEART REMOTE DEVICE CHECK
Battery Impedance: 111 Ohm
Battery Remaining Longevity: 127 mo
Brady Statistic AP VP Percent: 87 %
Implantable Lead Implant Date: 20060926
Implantable Lead Implant Date: 20060926
Implantable Lead Location: 753860
Implantable Lead Model: 5076
Implantable Pulse Generator Implant Date: 20180713
Lead Channel Impedance Value: 456 Ohm
Lead Channel Pacing Threshold Amplitude: 0.75 V
Lead Channel Setting Pacing Amplitude: 1.5 V
Lead Channel Setting Pacing Amplitude: 2.5 V
Lead Channel Setting Pacing Pulse Width: 0.4 ms
Lead Channel Setting Sensing Sensitivity: 4 mV
MDC IDC LEAD LOCATION: 753859
MDC IDC MSMT BATTERY VOLTAGE: 2.78 V
MDC IDC MSMT LEADCHNL RA PACING THRESHOLD AMPLITUDE: 0.5 V
MDC IDC MSMT LEADCHNL RA PACING THRESHOLD PULSEWIDTH: 0.4 ms
MDC IDC MSMT LEADCHNL RV IMPEDANCE VALUE: 445 Ohm
MDC IDC MSMT LEADCHNL RV PACING THRESHOLD PULSEWIDTH: 0.4 ms
MDC IDC SESS DTM: 20190805143932
MDC IDC STAT BRADY AP VS PERCENT: 1 %
MDC IDC STAT BRADY AS VP PERCENT: 11 %
MDC IDC STAT BRADY AS VS PERCENT: 0 %

## 2017-09-10 ENCOUNTER — Ambulatory Visit (INDEPENDENT_AMBULATORY_CARE_PROVIDER_SITE_OTHER): Payer: Medicare Other | Admitting: Internal Medicine

## 2017-09-10 ENCOUNTER — Encounter: Payer: Self-pay | Admitting: Internal Medicine

## 2017-09-10 VITALS — BP 136/60 | HR 64 | Ht 63.0 in | Wt 148.4 lb

## 2017-09-10 DIAGNOSIS — I442 Atrioventricular block, complete: Secondary | ICD-10-CM | POA: Diagnosis not present

## 2017-09-10 DIAGNOSIS — I5032 Chronic diastolic (congestive) heart failure: Secondary | ICD-10-CM

## 2017-09-10 DIAGNOSIS — I480 Paroxysmal atrial fibrillation: Secondary | ICD-10-CM | POA: Diagnosis not present

## 2017-09-10 DIAGNOSIS — Z950 Presence of cardiac pacemaker: Secondary | ICD-10-CM | POA: Diagnosis not present

## 2017-09-10 DIAGNOSIS — I25118 Atherosclerotic heart disease of native coronary artery with other forms of angina pectoris: Secondary | ICD-10-CM

## 2017-09-10 NOTE — Patient Instructions (Signed)
Medication Instructions:  Your physician has recommended you make the following change in your medication:  1.  Stop taking amiodarone  Labwork: None ordered.  Testing/Procedures: None ordered.  Follow-Up: Your physician wants you to follow-up in: 6 months with Dr. [name] in [place].   You will receive a reminder letter in the mail two months in advance. If you don't receive a letter, please call our office to schedule the follow-up appointment.  Remote monitoring is used to monitor your Pacemaker from home. This monitoring reduces the number of office visits required to check your device to one time per year. It allows [place] to keep an eye on the functioning of your device to ensure it is working properly. You are scheduled for a device check from home on 11/24/2017. You may send your transmission at any time that day. If you have a wireless device, the transmission will be sent automatically. After your physician reviews your transmission, you will receive a postcard with your next transmission date.  Any Other Special Instructions Will Be Listed Below (If Applicable).  If you need a refill on your cardiac medications before your next appointment, please call your pharmacy.

## 2017-09-10 NOTE — Progress Notes (Signed)
HPI [name] returns today for ongoing evaluation and management of her permanent pacemaker in the setting of complete heart block, in the setting of chronic diastolic heart failure. In the interim she has been treated with some oral lasix and her sob has improved. She underwent pacemaker generator change out approximately 8 months ago. No syncope. Her chronic peripheral edema has improved. She denies anginal symptoms. She notes that she continues to become more sedentary. In addition she has an occaisional cough.  No Known Allergies   Current Outpatient Medications  Medication Sig Dispense Refill  . acetaminophen (TYLENOL) 500 MG tablet Take 500 mg by mouth at bedtime.    Marland Kitchen amLODipine (NORVASC) 5 MG tablet Take 1 tablet (5 mg total) by mouth daily. 90 tablet 3  . aspirin EC 81 MG tablet Take 1 tablet (81 mg total) by mouth daily. 90 tablet 3  . ferrous sulfate 325 (65 FE) MG EC tablet Take 325 mg by mouth daily.     . fluticasone (FLONASE) 50 MCG/ACT nasal spray Place 2 sprays into both nostrils daily.    . furosemide (LASIX) 40 MG tablet Take 1 tablet (40 mg total) by mouth 2 (two) times daily. 60 tablet 6  . lisinopril (PRINIVIL,ZESTRIL) 40 MG tablet Take 1 tablet (40 mg total) by mouth daily. 90 tablet 0  . loratadine (CLARITIN) 10 MG tablet Take 1 tablet by mouth daily  0  . pantoprazole (PROTONIX) 40 MG tablet Take 40 mg by mouth daily.    . potassium chloride SA (K-DUR,KLOR-CON) 20 MEQ tablet Take 1 tablet (20 mEq total) by mouth 2 (two) times daily. 90 tablet 2   No current facility-administered medications for this visit.      Past Medical History:  Diagnosis Date  . Anemia   . Anxiety   . Atrial fibrillation (HCC)   . DJD (degenerative joint disease)   . Hypertension   . Osteoporosis   . Seasonal allergies   . Sleep apnea     ROS:   All systems reviewed and negative except as noted in the HPI.   Past Surgical History:  Procedure Laterality Date  .  CHOLECYSTECTOMY    . ESOPHAGOGASTRODUODENOSCOPY N/A 05/16/2015   Procedure: ESOPHAGOGASTRODUODENOSCOPY (EGD);  Surgeon: West Bali, MD;  [facility]: [facility];  Service: Endoscopy;  Laterality: N/A;  . HEMORRHOID SURGERY    . PACEMAKER INSERTION    . PPM GENERATOR CHANGEOUT N/A 08/02/2016   Procedure: PPM Generator Changeout;  Surgeon: Marinus Maw, MD;  Location: [facility] INVASIVE CV LAB;  Service: Cardiovascular;  Laterality: N/A;  . TOTAL HIP ARTHROPLASTY       Family History  Problem Relation Age of Onset  . Colon cancer Father   . Colon cancer Paternal Aunt   . Crohn's disease Son      Social History   Socioeconomic History  . Marital status: Widowed    Spouse name: Not on file  . Number of children: Not on file  . Years of education: Not on file  . Highest education level: Not on file  Occupational History  . Not on file  Social Needs  . Financial resource strain: Not on file  . Food insecurity:    Worry: Not on file    Inability: Not on file  . Transportation needs:    Medical: Not on file    Non-medical: Not on file  Tobacco Use  . Smoking status: Never Smoker  . Smokeless tobacco:  Never Used  Substance and Sexual Activity  . Alcohol use: No    Alcohol/week: 0.0 standard drinks  . Drug use: No  . Sexual activity: Not on file  Lifestyle  . Physical activity:    Days per week: Not on file    Minutes per session: Not on file  . Stress: Not on file  Relationships  . Social connections:    Talks on phone: Not on file    Gets together: Not on file    Attends religious service: Not on file    Active member of club or organization: Not on file    Attends meetings of clubs or organizations: Not on file    Relationship status: Not on file  . Intimate partner violence:    Fear of current or ex partner: Not on file    Emotionally abused: Not on file    Physically abused: Not on file    Forced sexual activity: Not on file  Other Topics Concern  . Not on  file  Social History Narrative   Widowed   Daughter     BP 136/60   Pulse 64   Ht 5\' 3"  (1.6 m)   Wt 148 lb 6.4 oz (67.3 kg)   BMI 26.29 kg/m   Physical Exam:  Well appearing 84 yo woman, NAD HEENT: Unremarkable Neck:  6 cm JVD, no thyromegally Lymphatics:  No adenopathy Back:  No CVA tenderness Lungs:  Clear with no wheezes HEART:  Regular rate rhythm, no murmurs, no rubs, no clicks Abd:  soft, positive bowel sounds, no organomegally, no rebound, no guarding Ext:  2 plus pulses, no edema, no cyanosis, no clubbing Skin:  No rashes no nodules Neuro:  CN II through XII intact, motor grossly intact   DEVICE  Normal device function.  See PaceArt for details.   Assess/Plan: 1. 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 I have asked her to stop amiodarone.  2. HTN  - she will continue her current medical therapy.  3. Chronic diastolic heart failure - she gets sob with exertion but her biggest limit is inability to ambulate. She is unsteady on her feet.  4. PPM - her medtronic DDD PM is working normally.  [name].D.

## 2017-09-14 LAB — CUP PACEART INCLINIC DEVICE CHECK
Battery Impedance: 100 Ohm
Battery Remaining Longevity: 128 mo
Battery Voltage: 2.78 V
Brady Statistic AS VP Percent: 11 %
Date Time Interrogation Session: 20190821151057
Implantable Lead Location: 753859
Implantable Lead Location: 753860
Implantable Pulse Generator Implant Date: 20180713
Lead Channel Impedance Value: 432 Ohm
Lead Channel Pacing Threshold Amplitude: 0.5 V
Lead Channel Pacing Threshold Amplitude: 0.75 V
Lead Channel Pacing Threshold Pulse Width: 0.4 ms
Lead Channel Setting Pacing Amplitude: 2.5 V
Lead Channel Setting Pacing Pulse Width: 0.4 ms
Lead Channel Setting Sensing Sensitivity: 4 mV
MDC IDC LEAD IMPLANT DT: 20060926
MDC IDC LEAD IMPLANT DT: 20060926
MDC IDC MSMT LEADCHNL RA PACING THRESHOLD PULSEWIDTH: 0.4 ms
MDC IDC MSMT LEADCHNL RA SENSING INTR AMPL: 0.7 mV
MDC IDC MSMT LEADCHNL RV IMPEDANCE VALUE: 414 Ohm
MDC IDC SET LEADCHNL RA PACING AMPLITUDE: 1.5 V
MDC IDC STAT BRADY AP VP PERCENT: 88 %
MDC IDC STAT BRADY AP VS PERCENT: 1 %
MDC IDC STAT BRADY AS VS PERCENT: 0 %

## 2017-09-15 DIAGNOSIS — M79675 Pain in left toe(s): Secondary | ICD-10-CM | POA: Diagnosis not present

## 2017-09-15 DIAGNOSIS — I739 Peripheral vascular disease, unspecified: Secondary | ICD-10-CM | POA: Diagnosis not present

## 2017-09-15 DIAGNOSIS — B351 Tinea unguium: Secondary | ICD-10-CM | POA: Diagnosis not present

## 2017-09-15 DIAGNOSIS — M79674 Pain in right toe(s): Secondary | ICD-10-CM | POA: Diagnosis not present

## 2017-10-14 DIAGNOSIS — I10 Essential (primary) hypertension: Secondary | ICD-10-CM | POA: Diagnosis not present

## 2017-10-14 DIAGNOSIS — I480 Paroxysmal atrial fibrillation: Secondary | ICD-10-CM | POA: Diagnosis not present

## 2017-10-14 DIAGNOSIS — Z23 Encounter for immunization: Secondary | ICD-10-CM | POA: Diagnosis not present

## 2017-10-14 DIAGNOSIS — M160 Bilateral primary osteoarthritis of hip: Secondary | ICD-10-CM | POA: Diagnosis not present

## 2017-10-14 DIAGNOSIS — Z959 Presence of cardiac and vascular implant and graft, unspecified: Secondary | ICD-10-CM | POA: Diagnosis not present

## 2017-10-22 ENCOUNTER — Other Ambulatory Visit (HOSPITAL_COMMUNITY)
Admission: RE | Admit: 2017-10-22 | Discharge: 2017-10-22 | Disposition: A | Discharge status: Home / Self Care | Payer: Medicare Other | Source: Ambulatory Visit | Attending: Cardiovascular Disease | Admitting: Cardiovascular Disease

## 2017-10-22 ENCOUNTER — Ambulatory Visit (INDEPENDENT_AMBULATORY_CARE_PROVIDER_SITE_OTHER): Payer: Medicare Other | Admitting: Cardiovascular Disease

## 2017-10-22 ENCOUNTER — Encounter: Payer: Self-pay | Admitting: Cardiovascular Disease

## 2017-10-22 VITALS — BP 122/76 | HR 83 | Ht 63.0 in | Wt 151.0 lb

## 2017-10-22 DIAGNOSIS — I442 Atrioventricular block, complete: Secondary | ICD-10-CM | POA: Diagnosis not present

## 2017-10-22 DIAGNOSIS — Z950 Presence of cardiac pacemaker: Secondary | ICD-10-CM

## 2017-10-22 DIAGNOSIS — I5032 Chronic diastolic (congestive) heart failure: Secondary | ICD-10-CM | POA: Diagnosis not present

## 2017-10-22 DIAGNOSIS — I480 Paroxysmal atrial fibrillation: Secondary | ICD-10-CM | POA: Diagnosis not present

## 2017-10-22 DIAGNOSIS — I10 Essential (primary) hypertension: Secondary | ICD-10-CM | POA: Insufficient documentation

## 2017-10-22 DIAGNOSIS — I38 Endocarditis, valve unspecified: Secondary | ICD-10-CM | POA: Diagnosis not present

## 2017-10-22 DIAGNOSIS — I350 Nonrheumatic aortic (valve) stenosis: Secondary | ICD-10-CM | POA: Diagnosis not present

## 2017-10-22 DIAGNOSIS — I25118 Atherosclerotic heart disease of native coronary artery with other forms of angina pectoris: Secondary | ICD-10-CM

## 2017-10-22 LAB — BASIC METABOLIC PANEL
ANION GAP: 9 (ref 5–15)
BUN: 23 mg/dL (ref 8–23)
CALCIUM: 9.2 mg/dL (ref 8.9–10.3)
CO2: 29 mmol/L (ref 22–32)
CREATININE: 1.08 mg/dL — AB (ref 0.44–1.00)
Chloride: 102 mmol/L (ref 98–111)
GFR calc non Af Amer: 42 mL/min — ABNORMAL LOW (ref >60)
GFR, EST AFRICAN AMERICAN: 49 mL/min — AB (ref >60)
Glucose, Bld: 110 mg/dL — ABNORMAL HIGH (ref 70–99)
Potassium: 4.4 mmol/L (ref 3.5–5.1)
SODIUM: 140 mmol/L (ref 135–145)

## 2017-10-22 NOTE — Progress Notes (Signed)
SUBJECTIVE: The patient presents for routine follow-up.  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  Echocardiogram 07/03/2017 demonstrated normal left ventricular systolic function, LVEF 65 to 02%.  There was normal regional wall motion.  There was restrictive diastolic physiology with increased filling pressures.  There is moderate to severe aortic valve stenosis and mild to moderate aortic regurgitation.  There was moderate mitral regurgitation and severe tricuspid regurgitation. There was severe pulmonary hypertension.  She has a large hiatal hernia as well.  She also has chronic diastolic heart failure, coronary artery disease, and paroxysmal atrial fibrillation.  She is not an anticoagulation candidate.  She is here with her daughter, [name].  Her cough is subsiding.  She walks up and down the sidewalk but otherwise sits for most of the day.  Her appetite is good.  Chronic exertional dyspnea is stable.  She denies chest pain and palpitations.  She also denies orthopnea, leg swelling, and paroxysmal nocturnal dyspnea.  She is getting ready to take a family trip to the beach.    Review of Systems: As per "subjective", otherwise negative.  No Known Allergies  Current Outpatient Medications  Medication Sig Dispense Refill  . acetaminophen (TYLENOL) 500 MG tablet Take 500 mg by mouth at bedtime.    Marland Kitchen amLODipine (NORVASC) 5 MG tablet Take 1 tablet (5 mg total) by mouth daily. 90 tablet 3  . aspirin EC 81 MG tablet Take 1 tablet (81 mg total) by mouth daily. 90 tablet 3  . ferrous sulfate 325 (65 FE) MG EC tablet Take 325 mg by mouth daily.     . fluticasone (FLONASE) 50 MCG/ACT nasal spray Place 2 sprays into both nostrils daily.    . furosemide (LASIX) 40 MG tablet Take 1 tablet (40 mg total) by mouth 2 (two) times daily. 60 tablet 6  . lisinopril (PRINIVIL,ZESTRIL) 40 MG tablet Take 1 tablet (40 mg total) by  mouth daily. 90 tablet 0  . loratadine (CLARITIN) 10 MG tablet Take 1 tablet by mouth daily  0  . pantoprazole (PROTONIX) 40 MG tablet Take 40 mg by mouth daily.    . potassium chloride SA (K-DUR,KLOR-CON) 20 MEQ tablet Take 1 tablet (20 mEq total) by mouth 2 (two) times daily. 90 tablet 2   No current facility-administered medications for this visit.     Past Medical History:  Diagnosis Date  . Anemia   . Anxiety   . Atrial fibrillation (HCC)   . DJD (degenerative joint disease)   . Hypertension   . Osteoporosis   . Seasonal allergies   . Sleep apnea     Past Surgical History:  Procedure Laterality Date  . CHOLECYSTECTOMY    . ESOPHAGOGASTRODUODENOSCOPY N/A 05/16/2015   Procedure: ESOPHAGOGASTRODUODENOSCOPY (EGD);  Surgeon: West Bali, MD;  Location: [facility];  Service: Endoscopy;  Laterality: N/A;  . HEMORRHOID SURGERY    . PACEMAKER INSERTION    . PPM GENERATOR CHANGEOUT N/A 08/02/2016   Procedure: PPM Generator Changeout;  Surgeon: Marinus Maw, MD;  Location: [facility] INVASIVE CV LAB;  Service: Cardiovascular;  Laterality: N/A;  . TOTAL HIP ARTHROPLASTY      Social History   Socioeconomic History  . Marital status: Widowed    Spouse name: Not on file  . Number of children: Not on file  . Years of education: Not on file  . Highest education level: Not on file  Occupational History  .  Not on file  Social Needs  . Financial resource strain: Not on file  . Food insecurity:    Worry: Not on file    Inability: Not on file  . Transportation needs:    Medical: Not on file    Non-medical: Not on file  Tobacco Use  . Smoking status: Never Smoker  . Smokeless tobacco: Never Used  Substance and Sexual Activity  . Alcohol use: No    Alcohol/week: 0.0 standard drinks  . Drug use: No  . Sexual activity: Not on file  Lifestyle  . Physical activity:    Days per week: Not on file    Minutes per session: Not on file  . Stress: Not on file  Relationships  . Social  connections:    Talks on phone: Not on file    Gets together: Not on file    Attends religious service: Not on file    Active member of club or organization: Not on file    Attends meetings of clubs or organizations: Not on file    Relationship status: Not on file  . Intimate partner violence:    Fear of current or ex partner: Not on file    Emotionally abused: Not on file    Physically abused: Not on file    Forced sexual activity: Not on file  Other Topics Concern  . Not on file  Social History Narrative   Widowed   Daughter     Vitals:   10/22/17 1432  BP: 122/76  Pulse: 83  SpO2: 94%  Weight: 151 lb (68.5 kg)  Height:  (1.6 m)    Wt Readings from Last 3 Encounters:  10/22/17 151 lb (68.5 kg)  09/10/17 148 lb 6.4 oz (67.3 kg)  07/25/17 146 lb (66.2 kg)     PHYSICAL EXAM General: NAD HEENT: Normal. Neck: No JVD, no thyromegaly. Lungs: Clear to auscultation bilaterally with normal respiratory effort. CV: Regular rate and rhythm, normal S1/S2, no S3/S4, 3/6 systolic murmur loudest over RUSB, but heard along left sternal border as well (unchanged). No pretibial or periankle edema.    Abdomen: Soft, nontender, no distention.  Neurologic: Alert and oriented.  Psych: Normal affect. Skin: Normal. Musculoskeletal: No gross deformities.    ECG: Reviewed above under Subjective   Labs: Lab Results  Component Value Date/Time   K 3.9 08/01/2017 11:37 AM   BUN 19 08/01/2017 11:37 AM   BUN 18 07/30/2016 12:39 PM   CREATININE 1.08 (H) 08/01/2017 11:37 AM   ALT 36 01/27/2016 09:13 PM   TSH 2.676 11/22/2016 10:40 AM   HGB 12.8 07/03/2017 01:36 PM   HGB 13.1 07/30/2016 12:39 PM     Lipids: No results found for: LDLCALC, LDLDIRECT, CHOL, TRIG, HDL     ASSESSMENT AND PLAN: 1.  Exertional dyspnea and fatigue with significant valvular heart disease and restrictive diastolic dysfunction/chronic diastolic heart failure: Symptoms have improved with increased  diuretic dosage.  This is multifactorial in etiology.  Most recent echocardiogram reviewed above with restrictive diastolic physiology and elevated filling pressures, moderate mitral, mild to moderate aortic, and severe tricuspid regurgitation.  There was moderate to severe aortic stenosis.  Chest x-ray was without CHF.  BNP was mildly elevated at 280. Continue Lasix 40 mg twice daily with supplemental potassium.  She also wears compression stockings. BUN 19 and creatinine 1.08 on 08/01/2017.  I will obtain a follow-up basic metabolic panel.  2.  Coronary disease: Symptomatically stable.  Continue aspirin.  3.  Symptomatic bradycardia status post pacemaker: Normal device function when last checked.  4.  Hypertension:  BP is normal.  No changes to therapy.  5.  Paroxysmal atrial fibrillation:  No longer on amiodarone.  Not an anticoagulation candidate.    Disposition: Follow up 3 to 4 months   Prentice Docker, M.D., F.A.C.C.

## 2017-10-22 NOTE — Addendum Note (Signed)
Addended by: Marlyn Corporal A on: 10/22/2017 02:54 PM   Modules accepted: Orders

## 2017-10-22 NOTE — Patient Instructions (Signed)
Your physician recommends that you schedule a follow-up appointment in:  3-4 months with Dr Purvis Sheffield     Get lab work: BMET     Your physician recommends that you continue on your current medications as directed. Please refer to the Current Medication list given to you today.      No tests ordered today     Thank you for choosing [facility] !

## 2017-11-06 ENCOUNTER — Ambulatory Visit: Payer: Medicare Other | Admitting: Nurse Practitioner

## 2017-11-24 ENCOUNTER — Ambulatory Visit (INDEPENDENT_AMBULATORY_CARE_PROVIDER_SITE_OTHER): Payer: Medicare Other | Admitting: *Deleted

## 2017-11-24 DIAGNOSIS — I739 Peripheral vascular disease, unspecified: Secondary | ICD-10-CM | POA: Diagnosis not present

## 2017-11-24 DIAGNOSIS — M79674 Pain in right toe(s): Secondary | ICD-10-CM | POA: Diagnosis not present

## 2017-11-24 DIAGNOSIS — I442 Atrioventricular block, complete: Secondary | ICD-10-CM | POA: Diagnosis not present

## 2017-11-24 DIAGNOSIS — M79675 Pain in left toe(s): Secondary | ICD-10-CM | POA: Diagnosis not present

## 2017-11-24 DIAGNOSIS — B351 Tinea unguium: Secondary | ICD-10-CM | POA: Diagnosis not present

## 2017-11-24 NOTE — Progress Notes (Signed)
Remote pacemaker transmission.   

## 2017-11-27 ENCOUNTER — Encounter: Payer: Self-pay | Admitting: Cardiology

## 2017-11-28 ENCOUNTER — Emergency Department (HOSPITAL_COMMUNITY)
Admission: EM | Admit: 2017-11-28 | Discharge: 2017-11-28 | Disposition: A | Discharge status: Home / Self Care | Payer: Medicare Other | Attending: Emergency Medicine | Admitting: Emergency Medicine

## 2017-11-28 ENCOUNTER — Encounter (HOSPITAL_COMMUNITY): Payer: Self-pay | Admitting: *Deleted

## 2017-11-28 ENCOUNTER — Emergency Department (HOSPITAL_COMMUNITY): Payer: Medicare Other

## 2017-11-28 DIAGNOSIS — R079 Chest pain, unspecified: Secondary | ICD-10-CM | POA: Diagnosis not present

## 2017-11-28 DIAGNOSIS — I110 Hypertensive heart disease with heart failure: Secondary | ICD-10-CM | POA: Insufficient documentation

## 2017-11-28 DIAGNOSIS — Z7982 Long term (current) use of aspirin: Secondary | ICD-10-CM | POA: Diagnosis not present

## 2017-11-28 DIAGNOSIS — Z79899 Other long term (current) drug therapy: Secondary | ICD-10-CM | POA: Insufficient documentation

## 2017-11-28 DIAGNOSIS — R1013 Epigastric pain: Secondary | ICD-10-CM | POA: Diagnosis not present

## 2017-11-28 DIAGNOSIS — I2510 Atherosclerotic heart disease of native coronary artery without angina pectoris: Secondary | ICD-10-CM | POA: Diagnosis not present

## 2017-11-28 DIAGNOSIS — I509 Heart failure, unspecified: Secondary | ICD-10-CM | POA: Insufficient documentation

## 2017-11-28 DIAGNOSIS — R0789 Other chest pain: Secondary | ICD-10-CM | POA: Insufficient documentation

## 2017-11-28 DIAGNOSIS — Z950 Presence of cardiac pacemaker: Secondary | ICD-10-CM | POA: Diagnosis not present

## 2017-11-28 DIAGNOSIS — R110 Nausea: Secondary | ICD-10-CM | POA: Diagnosis not present

## 2017-11-28 LAB — CBC WITH DIFFERENTIAL/PLATELET
ABS IMMATURE GRANULOCYTES: 0.04 10*3/uL (ref 0.00–0.07)
BASOS PCT: 0 %
Basophils Absolute: 0 10*3/uL (ref 0.0–0.1)
Eosinophils Absolute: 0.1 10*3/uL (ref 0.0–0.5)
Eosinophils Relative: 2 %
HCT: 34 % — ABNORMAL LOW (ref 36.0–46.0)
Hemoglobin: 10.3 g/dL — ABNORMAL LOW (ref 12.0–15.0)
IMMATURE GRANULOCYTES: 0 %
Lymphocytes Relative: 8 %
Lymphs Abs: 0.8 10*3/uL (ref 0.7–4.0)
MCH: 30.8 pg (ref 26.0–34.0)
MCHC: 30.3 g/dL (ref 30.0–36.0)
MCV: 101.8 fL — ABNORMAL HIGH (ref 80.0–100.0)
Monocytes Absolute: 0.3 10*3/uL (ref 0.1–1.0)
Monocytes Relative: 3 %
NEUTROS ABS: 8.3 10*3/uL — AB (ref 1.7–7.7)
NEUTROS PCT: 87 %
PLATELETS: 187 10*3/uL (ref 150–400)
RBC: 3.34 MIL/uL — AB (ref 3.87–5.11)
RDW: 13.4 % (ref 11.5–15.5)
WBC: 9.6 10*3/uL (ref 4.0–10.5)
nRBC: 0 % (ref 0.0–0.2)

## 2017-11-28 LAB — COMPREHENSIVE METABOLIC PANEL
ALBUMIN: 4 g/dL (ref 3.5–5.0)
ALK PHOS: 75 U/L (ref 38–126)
ALT: 13 U/L (ref 0–44)
AST: 24 U/L (ref 15–41)
Anion gap: 10 (ref 5–15)
BILIRUBIN TOTAL: 0.8 mg/dL (ref 0.3–1.2)
BUN: 23 mg/dL (ref 8–23)
CALCIUM: 9.2 mg/dL (ref 8.9–10.3)
CO2: 26 mmol/L (ref 22–32)
Chloride: 104 mmol/L (ref 98–111)
Creatinine, Ser: 1.07 mg/dL — ABNORMAL HIGH (ref 0.44–1.00)
GFR calc Af Amer: 50 mL/min — ABNORMAL LOW (ref >60)
GFR calc non Af Amer: 43 mL/min — ABNORMAL LOW (ref >60)
GLUCOSE: 190 mg/dL — AB (ref 70–99)
Potassium: 3.4 mmol/L — ABNORMAL LOW (ref 3.5–5.1)
SODIUM: 140 mmol/L (ref 135–145)
Total Protein: 7.9 g/dL (ref 6.5–8.1)

## 2017-11-28 LAB — TROPONIN I: Troponin I: 0.03 ng/mL (ref <0.03)

## 2017-11-28 LAB — MAGNESIUM: Magnesium: 2.1 mg/dL (ref 1.7–2.4)

## 2017-11-28 LAB — PROTIME-INR
INR: 1.09
PROTHROMBIN TIME: 14.1 s (ref 11.4–15.2)

## 2017-11-28 LAB — BRAIN NATRIURETIC PEPTIDE: B Natriuretic Peptide: 244 pg/mL — ABNORMAL HIGH (ref 0.0–100.0)

## 2017-11-28 MED ORDER — ASPIRIN 81 MG PO CHEW
324.0000 mg | CHEWABLE_TABLET | Freq: Once | ORAL | Status: AC
  Administered 2017-11-28: 243 mg via ORAL
  Filled 2017-11-28: qty 4

## 2017-11-28 NOTE — Discharge Instructions (Addendum)
As discussed, today's evaluation was generally reassuring. There is some suspicion for your pain coming from irritation of your stomach and/or esophagus. For the next 3 days, please use Pepcid, 10 mg, oral tablet twice daily.  Please be sure to follow-up with your physician and your gastroenterologist, or return here for any concerning changes.

## 2017-11-28 NOTE — ED Notes (Signed)
Date and time results received: 11/28/17 1755 (use smartphrase ".now" to insert current time)  Test: trop Critical Value: 0.03  Name of Provider Notified: lockwood  Orders Received? Or Actions Taken?: see chart

## 2017-11-28 NOTE — ED Notes (Signed)
Pt reports eating beans and potatoes for lunch Fullness and midsternal CP afterwards  Relieved by TUMS   Also per family weaker this week

## 2017-11-28 NOTE — ED Provider Notes (Signed)
[facility] EMERGENCY DEPARTMENT Provider Note   CSN: [id number] Arrival date & time: 11/28/17  1623     History   Chief Complaint Chief Complaint  Patient presents with  . Chest Pain    HPI [name] is a 84 y.o. female.  HPI  Presents with 2 female companion who assist with the HPI. Patient has multiple medical issues including atrial fibrillation, pacemaker, but is generally well, active, healthy. Today, she had an episode of pain in the epigastric area, that began immediately after eating lunch about 5 hours ago. Pain was severe, tight initially, but improved after belching. There is some associated nausea, but no vomiting, no diarrhea, no fever, chills, dyspnea, cough. She does have ongoing mild episodic cough, difficulty clearing secretions, has had evaluation with barium swallow study. No recent medication change, diet change, activity change.  Past Medical History:  Diagnosis Date  . Anemia   . Anxiety   . Atrial fibrillation (HCC)   . DJD (degenerative joint disease)   . Hypertension   . Osteoporosis   . Seasonal allergies   . Sleep apnea     Patient Active Problem List   Diagnosis Date Noted  . GERD (gastroesophageal reflux disease) 05/07/2017  . Hiatal hernia 05/07/2017  . Dysphagia 02/27/2017  . Malnutrition of moderate degree 06/03/2015  . Hospital acquired PNA 05/23/2015  . HCAP (healthcare-associated pneumonia) 05/23/2015  . Aspiration pneumonia of both lower lobes (HCC)   . Palliative care encounter   . DNR (do not resuscitate) discussion   . Acute GI bleeding   . Severe anemia 05/14/2015  . Melena 05/14/2015  . Weakness generalized 05/14/2015  . GI bleed 05/14/2015  . Atrial fibrillation (HCC) 04/26/2015  . Severe tricuspid regurgitation 03/15/2015  . CHF (congestive heart failure) (HCC) 03/08/2015  . Systolic murmur 03/08/2015  . CAD (coronary artery disease) 05/25/2014  . DYSLIPIDEMIA 03/14/2010  . Essential hypertension, benign  03/20/2009  . BRADYCARDIA 03/20/2009  . Cardiac pacemaker in situ 03/20/2009    Past Surgical History:  Procedure Laterality Date  . CHOLECYSTECTOMY    . ESOPHAGOGASTRODUODENOSCOPY N/A 05/16/2015   Procedure: ESOPHAGOGASTRODUODENOSCOPY (EGD);  Surgeon: West Bali, MD;  Location: [facility];  Service: Endoscopy;  Laterality: N/A;  . HEMORRHOID SURGERY    . PACEMAKER INSERTION    . PPM GENERATOR CHANGEOUT N/A 08/02/2016   Procedure: PPM Generator Changeout;  Surgeon: Marinus Maw, MD;  Location: [facility] INVASIVE CV LAB;  Service: Cardiovascular;  Laterality: N/A;  . TOTAL HIP ARTHROPLASTY       OB History   None      Home Medications    Prior to Admission medications   Medication Sig Start Date End Date Taking? Authorizing Provider  acetaminophen (TYLENOL) 500 MG tablet Take 500 mg by mouth at bedtime.   Yes [provider]  amLODipine (NORVASC) 5 MG tablet Take 1 tablet (5 mg total) by mouth daily. 05/25/14  Yes Laqueta Linden, MD  aspirin EC 81 MG tablet Take 1 tablet (81 mg total) by mouth daily. 04/26/16  Yes Laqueta Linden, MD  ferrous sulfate 325 (65 FE) MG EC tablet Take 325 mg by mouth daily.    Yes [provider]  fluticasone (FLONASE) 50 MCG/ACT nasal spray Place 2 sprays into both nostrils daily.   Yes [provider]  furosemide (LASIX) 40 MG tablet Take 1 tablet (40 mg total) by mouth 2 (two) times daily. 07/25/17  Yes Laqueta Linden, MD  lisinopril (  PRINIVIL,ZESTRIL) 40 MG tablet Take 1 tablet (40 mg total) by mouth daily. 04/08/12  Yes Marinus Maw, MD  loratadine (CLARITIN) 10 MG tablet Take 1 tablet by mouth daily 04/09/16  Yes [provider]  pantoprazole (PROTONIX) 40 MG tablet Take 40 mg by mouth daily.   Yes [provider]  potassium chloride SA (K-DUR,KLOR-CON) 20 MEQ tablet Take 1 tablet (20 mEq total) by mouth 2 (two) times daily. 07/25/17  Yes Laqueta Linden, MD    Family History Family  History  Problem Relation Age of Onset  . Colon cancer Father   . Colon cancer Paternal Aunt   . Crohn's disease Son     Social History Social History   Tobacco Use  . Smoking status: Never Smoker  . Smokeless tobacco: Never Used  Substance Use Topics  . Alcohol use: No    Alcohol/week: 0.0 standard drinks  . Drug use: No     Allergies   Patient has no known allergies.   Review of Systems Review of Systems  Constitutional:       Per HPI, otherwise negative  HENT:       Per HPI, otherwise negative  Respiratory:       Per HPI, otherwise negative  Cardiovascular:       Per HPI, otherwise negative  Gastrointestinal: Positive for abdominal pain and nausea. Negative for vomiting.  Endocrine:       Negative aside from HPI  Genitourinary:       Neg aside from HPI   Musculoskeletal:       Per HPI, otherwise negative  Skin: Negative.   Neurological: Negative for syncope.     Physical Exam Updated Vital Signs BP (!) 113/51   Pulse 66   Temp 97.6 F (36.4 C) (Oral)   Resp 14   Ht 5\' 3"  (1.6 m)   Wt 66.7 kg   SpO2 100%   BMI 26.04 kg/m   Physical Exam  Constitutional: She is oriented to person, place, and time. She appears well-developed and well-nourished. No distress.  HENT:  Head: Normocephalic and atraumatic.  Eyes: Conjunctivae and EOM are normal.  Cardiovascular: Normal rate and regular rhythm.  Pulmonary/Chest: Effort normal and breath sounds normal. No stridor. No respiratory distress.  Pacemaker, left upper chest, unremarkable  Abdominal: She exhibits no distension.  No tenderness about the subxiphoid region, though the patient notes that this is where it hurt just after eating lunch earlier today.  Musculoskeletal: She exhibits no edema.  Neurological: She is alert and oriented to person, place, and time. No cranial nerve deficit.  Skin: Skin is warm and dry.  Psychiatric: She has a normal mood and affect.  Nursing note and vitals  reviewed.    ED Treatments / Results  Labs (all labs ordered are listed, but only abnormal results are displayed) Labs Reviewed  COMPREHENSIVE METABOLIC PANEL - Abnormal; Notable for the following components:      Result Value   Potassium 3.4 (*)    Glucose, Bld 190 (*)    Creatinine, Ser 1.07 (*)    GFR calc non Af Amer 43 (*)    GFR calc Af Amer 50 (*)    All other components within normal limits  TROPONIN I - Abnormal; Notable for the following components:   Troponin I 0.03 (*)    All other components within normal limits  BRAIN NATRIURETIC PEPTIDE - Abnormal; Notable for the following components:   B Natriuretic Peptide 244.0 (*)  All other components within normal limits  CBC WITH DIFFERENTIAL/PLATELET - Abnormal; Notable for the following components:   RBC 3.34 (*)    Hemoglobin 10.3 (*)    HCT 34.0 (*)    MCV 101.8 (*)    Neutro Abs 8.3 (*)    All other components within normal limits  MAGNESIUM  PROTIME-INR    EKG EKG Interpretation  Date/Time:  Friday November 28 2017 16:32:36 EST Ventricular Rate:  80 PR Interval:    QRS Duration: 152 QT Interval:  458 QTC Calculation: 519 R Axis:   -171 Text Interpretation:  A-V dual-paced complexes w/ some inhibition No further analysis attempted due to paced rhythm Abnormal ekg Confirmed by Gerhard Munch [phone]) on 11/28/2017 4:38:10 PM   Radiology Dg Chest Portable 1 View  Result Date: 11/28/2017 CLINICAL DATA:  Chest pain EXAM: PORTABLE CHEST 1 VIEW COMPARISON:  07/03/2017 FINDINGS: Marked cardiopericardial enlargement. There is also a superimposed sizable hiatal hernia. Dual-chamber pacer leads from the left in stable position. There is no edema, consolidation, effusion, or pneumothorax. IMPRESSION: No acute finding. Cardiomegaly and large hiatal hernia. Electronically Signed   By: Marnee Spring M.D.   On: 11/28/2017 16:52    Procedures Procedures (including critical care time)  Medications Ordered in  ED Medications  aspirin chewable tablet 324 mg (243 mg Oral Given 11/28/17 1727)     Initial Impression / Assessment and Plan / ED Course  I have reviewed the triage vital signs and the nursing notes.  Pertinent labs & imaging results that were available during my care of the patient were reviewed by me and considered in my medical decision making (see chart for details).     6:52 PM Patient awake alert, has had no return of her upper abdominal discomfort. We discussed all findings, and I reviewed the patient's labs, x-ray, and discussed with patient and her family members. We discussed possibility of GI etiology given the patient's improvement with belching, known history of dysphasia. Patient does have borderline positive troponin, though she has this chronically, has no ongoing pain, no evidence for ischemia on EKG, no complaints.  We discussed possibilities for other causes for her pain, and admission for monitoring, observation was offered, though without any ongoing complaints, patient elected, reasonably so, to follow-up with her primary care physician and gastroenterologist. Low suspicion for atypical ACS, no evidence for distress, PE, pneumonia.   Final Clinical Impressions(s) / ED Diagnoses  Atypical chest pain   Gerhard Munch, MD 11/28/17 [phone]

## 2017-11-28 NOTE — ED Notes (Signed)
Family member to desk asking how much longer

## 2017-11-28 NOTE — ED Triage Notes (Signed)
Pt with mid cp that started today, denies hx of CP.  Denies sob but family member states pt has been labored.  Denies emesis but + nausea

## 2017-12-02 ENCOUNTER — Telehealth: Payer: Self-pay | Admitting: Cardiovascular Disease

## 2017-12-02 NOTE — Telephone Encounter (Signed)
Pt's daughter, [name] lvm stating pt is just not feeling well and wanting pt to be seen by Dr. Purvis Sheffield.   Please give daughter, [name] a call @ [phone]

## 2017-12-03 ENCOUNTER — Encounter (HOSPITAL_COMMUNITY): Payer: Self-pay | Admitting: Emergency Medicine

## 2017-12-03 ENCOUNTER — Inpatient Hospital Stay (HOSPITAL_COMMUNITY)
Admission: EM | Admit: 2017-12-03 | Discharge: 2017-12-06 | DRG: 378 | Disposition: A | Discharge status: Home / Self Care | Payer: Medicare Other | Attending: Family Medicine | Admitting: Family Medicine

## 2017-12-03 ENCOUNTER — Emergency Department (HOSPITAL_COMMUNITY): Payer: Medicare Other

## 2017-12-03 ENCOUNTER — Other Ambulatory Visit: Payer: Self-pay

## 2017-12-03 DIAGNOSIS — K253 Acute gastric ulcer without hemorrhage or perforation: Secondary | ICD-10-CM | POA: Diagnosis not present

## 2017-12-03 DIAGNOSIS — K922 Gastrointestinal hemorrhage, unspecified: Secondary | ICD-10-CM | POA: Diagnosis not present

## 2017-12-03 DIAGNOSIS — I071 Rheumatic tricuspid insufficiency: Secondary | ICD-10-CM

## 2017-12-03 DIAGNOSIS — K254 Chronic or unspecified gastric ulcer with hemorrhage: Principal | ICD-10-CM | POA: Diagnosis present

## 2017-12-03 DIAGNOSIS — I5032 Chronic diastolic (congestive) heart failure: Secondary | ICD-10-CM | POA: Diagnosis present

## 2017-12-03 DIAGNOSIS — M810 Age-related osteoporosis without current pathological fracture: Secondary | ICD-10-CM | POA: Diagnosis present

## 2017-12-03 DIAGNOSIS — K3189 Other diseases of stomach and duodenum: Secondary | ICD-10-CM | POA: Diagnosis not present

## 2017-12-03 DIAGNOSIS — K259 Gastric ulcer, unspecified as acute or chronic, without hemorrhage or perforation: Secondary | ICD-10-CM

## 2017-12-03 DIAGNOSIS — R001 Bradycardia, unspecified: Secondary | ICD-10-CM | POA: Diagnosis present

## 2017-12-03 DIAGNOSIS — Z79899 Other long term (current) drug therapy: Secondary | ICD-10-CM

## 2017-12-03 DIAGNOSIS — G4730 Sleep apnea, unspecified: Secondary | ICD-10-CM | POA: Diagnosis present

## 2017-12-03 DIAGNOSIS — Z950 Presence of cardiac pacemaker: Secondary | ICD-10-CM

## 2017-12-03 DIAGNOSIS — Z96649 Presence of unspecified artificial hip joint: Secondary | ICD-10-CM | POA: Diagnosis present

## 2017-12-03 DIAGNOSIS — R404 Transient alteration of awareness: Secondary | ICD-10-CM | POA: Diagnosis not present

## 2017-12-03 DIAGNOSIS — K317 Polyp of stomach and duodenum: Secondary | ICD-10-CM | POA: Diagnosis present

## 2017-12-03 DIAGNOSIS — I350 Nonrheumatic aortic (valve) stenosis: Secondary | ICD-10-CM | POA: Diagnosis present

## 2017-12-03 DIAGNOSIS — K224 Dyskinesia of esophagus: Secondary | ICD-10-CM | POA: Diagnosis present

## 2017-12-03 DIAGNOSIS — I110 Hypertensive heart disease with heart failure: Secondary | ICD-10-CM | POA: Diagnosis present

## 2017-12-03 DIAGNOSIS — I509 Heart failure, unspecified: Secondary | ICD-10-CM

## 2017-12-03 DIAGNOSIS — I2510 Atherosclerotic heart disease of native coronary artery without angina pectoris: Secondary | ICD-10-CM

## 2017-12-03 DIAGNOSIS — K921 Melena: Secondary | ICD-10-CM

## 2017-12-03 DIAGNOSIS — D62 Acute posthemorrhagic anemia: Secondary | ICD-10-CM | POA: Diagnosis present

## 2017-12-03 DIAGNOSIS — D649 Anemia, unspecified: Secondary | ICD-10-CM | POA: Diagnosis not present

## 2017-12-03 DIAGNOSIS — I480 Paroxysmal atrial fibrillation: Secondary | ICD-10-CM

## 2017-12-03 DIAGNOSIS — K920 Hematemesis: Secondary | ICD-10-CM | POA: Diagnosis not present

## 2017-12-03 DIAGNOSIS — N179 Acute kidney failure, unspecified: Secondary | ICD-10-CM | POA: Diagnosis present

## 2017-12-03 DIAGNOSIS — E876 Hypokalemia: Secondary | ICD-10-CM | POA: Diagnosis present

## 2017-12-03 DIAGNOSIS — I10 Essential (primary) hypertension: Secondary | ICD-10-CM | POA: Diagnosis not present

## 2017-12-03 DIAGNOSIS — J9811 Atelectasis: Secondary | ICD-10-CM | POA: Diagnosis present

## 2017-12-03 DIAGNOSIS — R269 Unspecified abnormalities of gait and mobility: Secondary | ICD-10-CM | POA: Diagnosis present

## 2017-12-03 DIAGNOSIS — Z7951 Long term (current) use of inhaled steroids: Secondary | ICD-10-CM | POA: Diagnosis not present

## 2017-12-03 DIAGNOSIS — K219 Gastro-esophageal reflux disease without esophagitis: Secondary | ICD-10-CM | POA: Diagnosis present

## 2017-12-03 DIAGNOSIS — Z7982 Long term (current) use of aspirin: Secondary | ICD-10-CM

## 2017-12-03 DIAGNOSIS — R195 Other fecal abnormalities: Secondary | ICD-10-CM | POA: Diagnosis not present

## 2017-12-03 DIAGNOSIS — I4891 Unspecified atrial fibrillation: Secondary | ICD-10-CM | POA: Diagnosis present

## 2017-12-03 DIAGNOSIS — R531 Weakness: Secondary | ICD-10-CM

## 2017-12-03 DIAGNOSIS — K449 Diaphragmatic hernia without obstruction or gangrene: Secondary | ICD-10-CM | POA: Diagnosis present

## 2017-12-03 DIAGNOSIS — R1310 Dysphagia, unspecified: Secondary | ICD-10-CM | POA: Diagnosis present

## 2017-12-03 DIAGNOSIS — R58 Hemorrhage, not elsewhere classified: Secondary | ICD-10-CM | POA: Diagnosis not present

## 2017-12-03 HISTORY — DX: Gastrointestinal hemorrhage, unspecified: K92.2

## 2017-12-03 HISTORY — DX: Gastritis, unspecified, without bleeding: K29.70

## 2017-12-03 LAB — CBC WITH DIFFERENTIAL/PLATELET
Abs Immature Granulocytes: 0.02 10*3/uL (ref 0.00–0.07)
Basophils Absolute: 0 10*3/uL (ref 0.0–0.1)
Basophils Relative: 0 %
EOS ABS: 0.2 10*3/uL (ref 0.0–0.5)
EOS PCT: 3 %
HEMATOCRIT: 26.8 % — AB (ref 36.0–46.0)
HEMOGLOBIN: 8.2 g/dL — AB (ref 12.0–15.0)
Immature Granulocytes: 0 %
LYMPHS ABS: 1.1 10*3/uL (ref 0.7–4.0)
LYMPHS PCT: 16 %
MCH: 30.7 pg (ref 26.0–34.0)
MCHC: 30.6 g/dL (ref 30.0–36.0)
MCV: 100.4 fL — AB (ref 80.0–100.0)
MONO ABS: 0.4 10*3/uL (ref 0.1–1.0)
MONOS PCT: 6 %
NRBC: 0 % (ref 0.0–0.2)
Neutro Abs: 5 10*3/uL (ref 1.7–7.7)
Neutrophils Relative %: 75 %
Platelets: 185 10*3/uL (ref 150–400)
RBC: 2.67 MIL/uL — ABNORMAL LOW (ref 3.87–5.11)
RDW: 13.8 % (ref 11.5–15.5)
WBC: 6.7 10*3/uL (ref 4.0–10.5)

## 2017-12-03 LAB — RETICULOCYTES
Immature Retic Fract: 25.7 % — ABNORMAL HIGH (ref 2.3–15.9)
RBC.: 2.7 MIL/uL — AB (ref 3.87–5.11)
RETIC COUNT ABSOLUTE: 87.8 10*3/uL (ref 19.0–186.0)
RETIC CT PCT: 3.3 % — AB (ref 0.4–3.1)

## 2017-12-03 LAB — COMPREHENSIVE METABOLIC PANEL
ALT: 12 U/L (ref 0–44)
AST: 22 U/L (ref 15–41)
Albumin: 3.7 g/dL (ref 3.5–5.0)
Alkaline Phosphatase: 64 U/L (ref 38–126)
Anion gap: 9 (ref 5–15)
BUN: 38 mg/dL — AB (ref 8–23)
CHLORIDE: 107 mmol/L (ref 98–111)
CO2: 26 mmol/L (ref 22–32)
CREATININE: 1.17 mg/dL — AB (ref 0.44–1.00)
Calcium: 9.2 mg/dL (ref 8.9–10.3)
GFR, EST AFRICAN AMERICAN: 44 mL/min — AB (ref >60)
GFR, EST NON AFRICAN AMERICAN: 38 mL/min — AB (ref >60)
Glucose, Bld: 124 mg/dL — ABNORMAL HIGH (ref 70–99)
Potassium: 3.7 mmol/L (ref 3.5–5.1)
Sodium: 142 mmol/L (ref 135–145)
TOTAL PROTEIN: 7.2 g/dL (ref 6.5–8.1)
Total Bilirubin: 0.6 mg/dL (ref 0.3–1.2)

## 2017-12-03 LAB — URINALYSIS, ROUTINE W REFLEX MICROSCOPIC
Bilirubin Urine: NEGATIVE
GLUCOSE, UA: NEGATIVE mg/dL
HGB URINE DIPSTICK: NEGATIVE
Ketones, ur: NEGATIVE mg/dL
Leukocytes, UA: NEGATIVE
Nitrite: NEGATIVE
PROTEIN: NEGATIVE mg/dL
SPECIFIC GRAVITY, URINE: 1.02 (ref 1.005–1.030)
pH: 5 (ref 5.0–8.0)

## 2017-12-03 LAB — HEMOGLOBIN AND HEMATOCRIT, BLOOD
HCT: 26 % — ABNORMAL LOW (ref 36.0–46.0)
Hemoglobin: 7.9 g/dL — ABNORMAL LOW (ref 12.0–15.0)

## 2017-12-03 LAB — IRON AND TIBC
Iron: 27 ug/dL — ABNORMAL LOW (ref 28–170)
SATURATION RATIOS: 7 % — AB (ref 10.4–31.8)
TIBC: 392 ug/dL (ref 250–450)
UIBC: 365 ug/dL

## 2017-12-03 LAB — POC OCCULT BLOOD, ED: Fecal Occult Bld: POSITIVE — AB

## 2017-12-03 LAB — FOLATE: Folate: 20.5 ng/mL (ref >5.9)

## 2017-12-03 LAB — PROTIME-INR
INR: 1.12
Prothrombin Time: 14.3 seconds (ref 11.4–15.2)

## 2017-12-03 LAB — LACTIC ACID, PLASMA
Lactic Acid, Venous: 1.5 mmol/L (ref 0.5–1.9)
Lactic Acid, Venous: 1.3 mmol/L (ref 0.5–1.9)

## 2017-12-03 LAB — VITAMIN B12: Vitamin B-12: 323 pg/mL (ref 180–914)

## 2017-12-03 LAB — TROPONIN I: TROPONIN I: 0.03 ng/mL — AB (ref <0.03)

## 2017-12-03 LAB — FERRITIN: FERRITIN: 37 ng/mL (ref 11–307)

## 2017-12-03 MED ORDER — ACETAMINOPHEN 650 MG RE SUPP: 650.0000 mg | Freq: Four times a day (QID) | RECTAL | Status: DC | PRN

## 2017-12-03 MED ORDER — ONDANSETRON HCL 4 MG PO TABS: 4.0000 mg | ORAL_TABLET | Freq: Four times a day (QID) | ORAL | Status: DC | PRN

## 2017-12-03 MED ORDER — PANTOPRAZOLE SODIUM 40 MG IV SOLR
40.0000 mg | Freq: Two times a day (BID) | INTRAVENOUS | Status: DC
  Administered 2017-12-03 – 2017-12-06 (×6): 40 mg via INTRAVENOUS
  Filled 2017-12-03 (×6): qty 40

## 2017-12-03 MED ORDER — FLUTICASONE PROPIONATE 50 MCG/ACT NA SUSP
2.0000 | Freq: Every day | NASAL | Status: DC
  Administered 2017-12-04 – 2017-12-06 (×3): 2 via NASAL
  Filled 2017-12-03 (×3): qty 16

## 2017-12-03 MED ORDER — FERROUS SULFATE 325 (65 FE) MG PO TBEC
325.0000 mg | DELAYED_RELEASE_TABLET | Freq: Every day | ORAL | Status: DC
  Filled 2017-12-03 (×2): qty 1

## 2017-12-03 MED ORDER — SODIUM CHLORIDE 0.9% FLUSH
3.0000 mL | Freq: Two times a day (BID) | INTRAVENOUS | Status: DC
  Administered 2017-12-03 – 2017-12-06 (×7): 3 mL via INTRAVENOUS

## 2017-12-03 MED ORDER — FUROSEMIDE 40 MG PO TABS
40.0000 mg | ORAL_TABLET | Freq: Two times a day (BID) | ORAL | Status: DC
  Administered 2017-12-03 – 2017-12-06 (×6): 40 mg via ORAL
  Filled 2017-12-03 (×6): qty 1

## 2017-12-03 MED ORDER — LORATADINE 10 MG PO TABS
10.0000 mg | ORAL_TABLET | Freq: Every day | ORAL | Status: DC
  Administered 2017-12-05 – 2017-12-06 (×2): 10 mg via ORAL
  Filled 2017-12-03 (×2): qty 1

## 2017-12-03 MED ORDER — TRAZODONE HCL 50 MG PO TABS
25.0000 mg | ORAL_TABLET | Freq: Every evening | ORAL | Status: DC | PRN
  Administered 2017-12-03 – 2017-12-05 (×2): 25 mg via ORAL
  Filled 2017-12-03 (×2): qty 1

## 2017-12-03 MED ORDER — ACETAMINOPHEN 325 MG PO TABS: 650.0000 mg | ORAL_TABLET | Freq: Four times a day (QID) | ORAL | Status: DC | PRN

## 2017-12-03 MED ORDER — SODIUM CHLORIDE 0.9 % IV SOLN
250.0000 mL | INTRAVENOUS | Status: DC | PRN
  Administered 2017-12-04: 250 mL via INTRAVENOUS

## 2017-12-03 MED ORDER — SODIUM CHLORIDE 0.9 % IV SOLN
INTRAVENOUS | Status: DC
  Administered 2017-12-03: 11:00:00 via INTRAVENOUS

## 2017-12-03 MED ORDER — PANTOPRAZOLE SODIUM 40 MG IV SOLR
40.0000 mg | Freq: Once | INTRAVENOUS | Status: AC
  Administered 2017-12-03: 40 mg via INTRAVENOUS
  Filled 2017-12-03: qty 40

## 2017-12-03 MED ORDER — SODIUM CHLORIDE 0.9% FLUSH
3.0000 mL | INTRAVENOUS | Status: DC | PRN
  Administered 2017-12-03 – 2017-12-05 (×2): 3 mL via INTRAVENOUS
  Filled 2017-12-03 (×2): qty 3

## 2017-12-03 MED ORDER — ONDANSETRON HCL 4 MG/2ML IJ SOLN
4.0000 mg | Freq: Four times a day (QID) | INTRAMUSCULAR | Status: DC | PRN
  Administered 2017-12-05: 4 mg via INTRAVENOUS
  Filled 2017-12-03: qty 2

## 2017-12-03 MED ORDER — PANTOPRAZOLE SODIUM 40 MG IV SOLR: 40.0000 mg | INTRAVENOUS | Status: DC

## 2017-12-03 MED ORDER — AMLODIPINE BESYLATE 5 MG PO TABS
5.0000 mg | ORAL_TABLET | Freq: Every day | ORAL | Status: DC
  Administered 2017-12-03 – 2017-12-06 (×3): 5 mg via ORAL
  Filled 2017-12-03 (×3): qty 1

## 2017-12-03 NOTE — Progress Notes (Signed)
CRITICAL VALUE ALERT  Critical Value:  HGB 7.9  Date & Time Notied:  12/03/17 1945  Provider Notified: Wellsite geologist (Rehman aware)  Orders Received/Actions taken: GI following up

## 2017-12-03 NOTE — ED Triage Notes (Signed)
Per ems pt family reports incrEASED weakness with stools looking darker than normal as she takes an iron supplement. Pt denies pain. Just states "I have no energy"

## 2017-12-03 NOTE — ED Provider Notes (Addendum)
[facility] EMERGENCY DEPARTMENT Provider Note   CSN: [id number] Arrival date & time: 12/03/17  9833     History   Chief Complaint Chief Complaint  Patient presents with  . Weakness    HPI [name] is a 84 y.o. female.  HPI  Pt was seen at 0910. Per pt and her family, c/o gradual onset and worsening of persistent generalized weakness for the past 1 week. Has been associated with "darker stools than usual." Family states "the last time she was like this her blood count was low." Pt has hx GIB while on anticoagulant several years ago. Denies CP/palpitations, no SOB/cough, no abd pain, no back pain, no N/V/D, no fevers, no rash, no focal motor weakness, no tingling/numbness in extremities.    Past Medical History:  Diagnosis Date  . Anemia   . Anxiety   . Atrial fibrillation (HCC)   . DJD (degenerative joint disease)   . Gastritis   . GI bleed   . Hypertension   . Osteoporosis   . Seasonal allergies   . Sleep apnea     Patient Active Problem List   Diagnosis Date Noted  . GERD (gastroesophageal reflux disease) 05/07/2017  . Hiatal hernia 05/07/2017  . Dysphagia 02/27/2017  . Malnutrition of moderate degree 06/03/2015  . Hospital acquired PNA 05/23/2015  . HCAP (healthcare-associated pneumonia) 05/23/2015  . Aspiration pneumonia of both lower lobes (HCC)   . Palliative care encounter   . DNR (do not resuscitate) discussion   . Acute GI bleeding   . Severe anemia 05/14/2015  . Melena 05/14/2015  . Weakness generalized 05/14/2015  . GI bleed 05/14/2015  . Atrial fibrillation (HCC) 04/26/2015  . Severe tricuspid regurgitation 03/15/2015  . CHF (congestive heart failure) (HCC) 03/08/2015  . Systolic murmur 03/08/2015  . CAD (coronary artery disease) 05/25/2014  . DYSLIPIDEMIA 03/14/2010  . Essential hypertension, benign 03/20/2009  . BRADYCARDIA 03/20/2009  . Cardiac pacemaker in situ 03/20/2009    Past Surgical History:  Procedure Laterality Date  .  CHOLECYSTECTOMY    . ESOPHAGOGASTRODUODENOSCOPY N/A 05/16/2015   Procedure: ESOPHAGOGASTRODUODENOSCOPY (EGD);  Surgeon: West Bali, MD;  Location: [facility];  Service: Endoscopy;  Laterality: N/A;  . HEMORRHOID SURGERY    . PACEMAKER INSERTION    . PPM GENERATOR CHANGEOUT N/A 08/02/2016   Procedure: PPM Generator Changeout;  Surgeon: Marinus Maw, MD;  Location[facility] INVASIVE CV LAB;  Service: Cardiovascular;  Laterality: N/A;  . TOTAL HIP ARTHROPLASTY       OB History   None      Home Medications    Prior to Admission medications   Medication Sig Start Date End Date Taking? Authorizing Provider  acetaminophen (TYLENOL) 500 MG tablet Take 500 mg by mouth at bedtime.   Yes [provider]  amLODipine (NORVASC) 5 MG tablet Take 1 tablet (5 mg total) by mouth daily. 05/25/14  Yes Laqueta Linden, MD  ammonium lactate (LAC-HYDRIN) 12 % lotion Apply 1 application topically daily as needed. 11/24/17  Yes [provider]  aspirin EC 81 MG tablet Take 1 tablet (81 mg total) by mouth daily. 04/26/16  Yes Laqueta Linden, MD  ferrous sulfate 325 (65 FE) MG EC tablet Take 325 mg by mouth daily.    Yes [provider]  fluticasone (FLONASE) 50 MCG/ACT nasal spray Place 2 sprays into both nostrils daily.   Yes [provider]  furosemide (LASIX) 40 MG tablet Take 1 tablet (40 mg total)  by mouth 2 (two) times daily. 07/25/17  Yes Laqueta Linden, MD  lisinopril (PRINIVIL,ZESTRIL) 40 MG tablet Take 1 tablet (40 mg total) by mouth daily. 04/08/12  Yes Marinus Maw, MD  loratadine (CLARITIN) 10 MG tablet Take 1 tablet by mouth daily 04/09/16  Yes [provider]  pantoprazole (PROTONIX) 40 MG tablet Take 40 mg by mouth daily.   Yes [provider]  potassium chloride SA (K-DUR,KLOR-CON) 20 MEQ tablet Take 1 tablet (20 mEq total) by mouth 2 (two) times daily. 07/25/17  Yes Laqueta Linden, MD    Family History Family History    Problem Relation Age of Onset  . Colon cancer Father   . Colon cancer Paternal Aunt   . Crohn's disease Son     Social History Social History   Tobacco Use  . Smoking status: Never Smoker  . Smokeless tobacco: Never Used  Substance Use Topics  . Alcohol use: No    Alcohol/week: 0.0 standard drinks  . Drug use: No     Allergies   Patient has no known allergies.   Review of Systems Review of Systems ROS: Statement: All systems negative except as marked or noted in the HPI; Constitutional: Negative for fever and chills. ; ; Eyes: Negative for eye pain, redness and discharge. ; ; ENMT: Negative for ear pain, hoarseness, nasal congestion, sinus pressure and sore throat. ; ; Cardiovascular: Negative for chest pain, palpitations, diaphoresis, dyspnea and peripheral edema. ; ; Respiratory: Negative for cough, wheezing and stridor. ; ; Gastrointestinal: Negative for nausea, vomiting, diarrhea, abdominal pain, blood in stool, hematemesis, jaundice and rectal bleeding. . ; ; Genitourinary: Negative for dysuria, flank pain and hematuria. ; ; Musculoskeletal: Negative for back pain and neck pain. Negative for swelling and trauma.; ; Skin: Negative for pruritus, rash, abrasions, blisters, bruising and skin lesion.; ; Neuro: Negative for headache, lightheadedness and neck stiffness. Negative for weakness, altered level of consciousness, altered mental status, extremity weakness, paresthesias, involuntary movement, seizure and syncope.       Physical Exam Updated Vital Signs BP (!) 125/54 (BP Location: Left Arm)   Pulse 70   Temp 98 F (36.7 C) (Oral)   Resp 16   Ht 5\' 3"  (1.6 m)   Wt 66.6 kg   SpO2 98%   BMI 26.01 kg/m    09:25 Orthostatic Vital Signs TH  Orthostatic Lying   BP- Lying: 118/59   Pulse- Lying: 45Abnormal        Orthostatic Sitting  BP- Sitting: 124/61   Pulse- Sitting: 61       Orthostatic Standing at 0 minutes  BP- Standing at 0 minutes: 129/61   Pulse-  Standing at 0 minutes: 75      Physical Exam 0915: Physical examination:  Nursing notes reviewed; Vital signs and O2 SAT reviewed;  Constitutional: Well developed, Well nourished, In no acute distress; Head:  Normocephalic, atraumatic; Eyes: EOMI, PERRL, No scleral icterus; ENMT: Mouth and pharynx normal, Mucous membranes dry; Neck: Supple, Full range of motion, No lymphadenopathy; Cardiovascular: Regular rate and rhythm, No gallop; Respiratory: Breath sounds clear & equal bilaterally, No wheezes.  Speaking full sentences with ease, Normal respiratory effort/excursion; Chest: Nontender, Movement normal; Abdomen: Soft, Nontender, Nondistended, Normal bowel sounds. Rectal exam performed w/permission of pt and ED RN chaperone present.  Anal tone normal.  Non-tender, soft black stool in rectal vault, heme positive.  No fissures, +external hemorrhoids without thrombosis or bleeding, no palp masses.; Genitourinary: No CVA tenderness; Extremities:  Peripheral pulses normal, No tenderness, No edema, No calf edema or asymmetry.; Neuro: AA&Ox3, Major CN grossly intact.  Speech clear. No gross focal motor or sensory deficits in extremities.; Skin: Color normal, Warm, Dry.    ED Treatments / Results  Labs (all labs ordered are listed, but only abnormal results are displayed)   EKG EKG Interpretation  Date/Time:  Wednesday December 03 2017 09:08:41 EST Ventricular Rate:  78 PR Interval:    QRS Duration: 154 QT Interval:  435 QTC Calculation: 589 R Axis:   105 Text Interpretation:  Ventricular-paced complexes No further analysis attempted due to paced rhythm When compared with ECG of 11/28/2017 No significant change was found Confirmed by Samuel Jester [phone]) on 12/03/2017 9:27:20 AM   Radiology   Procedures Procedures (including critical care time)  Medications Ordered in ED Medications  pantoprazole (PROTONIX) injection 40 mg (has no administration in time range)     Initial Impression  / Assessment and Plan / ED Course  I have reviewed the triage vital signs and the nursing notes.  Pertinent labs & imaging results that were available during my care of the patient were reviewed by me and considered in my medical decision making (see chart for details).  MDM Reviewed: previous chart, nursing note and vitals Reviewed previous: labs and ECG Interpretation: labs, ECG and x-ray Total time providing critical care: 30-74 minutes. This excludes time spent performing separately reportable procedures and services. Consults: gastrointestinal and admitting MD   CRITICAL CARE Performed by: Samuel Jester Total critical care time: 35 minutes Critical care time was exclusive of separately billable procedures and treating other patients. Critical care was necessary to treat or prevent imminent or life-threatening deterioration. Critical care was time spent personally by me on the following activities: development of treatment plan with patient and/or surrogate as well as nursing, discussions with consultants, evaluation of patient's response to treatment, examination of patient, obtaining history from patient or surrogate, ordering and performing treatments and interventions, ordering and review of laboratory studies, ordering and review of radiographic studies, pulse oximetry and re-evaluation of patient's condition.    Results for orders placed or performed during the hospital encounter of 12/03/17  Comprehensive metabolic panel  Result Value Ref Range   Sodium 142 135 - 145 mmol/L   Potassium 3.7 3.5 - 5.1 mmol/L   Chloride 107 98 - 111 mmol/L   CO2 26 22 - 32 mmol/L   Glucose, Bld 124 (H) 70 - 99 mg/dL   BUN 38 (H) 8 - 23 mg/dL   Creatinine, Ser 6.04 (H) 0.44 - 1.00 mg/dL   Calcium 9.2 8.9 - 54.0 mg/dL   Total Protein 7.2 6.5 - 8.1 g/dL   Albumin 3.7 3.5 - 5.0 g/dL   AST 22 15 - 41 U/L   ALT 12 0 - 44 U/L   Alkaline Phosphatase 64 38 - 126 U/L   Total Bilirubin 0.6 0.3 -  1.2 mg/dL   GFR calc non Af Amer 38 (L) >60 mL/min   GFR calc Af Amer 44 (L) >60 mL/min   Anion gap 9 5 - 15  Troponin I - Once  Result Value Ref Range   Troponin I 0.03 (HH) <0.03 ng/mL  Lactic acid, plasma  Result Value Ref Range   Lactic Acid, Venous 1.5 0.5 - 1.9 mmol/L  CBC with Differential  Result Value Ref Range   WBC 6.7 4.0 - 10.5 K/uL   RBC 2.67 (L) 3.87 - 5.11 MIL/uL   Hemoglobin 8.2 (L)  12.0 - 15.0 g/dL   HCT 40.9 (L) 81.1 - 91.4 %   MCV 100.4 (H) 80.0 - 100.0 fL   MCH 30.7 26.0 - 34.0 pg   MCHC 30.6 30.0 - 36.0 g/dL   RDW 78.2 95.6 - 21.3 %   Platelets 185 150 - 400 K/uL   nRBC 0.0 0.0 - 0.2 %   Neutrophils Relative % 75 %   Neutro Abs 5.0 1.7 - 7.7 K/uL   Lymphocytes Relative 16 %   Lymphs Abs 1.1 0.7 - 4.0 K/uL   Monocytes Relative 6 %   Monocytes Absolute 0.4 0.1 - 1.0 K/uL   Eosinophils Relative 3 %   Eosinophils Absolute 0.2 0.0 - 0.5 K/uL   Basophils Relative 0 %   Basophils Absolute 0.0 0.0 - 0.1 K/uL   Immature Granulocytes 0 %   Abs Immature Granulocytes 0.02 0.00 - 0.07 K/uL  Protime-INR  Result Value Ref Range   Prothrombin Time 14.3 11.4 - 15.2 seconds   INR 1.12   POC occult blood, ED  Result Value Ref Range   Fecal Occult Bld POSITIVE (A) NEGATIVE  Type and screen [facility]  Result Value Ref Range   ABO/RH(D) O POS    Antibody Screen NEG    Sample Expiration      12/06/2017 Performed at [facility], [address], [place], [place] [address]    Dg Chest Port 1 View Result Date: 12/03/2017 CLINICAL DATA:  Weakness EXAM: PORTABLE CHEST 1 VIEW COMPARISON:  11/28/2017 FINDINGS: Left pacer remains in place, unchanged. Cardiomegaly. Large hiatal hernia. Minimal bibasilar atelectasis. No overt edema. Possible small effusions. No acute bony abnormality. IMPRESSION: Cardiomegaly with bibasilar atelectasis and probable small effusions. Large hiatal hernia. Electronically Signed   By: Charlett Nose M.D.   On: 12/03/2017 09:38    Results  for ABIGIAL, [place] (MRN [id number]) as of 12/03/2017 10:50  Ref. Range 07/30/2016 12:39 11/22/2016 10:40 07/03/2017 13:36 11/28/2017 17:07 12/03/2017 09:40  Hemoglobin Latest Ref Range: 12.0 - 15.0 g/dL 84.1 32.4 (L) 40.1 02.7 (L) 8.2 (L)  HCT Latest Ref Range: 36.0 - 46.0 % 38.5 33.8 (L) 39.4 34.0 (L) 26.8 (L)    1100:  H/H drop from baseline, including 5 days; type and screen ordered. Pt kept NPO.   T/C returned from GI Dr. Karilyn Cota, case discussed, including:  HPI, pertinent PM/SHx, VS/PE, dx testing, ED course and treatment:  Agreeable to consult.   1120: Dx and testing d/w pt and family.  Questions answered.  Verb understanding, agreeable to admit.  T/C returned from [facility] Dr. Laural Benes, case discussed, including:  HPI, pertinent PM/SHx, VS/PE, dx testing, ED course and treatment:  Agreeable to admit.         Final Clinical Impressions(s) / ED Diagnoses   Final diagnoses:  None    ED Discharge Orders    None       Samuel Jester, DO 12/05/17 0743    Samuel Jester, DO 12/15/17 2042

## 2017-12-03 NOTE — H&P (Addendum)
History and Physical  [name] YQM:[id number] DOB: 1920-03-30 DOA: 12/03/2017  Referring physician: Clarene Duke PCP: Avon Gully, MD   Chief Complaint: weakness  HPI: [name] is a 84 y.o. female with severe significant valvular heart disease and restrictive diastolic/chronic diastolic heart failure, atrial fibrillation, symptomatic bradycardia with pacemaker, CAD, advanced age presented to ED with family complaining of 1 week of progressive fatigue and black tarry stools.  Pt does take iron supplements and has black stools regularly but family reports that her stools have recently become more dark.  The patient had been seen in ED a few days ago with chest discomfort and fatigue and sent home.  Pt had a hg of 10 on 11/28/17 and it has dropped down to 8 today.  The patient's fatigue has only continued to get worse.  The patient was hemoccult test positive in the ED with black tarry stool seen.  Pt is being admitted for GI bleed and for GI consultation.  Pt had negative orthostatic vitals.    Review of Systems: All systems reviewed and apart from history of presenting illness, are negative.  Past Medical History:  Diagnosis Date  . Anemia   . Anxiety   . Atrial fibrillation (HCC)   . DJD (degenerative joint disease)   . Gastritis   . GI bleed   . Hypertension   . Osteoporosis   . Seasonal allergies   . Sleep apnea    Past Surgical History:  Procedure Laterality Date  . CHOLECYSTECTOMY    . ESOPHAGOGASTRODUODENOSCOPY N/A 05/16/2015   Procedure: ESOPHAGOGASTRODUODENOSCOPY (EGD);  Surgeon: West Bali, MD;  Location: [facility];  Service: Endoscopy;  Laterality: N/A;  . HEMORRHOID SURGERY    . PACEMAKER INSERTION    . PPM GENERATOR CHANGEOUT N/A 08/02/2016   Procedure: PPM Generator Changeout;  Surgeon: Marinus Maw, MD;  Location: [facility] INVASIVE CV LAB;  Service: Cardiovascular;  Laterality: N/A;  . TOTAL HIP ARTHROPLASTY     Social History:  reports that she has never  smoked. She has never used smokeless tobacco. She reports that she does not drink alcohol or use drugs.  No Known Allergies  Family History  Problem Relation Age of Onset  . Colon cancer Father   . Colon cancer Paternal Aunt   . Crohn's disease Son     Prior to Admission medications   Medication Sig Start Date End Date Taking? Authorizing Provider  acetaminophen (TYLENOL) 500 MG tablet Take 500 mg by mouth at bedtime.   Yes [provider]  amLODipine (NORVASC) 5 MG tablet Take 1 tablet (5 mg total) by mouth daily. 05/25/14  Yes Laqueta Linden, MD  ammonium lactate (LAC-HYDRIN) 12 % lotion Apply 1 application topically daily as needed. 11/24/17  Yes [provider]  aspirin EC 81 MG tablet Take 1 tablet (81 mg total) by mouth daily. 04/26/16  Yes Laqueta Linden, MD  ferrous sulfate 325 (65 FE) MG EC tablet Take 325 mg by mouth daily.    Yes [provider]  fluticasone (FLONASE) 50 MCG/ACT nasal spray Place 2 sprays into both nostrils daily.   Yes [provider]  furosemide (LASIX) 40 MG tablet Take 1 tablet (40 mg total) by mouth 2 (two) times daily. 07/25/17  Yes Laqueta Linden, MD  lisinopril (PRINIVIL,ZESTRIL) 40 MG tablet Take 1 tablet (40 mg total) by mouth daily. 04/08/12  Yes Marinus Maw, MD  loratadine (CLARITIN) 10 MG tablet Take 1 tablet by mouth  daily 04/09/16  Yes [provider]  pantoprazole (PROTONIX) 40 MG tablet Take 40 mg by mouth daily.   Yes [provider]  potassium chloride SA (K-DUR,KLOR-CON) 20 MEQ tablet Take 1 tablet (20 mEq total) by mouth 2 (two) times daily. 07/25/17  Yes Laqueta Linden, MD   Physical Exam: Vitals:   12/03/17 1000 12/03/17 1030 12/03/17 1041 12/03/17 1100  BP: 134/61 (!) 125/54 (!) 125/54 (!) 136/57  Pulse: 64 64 70   Resp: 17 15 16 16   Temp:      TempSrc:      SpO2: 100% 100% 98%   Weight:      Height:         General exam: elderly female, NAD, cooperative.   Moderately built and nourished patient, lying comfortably supine on the gurney in no obvious distress.  Head, eyes and ENT: Nontraumatic and normocephalic. Pupils equally reacting to light and accommodation. Oral mucosa moist.  Neck: Supple. No JVD, carotid bruit or thyromegaly.  Lymphatics: No lymphadenopathy.  Respiratory system: Clear to auscultation. No increased work of breathing.  Cardiovascular system: S1 and S2 heard, RRR. No JVD, murmurs, gallops, clicks or pedal edema.  Gastrointestinal system: Abdomen is nondistended, soft and nontender. Normal bowel sounds heard. No organomegaly or masses appreciated.  Central nervous system: Alert and oriented. No focal neurological deficits.  Extremities: Symmetric 5 x 5 power. Peripheral pulses symmetrically felt.   Skin: No rashes or acute findings.  Musculoskeletal system: Negative exam.  Psychiatry: Pleasant and cooperative.  Labs on Admission:  Basic Metabolic Panel: Recent Labs  Lab 11/28/17 1707 12/03/17 0940  NA 140 142  K 3.4* 3.7  CL 104 107  CO2 26 26  GLUCOSE 190* 124*  BUN 23 38*  CREATININE 1.07* 1.17*  CALCIUM 9.2 9.2  MG 2.1  --    Liver Function Tests: Recent Labs  Lab 11/28/17 1707 12/03/17 0940  AST 24 22  ALT 13 12  ALKPHOS 75 64  BILITOT 0.8 0.6  PROT 7.9 7.2  ALBUMIN 4.0 3.7   No results for input(s): LIPASE, AMYLASE in the last 168 hours. No results for input(s): AMMONIA in the last 168 hours. CBC: Recent Labs  Lab 11/28/17 1707 12/03/17 0940  WBC 9.6 6.7  NEUTROABS 8.3* 5.0  HGB 10.3* 8.2*  HCT 34.0* 26.8*  MCV 101.8* 100.4*  PLT 187 185   Cardiac Enzymes: Recent Labs  Lab 11/28/17 1707 12/03/17 0940  TROPONINI 0.03* 0.03*    BNP (last 3 results) No results for input(s): PROBNP in the last 8760 hours. CBG: No results for input(s): GLUCAP in the last 168 hours.  Radiological Exams on Admission: Dg Chest Port 1 View  Result Date: 12/03/2017 CLINICAL DATA:  Weakness  EXAM: PORTABLE CHEST 1 VIEW COMPARISON:  11/28/2017 FINDINGS: Left pacer remains in place, unchanged. Cardiomegaly. Large hiatal hernia. Minimal bibasilar atelectasis. No overt edema. Possible small effusions. No acute bony abnormality. IMPRESSION: Cardiomegaly with bibasilar atelectasis and probable small effusions. Large hiatal hernia. Electronically Signed   By: Charlett Nose M.D.   On: 12/03/2017 09:38   EKG: Independently reviewed. Ventricular paced.   Assessment/Plan Principal Problem:   Generalized weakness Active Problems:   Essential hypertension, benign   Bradycardia   Cardiac pacemaker in situ   CAD (coronary artery disease)   CHF (congestive heart failure) (HCC)   Severe tricuspid regurgitation   Atrial fibrillation (HCC)   Melena   Acute GI bleeding   Dysphagia   GERD (gastroesophageal reflux  disease)   Heme positive stool   Acute blood loss anemia  1. Acute blood loss anemia - Pt had an acute drop in Hg in a couple of days from 10 down to 8.  She is hemoccult positive.  Aspirin has been discontinued.  Pt has been started on IV protonix.  GI consultation has been requested.  NPO until evaluated by GI team.  2. Extertional dyspnea - likely exacerbated by GI bleed but patient has chronic dyspnea and fatigue likely related to her significant valvular heart disease. 3. Gait instability - When medically stabilized will ask for PT evaluation.  4. Moderate to severe Aortic stenosis - Pt is being treated medically by cardiology with diuresis. She takes lasix 40 mg twice daily with potassium supplementation.   5. AKI - likely prerenal - She was treated with IV fluid hydration in ED.  Recheck BMP in AM.   6. CAD - no chest pain symptoms, holding aspirin now due to GI bleeding.   7. Symptomatic bradycardia - Pt is s/p pacemaker placement.   8. Essential hypertension - well controlled.  Following.   9. Paroxysmal Atrial Fibrillation - Pt is not a candidate for anticoagulation per  cardiology team.    DVT Prophylaxis: SCDs Code Status: Full   Family Communication: at bedside   Disposition Plan: Home when medically stabilized   Time spent: 59 minutes.   Standley Dakins, MD [facility] Pager [phone]  If 7PM-7AM, please contact night-coverage [email or web address] Password [email or web address] 12/03/2017, 11:35 AM

## 2017-12-03 NOTE — ED Notes (Signed)
Date and time results received: 12/03/17 10:23 AM   Test: trop Critical Value: 0.03  Name of Provider Notified: Dr. Clarene Duke   Orders Received? Or Actions Taken?:

## 2017-12-03 NOTE — Consult Note (Signed)
Referring Provider: Standley Dakins, MD Primary Care Physician:  Avon Gully, MD Primary Gastroenterologist:  Dr. Jena Gauss.  Reason for Consultation:    Melena and anemia.  HPI:   Patient is 84 year old Afro-American female who has a history of upper GI bleed(April 2017 when she was on Eliquis) who has a history of anemia and was in usual state of health until 1 week ago when she started to feel weak.  Her weakness has been progressive ever since.  According to her daughter [name] who is at bedside she reached the point that she could not even stand up.  She also noted that her stool was very foul-smelling and realized that she must of been losing blood from her GI tract.  Her stools always have been black because she is on iron.  Patient was therefore brought to emergency room.  Her hemoglobin was 8.2.  Her hemoglobin 5 days ago was 10.3 when she was seen in the emergency room for swallowing issues.  She has a history of esophageal dysmotility and hiatal hernia. Patient was hospitalized and pantoprazole switched to IV route. Patient has dysphagia.  Her daughter states she generally stays on soft food and rarely eats meat.  She denies heartburn nausea or vomiting.  She also denies abdominal pain.  She states she has very good appetite.  She has not lost any weight recently.  She is on low-dose aspirin but does not take other OTC NSAIDs. She is widowed.  She lives alone but is visited by 1 of her family members her children every day.  She almost always have some help at home.  She ambulates with the help of walker.  She states busy.  She laughs to do puzzles and an avid newspaper reader.  She has 7 children.  Past Medical History:  Diagnosis Date  . Anemia   . Anxiety   . Atrial fibrillation (HCC)   . DJD (degenerative joint disease)   . Gastritis   . GI bleed   . Hypertension   . Osteoporosis   . Seasonal allergies   . Sleep apnea     Past Surgical History:  Procedure Laterality Date   . CHOLECYSTECTOMY    . ESOPHAGOGASTRODUODENOSCOPY N/A 05/16/2015   Procedure: ESOPHAGOGASTRODUODENOSCOPY (EGD);  Surgeon: West Bali, MD;  Location: [facility];  Service: Endoscopy;  Laterality: N/A;  . HEMORRHOID SURGERY    . PACEMAKER INSERTION    . PPM GENERATOR CHANGEOUT N/A 08/02/2016   Procedure: PPM Generator Changeout;  Surgeon: Marinus Maw, MD;  Location: [facility] INVASIVE CV LAB;  Service: Cardiovascular;  Laterality: N/A;  . TOTAL HIP ARTHROPLASTY      Prior to Admission medications   Medication Sig Start Date End Date Taking? Authorizing Provider  acetaminophen (TYLENOL) 500 MG tablet Take 500 mg by mouth at bedtime.   Yes [provider]  amLODipine (NORVASC) 5 MG tablet Take 1 tablet (5 mg total) by mouth daily. 05/25/14  Yes Laqueta Linden, MD  ammonium lactate (LAC-HYDRIN) 12 % lotion Apply 1 application topically daily as needed. 11/24/17  Yes [provider]  aspirin EC 81 MG tablet Take 1 tablet (81 mg total) by mouth daily. 04/26/16  Yes Laqueta Linden, MD  ferrous sulfate 325 (65 FE) MG EC tablet Take 325 mg by mouth daily.    Yes [provider]  fluticasone (FLONASE) 50 MCG/ACT nasal spray Place 2 sprays into both nostrils daily.   Yes [provider]  furosemide (LASIX) 40  MG tablet Take 1 tablet (40 mg total) by mouth 2 (two) times daily. 07/25/17  Yes Laqueta Linden, MD  lisinopril (PRINIVIL,ZESTRIL) 40 MG tablet Take 1 tablet (40 mg total) by mouth daily. 04/08/12  Yes Marinus Maw, MD  loratadine (CLARITIN) 10 MG tablet Take 1 tablet by mouth daily 04/09/16  Yes [provider]  pantoprazole (PROTONIX) 40 MG tablet Take 40 mg by mouth daily.   Yes [provider]  potassium chloride SA (K-DUR,KLOR-CON) 20 MEQ tablet Take 1 tablet (20 mEq total) by mouth 2 (two) times daily. 07/25/17  Yes Laqueta Linden, MD    Current Facility-Administered Medications  Medication Dose Route Frequency  Provider Last Rate Last Dose  . 0.9 %  sodium chloride infusion  250 mL Intravenous PRN Johnson, Clanford L, MD      . acetaminophen (TYLENOL) tablet 650 mg  650 mg Oral Q6H PRN Johnson, Clanford L, MD       Or  . acetaminophen (TYLENOL) suppository 650 mg  650 mg Rectal Q6H PRN Johnson, Clanford L, MD      . amLODipine (NORVASC) tablet 5 mg  5 mg Oral Daily Johnson, Clanford L, MD   5 mg at 12/03/17 1527  . [START ON 12/04/2017] ferrous sulfate EC tablet 325 mg  325 mg Oral Q breakfast Johnson, Clanford L, MD      . Melene Muller ON 12/04/2017] fluticasone (FLONASE) 50 MCG/ACT nasal spray 2 spray  2 spray Each Nare Daily Johnson, Clanford L, MD      . furosemide (LASIX) tablet 40 mg  40 mg Oral BID Johnson, Clanford L, MD   40 mg at 12/03/17 1739  . [START ON 12/04/2017] loratadine (CLARITIN) tablet 10 mg  10 mg Oral Daily Johnson, Clanford L, MD      . ondansetron (ZOFRAN) tablet 4 mg  4 mg Oral Q6H PRN Johnson, Clanford L, MD       Or  . ondansetron (ZOFRAN) injection 4 mg  4 mg Intravenous Q6H PRN Johnson, Clanford L, MD      . Melene Muller ON 12/04/2017] pantoprazole (PROTONIX) injection 40 mg  40 mg Intravenous Q24H Johnson, Clanford L, MD      . sodium chloride flush (NS) 0.9 % injection 3 mL  3 mL Intravenous Q12H Johnson, Clanford L, MD   3 mL at 12/03/17 2108  . sodium chloride flush (NS) 0.9 % injection 3 mL  3 mL Intravenous PRN Johnson, Clanford L, MD      . traZODone (DESYREL) tablet 25 mg  25 mg Oral QHS PRN Cleora Fleet, MD        Allergies as of 12/03/2017  . (No Known Allergies)    Family History  Problem Relation Age of Onset  . Colon cancer Father   . Colon cancer Paternal Aunt   . Crohn's disease Son     Social History   Socioeconomic History  . Marital status: Widowed    Spouse name: Not on file  . Number of children: Not on file  . Years of education: Not on file  . Highest education level: Not on file  Occupational History  . Not on file  Social Needs  .  Financial resource strain: Not on file  . Food insecurity:    Worry: Not on file    Inability: Not on file  . Transportation needs:    Medical: Not on file    Non-medical: Not on file  Tobacco Use  . Smoking  status: Never Smoker  . Smokeless tobacco: Never Used  Substance and Sexual Activity  . Alcohol use: No    Alcohol/week: 0.0 standard drinks  . Drug use: No  . Sexual activity: Not on file  Lifestyle  . Physical activity:    Days per week: Not on file    Minutes per session: Not on file  . Stress: Not on file  Relationships  . Social connections:    Talks on phone: Not on file    Gets together: Not on file    Attends religious service: Not on file    Active member of club or organization: Not on file    Attends meetings of clubs or organizations: Not on file    Relationship status: Not on file  . Intimate partner violence:    Fear of current or ex partner: Not on file    Emotionally abused: Not on file    Physically abused: Not on file    Forced sexual activity: Not on file  Other Topics Concern  . Not on file  Social History Narrative   Widowed   Daughter    Review of Systems: See HPI, otherwise normal ROS  Physical Exam: Temp:  [98 F (36.7 C)-98.2 F (36.8 C)] 98.2 F (36.8 C) (11/13 1651) Pulse Rate:  [58-129] 58 (11/13 1651) Resp:  [13-22] 18 (11/13 1651) BP: (109-141)/(40-71) 141/71 (11/13 1651) SpO2:  [89 %-100 %] 93 % (11/13 2006) Weight:  [66.6 kg] 66.6 kg (11/13 0905) Last BM Date: 12/03/17  Patient is alert and in no acute distress. Conjunctiva is pale.  Sclerae nonicteric. Oral pharyngeal mucosa is normal. No thyromegaly or lymphadenopathy noted. She has pacemaker in left pectoral region. Cardiac exam with regular rhythm normal S1 and S2.  She has a grade 3/6 holosystolic murmur best heard inside the apex. Auscultation of lungs reveal vesicular breath sounds bilaterally. Abdomen is symmetrical.  Bowel sounds are normal.  On palpation  abdomen is soft and nontender with organomegaly or masses. No peripheral edema or clubbing noted.   Lab Results: Recent Labs    12/03/17 0940 12/03/17 1811  WBC 6.7  --   HGB 8.2* 7.9*  HCT 26.8* 26.0*  PLT 185  --    BMET Recent Labs    12/03/17 0940  NA 142  K 3.7  CL 107  CO2 26  GLUCOSE 124*  BUN 38*  CREATININE 1.17*  CALCIUM 9.2   LFT Recent Labs    12/03/17 0940  PROT 7.2  ALBUMIN 3.7  AST 22  ALT 12  ALKPHOS 64  BILITOT 0.6   PT/INR Recent Labs    12/03/17 0940  LABPROT 14.3  INR 1.12   Serum iron 27 TIBC 392 and saturation 7%. Serum ferritin 37 Folate level 20.5 Vitamin B12 323.  Studies/Results: Dg Chest Port 1 View  Result Date: 12/03/2017 CLINICAL DATA:  Weakness EXAM: PORTABLE CHEST 1 VIEW COMPARISON:  11/28/2017 FINDINGS: Left pacer remains in place, unchanged. Cardiomegaly. Large hiatal hernia. Minimal bibasilar atelectasis. No overt edema. Possible small effusions. No acute bony abnormality. IMPRESSION: Cardiomegaly with bibasilar atelectasis and probable small effusions. Large hiatal hernia. Electronically Signed   By: Charlett Nose M.D.   On: 12/03/2017 09:38    Assessment;  Patient is 84 year old Afro-American female with known hiatal hernia as well as history of hemorrhagic gastritis in the setting of anticoagulant about 2-1/2 years ago who now presents with progressive weakness and noted to have 2 g drop in her hemoglobin from  a baseline of 10.3 g 5 days ago.  She has had melena in her stool is guaiac positive.  Patient is on low-dose aspirin.  Iron studies suggest iron deficiency anemia. Need to rule out bleeding source in upper GI tract before further evaluation considered.  No history of prior colonoscopy in at least 15 years.  She she has been on chronic PPI therapy and now on pantoprazole 40 mg IV every 12 hours. She has been typed and screened.   Recommendations;  Change pantoprazole to 40 mg IV every 12 hours. H&H will be  repeated this evening. Diagnostic esophagogastroduodenoscopy to be scheduled on 12/04/2017.  Procedure will be performed by Dr. Jena Gauss.   LOS: 0 days   Najeeb Rehman  12/03/2017, 9:12 PM

## 2017-12-03 NOTE — Telephone Encounter (Signed)
Returned pt's daughter's call. She states that her mother is very weak, and has no color to her at all. They are actually on the way to [facility]. I advised her that I would inform cardiologist. Will send to Dr. Purvis Sheffield as an Lorain Childes.

## 2017-12-04 ENCOUNTER — Encounter (HOSPITAL_COMMUNITY)
Admission: EM | Disposition: A | Discharge status: Home / Self Care | Payer: Self-pay | Source: Home / Self Care | Attending: Family Medicine

## 2017-12-04 ENCOUNTER — Encounter (HOSPITAL_COMMUNITY): Payer: Self-pay | Admitting: *Deleted

## 2017-12-04 ENCOUNTER — Inpatient Hospital Stay (HOSPITAL_COMMUNITY): Payer: Medicare Other | Admitting: Anesthesiology

## 2017-12-04 DIAGNOSIS — K449 Diaphragmatic hernia without obstruction or gangrene: Secondary | ICD-10-CM

## 2017-12-04 DIAGNOSIS — Z950 Presence of cardiac pacemaker: Secondary | ICD-10-CM

## 2017-12-04 DIAGNOSIS — K3189 Other diseases of stomach and duodenum: Secondary | ICD-10-CM

## 2017-12-04 DIAGNOSIS — K317 Polyp of stomach and duodenum: Secondary | ICD-10-CM

## 2017-12-04 HISTORY — PX: ESOPHAGOGASTRODUODENOSCOPY (EGD) WITH PROPOFOL: SHX5813

## 2017-12-04 LAB — POCT I-STAT, CHEM 8
BUN: 28 mg/dL — ABNORMAL HIGH (ref 8–23)
Calcium, Ion: 1.1 mmol/L — ABNORMAL LOW (ref 1.15–1.40)
Chloride: 107 mmol/L (ref 98–111)
Creatinine, Ser: 1.1 mg/dL — ABNORMAL HIGH (ref 0.44–1.00)
Glucose, Bld: 101 mg/dL — ABNORMAL HIGH (ref 70–99)
HEMATOCRIT: 34 % — AB (ref 36.0–46.0)
Hemoglobin: 11.6 g/dL — ABNORMAL LOW (ref 12.0–15.0)
Potassium: 3.4 mmol/L — ABNORMAL LOW (ref 3.5–5.1)
Sodium: 143 mmol/L (ref 135–145)
TCO2: 26 mmol/L (ref 22–32)

## 2017-12-04 LAB — BASIC METABOLIC PANEL
Anion gap: 7 (ref 5–15)
BUN: 32 mg/dL — ABNORMAL HIGH (ref 8–23)
CHLORIDE: 109 mmol/L (ref 98–111)
CO2: 26 mmol/L (ref 22–32)
Calcium: 8.1 mg/dL — ABNORMAL LOW (ref 8.9–10.3)
Creatinine, Ser: 1 mg/dL (ref 0.44–1.00)
GFR calc non Af Amer: 46 mL/min — ABNORMAL LOW (ref >60)
GFR, EST AFRICAN AMERICAN: 54 mL/min — AB (ref >60)
Glucose, Bld: 91 mg/dL (ref 70–99)
Potassium: 3.2 mmol/L — ABNORMAL LOW (ref 3.5–5.1)
Sodium: 142 mmol/L (ref 135–145)

## 2017-12-04 LAB — CBC
HEMATOCRIT: 21.8 % — AB (ref 36.0–46.0)
HEMOGLOBIN: 6.8 g/dL — AB (ref 12.0–15.0)
MCH: 31.2 pg (ref 26.0–34.0)
MCHC: 31.2 g/dL (ref 30.0–36.0)
MCV: 100 fL (ref 80.0–100.0)
Platelets: 159 10*3/uL (ref 150–400)
RBC: 2.18 MIL/uL — ABNORMAL LOW (ref 3.87–5.11)
RDW: 13.9 % (ref 11.5–15.5)
WBC: 5 10*3/uL (ref 4.0–10.5)
nRBC: 0 % (ref 0.0–0.2)

## 2017-12-04 LAB — URINE CULTURE: CULTURE: NO GROWTH

## 2017-12-04 LAB — PREPARE RBC (CROSSMATCH)

## 2017-12-04 LAB — HEMOGLOBIN AND HEMATOCRIT, BLOOD
HCT: 33.5 % — ABNORMAL LOW (ref 36.0–46.0)
Hemoglobin: 10.6 g/dL — ABNORMAL LOW (ref 12.0–15.0)

## 2017-12-04 LAB — MAGNESIUM: MAGNESIUM: 2.1 mg/dL (ref 1.7–2.4)

## 2017-12-04 SURGERY — ESOPHAGOGASTRODUODENOSCOPY (EGD) WITH PROPOFOL
Anesthesia: Monitor Anesthesia Care

## 2017-12-04 MED ORDER — MEPERIDINE HCL 100 MG/ML IJ SOLN: 6.2500 mg | INTRAMUSCULAR | Status: DC | PRN

## 2017-12-04 MED ORDER — SODIUM CHLORIDE 0.9 % IV SOLN
INTRAVENOUS | Status: DC
  Administered 2017-12-04: 16:00:00 via INTRAVENOUS

## 2017-12-04 MED ORDER — HYDROCODONE-ACETAMINOPHEN 7.5-325 MG PO TABS: 1.0000 | ORAL_TABLET | Freq: Once | ORAL | Status: DC | PRN

## 2017-12-04 MED ORDER — PROPOFOL 500 MG/50ML IV EMUL
INTRAVENOUS | Status: DC | PRN
  Administered 2017-12-04: 50 ug/kg/min via INTRAVENOUS

## 2017-12-04 MED ORDER — HYDROMORPHONE HCL 1 MG/ML IJ SOLN: 0.2500 mg | INTRAMUSCULAR | Status: DC | PRN

## 2017-12-04 MED ORDER — LACTATED RINGERS IV SOLN
INTRAVENOUS | Status: DC
  Administered 2017-12-04: 16:00:00 via INTRAVENOUS

## 2017-12-04 MED ORDER — SODIUM CHLORIDE 0.9% IV SOLUTION
Freq: Once | INTRAVENOUS | Status: AC
  Administered 2017-12-04: 08:00:00 via INTRAVENOUS

## 2017-12-04 MED ORDER — PHENYLEPHRINE HCL 10 MG/ML IJ SOLN
INTRAMUSCULAR | Status: DC | PRN
  Administered 2017-12-04: 40 ug via INTRAVENOUS
  Administered 2017-12-04 (×2): 20 ug via INTRAVENOUS

## 2017-12-04 MED ORDER — PROMETHAZINE HCL 25 MG/ML IJ SOLN: 6.2500 mg | INTRAMUSCULAR | Status: DC | PRN

## 2017-12-04 MED ORDER — POTASSIUM CHLORIDE 10 MEQ/100ML IV SOLN
10.0000 meq | INTRAVENOUS | Status: AC
  Administered 2017-12-04 (×3): 10 meq via INTRAVENOUS
  Filled 2017-12-04 (×3): qty 100

## 2017-12-04 MED ORDER — PROPOFOL 10 MG/ML IV BOLUS
INTRAVENOUS | Status: DC | PRN
  Administered 2017-12-04 (×2): 10 mg via INTRAVENOUS
  Administered 2017-12-04: 20 mg via INTRAVENOUS

## 2017-12-04 NOTE — Progress Notes (Signed)
CRITICAL VALUE ALERT  Critical Value:  HGB 6.8  Date & Time Notied:  12/04/17 0704  Provider Notified: Dr. Laural Benes  Orders Received/Actions taken: No orders at this time

## 2017-12-04 NOTE — Plan of Care (Signed)
  Problem: Acute Rehab PT Goals(only PT should resolve) Goal: Pt Will Go Supine/Side To Sit Outcome: Progressing Flowsheets (Taken 12/04/2017 1205) Pt will go Supine/Side to Sit: with supervision Goal: Patient Will Transfer Sit To/From Stand Outcome: Progressing Flowsheets (Taken 12/04/2017 1205) Patient will transfer sit to/from stand: with min guard assist Goal: Pt Will Transfer Bed To Chair/Chair To Bed Outcome: Progressing Flowsheets (Taken 12/04/2017 1205) Pt will Transfer Bed to Chair/Chair to Bed: min guard assist Goal: Pt Will Ambulate Outcome: Progressing Flowsheets (Taken 12/04/2017 1205) Pt will Ambulate: 25 feet; with minimal assist; with rolling walker   12:05 PM, 12/04/17 Ocie Bob, MPT Physical Therapist with [facility] [phone] office [phone] mobile phone

## 2017-12-04 NOTE — Op Note (Signed)
[facility] Patient Name: [name] Procedure Date: 12/04/2017 3:50 PM MRN: [id number] Date of Birth: 09/18/1920 Attending MD: Gennette Pac , MD CSN: [id number] Age: 84 Admit Type: Outpatient Procedure:                Upper GI endoscopy Indications:              Coffee-ground emesis Providers:                Gennette Pac, MD, Judee Clara, RN, Dyann Ruddle Referring MD:              Medicines:                Propofol per Anesthesia Complications:            No immediate complications. Estimated Blood Loss:     Estimated blood loss was minimal. Procedure:                Pre-Anesthesia Assessment:                           - Prior to the procedure, a History and Physical                            was performed, and patient medications and                            allergies were reviewed. The patient's tolerance of                            previous anesthesia was also reviewed. The risks                            and benefits of the procedure and the sedation                            options and risks were discussed with the patient.                            All questions were answered, and informed consent                            was obtained. Prior Anticoagulants: The patient has                            taken no previous anticoagulant or antiplatelet                            agents. ASA Grade Assessment: 4 - A patient with                            severe systemic disease. After reviewing the risks  and benefits, the patient was deemed in                            satisfactory condition to undergo the procedure.                           After obtaining informed consent, the endoscope was                            passed under direct vision. Throughout the                            procedure, the patient's blood pressure, pulse, and                            oxygen saturations were  monitored continuously. The                            [id number]-[id number] ([id number]) scope was introduced through the                            and advanced to the second part of duodenum. The                            upper GI endoscopy was accomplished without                            difficulty. Scope In: 4:18:13 PM Scope Out: 4:27:45 PM Total Procedure Duration: 0 hours 9 minutes 32 seconds  Findings:      The examined esophagus was normal.      Huge Hiatal Hernia with most of the stomach above the diaphragm.       Excoriations/erosions straddling the diaphragmatic hiatus consistent       with Sheria Lang lesions. (2) 6mm pedunculated polyps - not manipulated.      Multiple erosions were found in the gastric fundus. No ulcer       infiltrating process.      The duodenal bulb and second portion of the duodenum were normal. Impression:               - Normal esophagus.                           - Large hiatal hernia. Cameron lesions - likely                            source of coffee-ground emesis. Findings may not                            explain all of her anemia.                           - Erosive gastropathy. Gastric polyps.                           - Normal duodenal bulb and second portion of  the                            duodenum.                           - No specimens collected. Moderate Sedation:      Moderate (conscious) sedation was personally administered by an       anesthesia professional. The following parameters were monitored: oxygen       saturation, heart rate, blood pressure, respiratory rate, EKG, adequacy       of pulmonary ventilation, and response to care. Recommendation:           - Return patient to hospital ward for ongoing care.                            Continue once daily PPI. Consider stopping all                            antiplatelet therapy as the benefits may not be                            worth the risks. Dysphagia 2 diet. Do not feel                             patient is fit for further evaluation i.e.                            colonoscopy, etc. Follow H&H. Procedure Code(s):        --- Professional ---                           [phone], 51, Esophagogastroduodenoscopy, flexible,                            transoral; diagnostic, including collection of                            specimen(s) by brushing or washing, when performed                            (separate procedure) Diagnosis Code(s):        --- Professional ---                           K44.9, Diaphragmatic hernia without obstruction or                            gangrene                           K31.89, Other diseases of stomach and duodenum                           K92.0, Hematemesis CPT copyright 2018 [employer] Association. All rights reserved. The codes documented in this report are preliminary and upon coder review may  be revised to meet current compliance requirements. [name]. Amorita Vanrossum, MD Gennette Pac, MD 12/04/2017 4:38:18 PM This report has been signed electronically. Number of Addenda: 0

## 2017-12-04 NOTE — Progress Notes (Signed)
Seen in short stay.  Discussed with children.  She does have issues with dysphagia.  No obstruction on barium esophagram earlier this year.  Has a known large hiatal hernia.  No paraesophageal component seen on barium study earlier this year.    Hemoglobin 2:11 unit transfusion.  Potassium 3.4 with supplementation.  Agree with need for EGD.  Discussed with anesthesia.  Discussed with patient and children at length.The risks, benefits, limitations, alternatives and imponderables have been reviewed with the patient. Potential for esophageal dilation, biopsy, etc. have also been reviewed.  Questions have been answered. All parties agreeable.  Further recommendation to follow.

## 2017-12-04 NOTE — Care Management Note (Signed)
Case Management Note  Patient Details  Name: [name] MRN: [id number] Date of Birth: 08/07/1920  Subjective/Objective:     Generalized weakness. From home with family and private duty sitters. Walks with RW. Has PCP and transportation. No issues affording medications. Family at bedside. Discussed home health PT as recommended. Patient declines. Family aware that PCP can order Home health after DC .               Action/Plan: DC home with family and caregivers.  Expected Discharge Date:   12/05/2017               Expected Discharge Plan:  Home/Self Care  In-House Referral:     Discharge planning Services  CM Consult  Post Acute Care Choice:  Home Health Choice offered to:  Patient, [facility] POA / Guardian  DME Arranged:    DME Agency:     HH Arranged:  Patient Refused HH HH Agency:     Status of Service:  Completed, signed off  If discussed at [employer] of Stay Meetings, dates discussed:    Additional Comments:  [name], Ashley Oiler, RN 12/04/2017, 2:02 PM

## 2017-12-04 NOTE — Progress Notes (Signed)
Upon arrival pt. Asked for Kleeex to spit in.  Pt. Then stated that her tooth felt "chipped". I inspected the tissue & two teeth were in the tissue.  Dr. Daylene Katayama & Dr. Jena Gauss both notified & into assess pt.  Pt. Stated that those teeth were "fake" & have been in for years. Teeth were retrievied & placed in clear bag in a denture cup.

## 2017-12-04 NOTE — Anesthesia Postprocedure Evaluation (Signed)
Anesthesia Post Note  Patient: [name]  Procedure(s) Performed: ESOPHAGOGASTRODUODENOSCOPY (EGD) WITH PROPOFOL (N/A )  Patient location during evaluation: PACU Anesthesia Type: MAC Level of consciousness: awake and alert and patient cooperative Pain management: satisfactory to patient Vital Signs Assessment: post-procedure vital signs reviewed and stable Respiratory status: spontaneous breathing and patient connected to nasal cannula oxygen Cardiovascular status: stable Postop Assessment: no apparent nausea or vomiting Anesthetic complications: no Comments: Patient opened mouth and 2 veneer like teeth removed. Dr. Gala Romney called and Cameranzi aware.. Dental guard and scope manipulation discussed with patient.     Last Vitals:  Vitals:   12/04/17 1644 12/04/17 1645  BP: (!) 123/54 (!) 123/54  Pulse: 60 60  Resp: 16 20  Temp: 36.9 C   SpO2: 98% 98%    Last Pain:  Vitals:   12/04/17 1644  TempSrc:   PainSc: 0-No pain                 [name]

## 2017-12-04 NOTE — Anesthesia Procedure Notes (Signed)
Procedure Name: MAC Date/Time: 12/04/2017 4:11 PM Performed by: Vista Deck, CRNA Pre-anesthesia Checklist: Patient identified, Emergency Drugs available, Suction available, Timeout performed and Patient being monitored Patient Re-evaluated:Patient Re-evaluated prior to induction Oxygen Delivery Method: Nasal Cannula

## 2017-12-04 NOTE — Transfer of Care (Signed)
Immediate Anesthesia Transfer of Care Note  Patient: [name]  Procedure(s) Performed: ESOPHAGOGASTRODUODENOSCOPY (EGD) WITH PROPOFOL (N/A )  Patient Location: PACU  Anesthesia Type:MAC  Level of Consciousness: awake, alert  and patient cooperative  Airway & Oxygen Therapy: Patient Spontanous Breathing and Patient connected to nasal cannula oxygen  Post-op Assessment: Report given to RN and Post -op Vital signs reviewed and stable  Post vital signs: Reviewed and stable  Last Vitals:  Vitals Value Taken Time  BP 78/69 12/04/2017  4:42 PM  Temp    Pulse 60 12/04/2017  4:44 PM  Resp 20 12/04/2017  4:44 PM  SpO2 89 % 12/04/2017  4:44 PM  Vitals shown include unvalidated device data.  Last Pain:  Vitals:   12/04/17 1453  TempSrc: Oral  PainSc: 0-No pain      Patients Stated Pain Goal: 5 (23/34/35 6861)  Complications: No apparent anesthesia complications

## 2017-12-04 NOTE — Evaluation (Signed)
Physical Therapy Evaluation Patient Details Name: [name] MRN: [id number] DOB: [birth year] Today's Date: 12/04/2017   History of Present Illness  [name] is a 84 y.o. female with severe significant valvular heart disease and restrictive diastolic/chronic diastolic heart failure, atrial fibrillation, symptomatic bradycardia with pacemaker, CAD, advanced age presented to ED with family complaining of 1 week of progressive fatigue and black tarry stools.  Pt does take iron supplements and has black stools regularly but family reports that her stools have recently become more dark.  The patient had been seen in ED a few days ago with chest discomfort and fatigue and sent home.  Pt had a hg of 10 on 11/28/17 and it has dropped down to 8 today.  The patient's fatigue has only continued to get worse.  The patient was hemoccult test positive in the ED with black tarry stool seen.  Pt is being admitted for GI bleed and for GI consultation.  Pt had negative orthostatic vitals.      Clinical Impression  Patient demonstrates slow labored movement for sitting up at bedside, limited to a few steps at bedside due to BLE weakness and fatigue, put back to bed after therapy due to in the process of receiving blood transfusion.  Patient will benefit from continued physical therapy in hospital and recommended venue below to increase strength, balance, endurance for safe ADLs and gait.    Follow Up Recommendations Home health PT;Supervision/Assistance - 24 hour;Supervision for mobility/OOB    Equipment Recommendations  None recommended by PT    Recommendations for Other Services       Precautions / Restrictions Precautions Precautions: Fall Restrictions Weight Bearing Restrictions: No      Mobility  Bed Mobility Overal bed mobility: Needs Assistance Bed Mobility: Supine to Sit;Sit to Supine     Supine to sit: Min assist Sit to supine: Min assist   General bed mobility comments: slow labored  movement  Transfers Overall transfer level: Needs assistance Equipment used: Rolling walker (2 wheeled) Transfers: Sit to/from Stand Sit to Stand: Min assist         General transfer comment: labored movement  Ambulation/Gait Ambulation/Gait assistance: Mod assist Gait Distance (Feet): 3 Feet Assistive device: Rolling walker (2 wheeled) Gait Pattern/deviations: Decreased step length - right;Decreased step length - left;Decreased stride length Gait velocity: slow   General Gait Details: limited to 5-6 steps at bedside demonstrating slow labored movement with much leaning over RW  Stairs            Wheelchair Mobility    Modified Rankin (Stroke Patients Only)       Balance Overall balance assessment: Needs assistance Sitting-balance support: Feet supported;No upper extremity supported Sitting balance-Leahy Scale: Fair     Standing balance support: During functional activity;Bilateral upper extremity supported Standing balance-Leahy Scale: Poor Standing balance comment: fair/poor with RW                             Pertinent Vitals/Pain Pain Assessment: No/denies pain    Home Living Family/patient expects to be discharged to:: Private residence Living Arrangements: Alone;Other (Comment)(has family and/or person care attendants 24/7) Available Help at Discharge: Family;Personal care attendant Type of Home: House Home Access: Stairs to enter   Entrance Stairs-Number of Steps: 2 Home Layout: One level Home Equipment: Walker - 2 wheels;Wheelchair - manual;Shower seat;Bedside commode;Hospital bed      Prior Function Level of Independence: Needs assistance   Gait /  Transfers Assistance Needed: assisted household ambulator leaning on walls/furinture, uses RW for longer distance  ADL's / Homemaking Assistance Needed: has personal care attendants from 9am to 3pm x 7 days/week, family members present rest of time        Hand Dominance         Extremity/Trunk Assessment   Upper Extremity Assessment Upper Extremity Assessment: Generalized weakness    Lower Extremity Assessment Lower Extremity Assessment: Generalized weakness       Communication   Communication: No difficulties  Cognition Arousal/Alertness: Awake/alert Behavior During Therapy: WFL for tasks assessed/performed Overall Cognitive Status: Within Functional Limits for tasks assessed                                        General Comments      Exercises     Assessment/Plan    PT Assessment Patient needs continued PT services  PT Problem List Decreased strength;Decreased activity tolerance;Decreased balance;Decreased mobility       PT Treatment Interventions Gait training;Stair training;Functional mobility training;Therapeutic activities;Therapeutic exercise;Patient/family education    PT Goals (Current goals can be found in the Care Plan section)  Acute Rehab PT Goals Patient Stated Goal: return home with family to assist PT Goal Formulation: With patient/family Time For Goal Achievement: 12/11/17 Potential to Achieve Goals: Good    Frequency Min 3X/week   Barriers to discharge        Co-evaluation               AM-PAC PT "6 Clicks" Daily Activity  Outcome Measure Difficulty turning over in bed (including adjusting bedclothes, sheets and blankets)?: Unable Difficulty moving from lying on back to sitting on the side of the bed? : Unable Difficulty sitting down on and standing up from a chair with arms (e.g., wheelchair, bedside commode, etc,.)?: Unable Help needed moving to and from a bed to chair (including a wheelchair)?: A Lot Help needed walking in hospital room?: A Lot Help needed climbing 3-5 steps with a railing? : Total 6 Click Score: 8    End of Session   Activity Tolerance: Patient limited by fatigue Patient left: in bed;with call bell/phone within reach;with bed alarm set;with family/visitor  present Nurse Communication: Mobility status PT Visit Diagnosis: Unsteadiness on feet (R26.81);Other abnormalities of gait and mobility (R26.89);Muscle weakness (generalized) (M62.81)    Time: 2952-8413 PT Time Calculation (min) (ACUTE ONLY): 32 min   Charges:   PT Evaluation $PT Eval Moderate Complexity: 1 Mod PT Treatments $Therapeutic Activity: 23-37 mins        12:03 PM, 12/04/17 Ocie Bob, MPT Physical Therapist with [facility] 336 [phone] office [phone] mobile phone

## 2017-12-04 NOTE — Anesthesia Preprocedure Evaluation (Signed)
Anesthesia Evaluation    Airway Mallampati: II       Dental  (+) Poor Dentition   Pulmonary sleep apnea , pneumonia,     + decreased breath sounds      Cardiovascular hypertension, On Medications + CAD and +CHF  + Valvular Problems/Murmurs  Rhythm:regular     Neuro/Psych PSYCHIATRIC DISORDERS Anxiety    GI/Hepatic hiatal hernia, GERD  ,  Endo/Other    Renal/GU      Musculoskeletal   Abdominal   Peds  Hematology  (+) Blood dyscrasia, anemia ,   Anesthesia Other Findings Admit with weakness.  Anemia, s/p transfusion 2 units this PM repeat hgb pending D/W GI doc need for study v concerns/risks of TIVA   Reproductive/Obstetrics                             Anesthesia Physical Anesthesia Plan  ASA: IV  Anesthesia Plan: MAC   Post-op Pain Management:    Induction:   PONV Risk Score and Plan:   Airway Management Planned:   Additional Equipment:   Intra-op Plan:   Post-operative Plan:   Informed Consent:   Plan Discussed with: Anesthesiologist  Anesthesia Plan Comments:         Anesthesia Quick Evaluation

## 2017-12-04 NOTE — Progress Notes (Signed)
PROGRESS NOTE    [name]  [id number]:[id number]  DOB: 04/05/1920  DOA: 12/03/2017 PCP: Avon Gully, MD   Brief Admission Hx: 84 y.o. female with severe significant valvular heart disease and restrictive diastolic/chronic diastolic heart failure, atrial fibrillation, symptomatic bradycardia with pacemaker, CAD, advanced age presented to ED with family complaining of 1 week of progressive fatigue and black tarry stools.   MDM/Assessment & Plan:   1. Acute blood loss anemia - Pt had an acute drop in Hg in a couple of days from 10 down to 6.8.  She is hemoccult positive.  Aspirin has been discontinued.  Pt has been started on IV protonix.  GI consultation has been requested.  GI planning EGD later today.  Type and cross and transfuse 2 units PRBC 12/04/17.   2. Extertional dyspnea - likely exacerbated by GI bleed but patient has chronic dyspnea and fatigue likely related to her significant valvular heart disease and anemia.  Transfuse PRBCs as ordered.  3. Gait instability -  PT recommending Home health PT.  4. Moderate to severe Aortic stenosis - Pt is being treated medically by cardiology with diuresis. She takes lasix 40 mg twice daily with potassium supplementation.   5. AKI - likely prerenal - She was treated with IV fluid hydration in ED.  creatinine improved with hydration. Elevated BUN likely from GI bleed.   6. CAD - no chest pain symptoms, holding aspirin now due to GI bleeding.   7. Symptomatic bradycardia - Pt is s/p pacemaker placement.   8. Essential hypertension - well controlled.  Following.   9. Paroxysmal Atrial Fibrillation - Pt is not a candidate for anticoagulation per cardiology team.    DVT Prophylaxis: SCDs Code Status: Full   Family Communication: at bedside   Disposition Plan: Home when medically stabilized   Consultants:  GI   Procedures:  EGD 12/04/17  Antimicrobials:  n/a  Subjective: Pt says she feels very weak.  No chest pain and no SOB.    Objective: Vitals:   12/04/17 0545 12/04/17 0826 12/04/17 0858 12/04/17 0930  BP: (!) 103/51  (!) 120/49 122/69  Pulse: 60  (!) 59 60  Resp: 20  20 20   Temp: 98.4 F (36.9 C)  98.2 F (36.8 C) 98.3 F (36.8 C)  TempSrc: Oral  Oral Oral  SpO2: 100% 100% 100% 100%  Weight:      Height:        Intake/Output Summary (Last 24 hours) at 12/04/2017 1119 Last data filed at 12/04/2017 0930 Gross per 24 hour  Intake 816.3 ml  Output 701 ml  Net 115.3 ml   Filed Weights   12/03/17 0905  Weight: 66.6 kg     REVIEW OF SYSTEMS  As per history otherwise all reviewed and reported negative  Exam:  General exam: elderly female awake, alert, NAD. Cooperative.  Respiratory system: Clear. No increased work of breathing. Cardiovascular system: normal S1 & S2 heard.  Gastrointestinal system: Abdomen is nondistended, soft and nontender. Normal bowel sounds heard. Central nervous system: Alert and oriented. No focal neurological deficits. Extremities: no cyanosis or clubbing.  Data Reviewed: Basic Metabolic Panel: Recent Labs  Lab 11/28/17 1707 12/03/17 0940 12/04/17 0527  NA 140 142 142  K 3.4* 3.7 3.2*  CL 104 107 109  CO2 26 26 26   GLUCOSE 190* 124* 91  BUN 23 38* 32*  CREATININE 1.07* 1.17* 1.00  CALCIUM 9.2 9.2 8.1*  MG 2.1  --  2.1   Liver Function  Tests: Recent Labs  Lab 11/28/17 1707 12/03/17 0940  AST 24 22  ALT 13 12  ALKPHOS 75 64  BILITOT 0.8 0.6  PROT 7.9 7.2  ALBUMIN 4.0 3.7   No results for input(s): LIPASE, AMYLASE in the last 168 hours. No results for input(s): AMMONIA in the last 168 hours. CBC: Recent Labs  Lab 11/28/17 1707 12/03/17 0940 12/03/17 1811 12/04/17 0527  WBC 9.6 6.7  --  5.0  NEUTROABS 8.3* 5.0  --   --   HGB 10.3* 8.2* 7.9* 6.8*  HCT 34.0* 26.8* 26.0* 21.8*  MCV 101.8* 100.4*  --  100.0  PLT 187 185  --  159   Cardiac Enzymes: Recent Labs  Lab 11/28/17 1707 12/03/17 0940  TROPONINI 0.03* 0.03*   CBG (last 3)   No results for input(s): GLUCAP in the last 72 hours. Recent Results (from the past 240 hour(s))  Urine culture     Status: None   Collection Time: 12/03/17  9:20 AM  Result Value Ref Range Status   Specimen Description   Final    URINE, CATHETERIZED Performed at [facility], [address], [place], [place] [address]    Special Requests   Final    NONE Performed at [facility], [address] [place], [place] [address]    Culture   Final    NO GROWTH Performed at [facility], [address] [place], [place] [address]    Report Status 12/04/2017 FINAL  Final     Studies: Dg Chest Port 1 View  Result Date: 12/03/2017 CLINICAL DATA:  Weakness EXAM: PORTABLE CHEST 1 VIEW COMPARISON:  11/28/2017 FINDINGS: Left pacer remains in place, unchanged. Cardiomegaly. Large hiatal hernia. Minimal bibasilar atelectasis. No overt edema. Possible small effusions. No acute bony abnormality. IMPRESSION: Cardiomegaly with bibasilar atelectasis and probable small effusions. Large hiatal hernia. Electronically Signed   By: Charlett Nose M.D.   On: 12/03/2017 09:38     Scheduled Meds: . amLODipine  5 mg Oral Daily  . fluticasone  2 spray Each Nare Daily  . furosemide  40 mg Oral BID  . loratadine  10 mg Oral Daily  . pantoprazole (PROTONIX) IV  40 mg Intravenous Q12H  . sodium chloride flush  3 mL Intravenous Q12H   Continuous Infusions: . sodium chloride 250 mL (12/04/17 0750)  . potassium chloride 10 mEq (12/04/17 1019)    Principal Problem:   Generalized weakness Active Problems:   Essential hypertension, benign   Bradycardia   Cardiac pacemaker in situ   CAD (coronary artery disease)   CHF (congestive heart failure) (HCC)   Severe tricuspid regurgitation   Atrial fibrillation (HCC)   Melena   Acute GI bleeding   Dysphagia   GERD (gastroesophageal reflux disease)   Heme positive stool   Acute blood loss anemia   Time spent:   Standley Dakins, MD, FAAFP [facility] Pager [phone]  If 7PM-7AM, please contact night-coverage [email or web address] Password [email or web address] 12/04/2017, 11:19 AM    LOS: 1 day

## 2017-12-04 NOTE — Progress Notes (Signed)
Spoke to nursing, Reuel Boom. In process of starting second IV. Orders for 2 units of prbcs, KCL runs as well.   He is aware of new order for NPO.   Plans for EGD today.   Leanna Battles. [facility] [phone] 11/14/20197:34 AM

## 2017-12-05 ENCOUNTER — Encounter (HOSPITAL_COMMUNITY): Payer: Self-pay | Admitting: Internal Medicine

## 2017-12-05 DIAGNOSIS — D649 Anemia, unspecified: Secondary | ICD-10-CM

## 2017-12-05 DIAGNOSIS — K259 Gastric ulcer, unspecified as acute or chronic, without hemorrhage or perforation: Secondary | ICD-10-CM

## 2017-12-05 DIAGNOSIS — K253 Acute gastric ulcer without hemorrhage or perforation: Secondary | ICD-10-CM

## 2017-12-05 LAB — TYPE AND SCREEN
ABO/RH(D): O POS
Antibody Screen: NEGATIVE
UNIT DIVISION: 0
Unit division: 0

## 2017-12-05 LAB — BASIC METABOLIC PANEL
Anion gap: 7 (ref 5–15)
BUN: 29 mg/dL — ABNORMAL HIGH (ref 8–23)
CO2: 26 mmol/L (ref 22–32)
CREATININE: 0.97 mg/dL (ref 0.44–1.00)
Calcium: 8.1 mg/dL — ABNORMAL LOW (ref 8.9–10.3)
Chloride: 109 mmol/L (ref 98–111)
GFR calc Af Amer: 56 mL/min — ABNORMAL LOW (ref >60)
GFR calc non Af Amer: 48 mL/min — ABNORMAL LOW (ref >60)
GLUCOSE: 98 mg/dL (ref 70–99)
Potassium: 3 mmol/L — ABNORMAL LOW (ref 3.5–5.1)
Sodium: 142 mmol/L (ref 135–145)

## 2017-12-05 LAB — CBC
HCT: 30.3 % — ABNORMAL LOW (ref 36.0–46.0)
HEMOGLOBIN: 9.8 g/dL — AB (ref 12.0–15.0)
MCH: 30.8 pg (ref 26.0–34.0)
MCHC: 32.3 g/dL (ref 30.0–36.0)
MCV: 95.3 fL (ref 80.0–100.0)
Platelets: 147 10*3/uL — ABNORMAL LOW (ref 150–400)
RBC: 3.18 MIL/uL — AB (ref 3.87–5.11)
RDW: 14.9 % (ref 11.5–15.5)
WBC: 5.9 10*3/uL (ref 4.0–10.5)
nRBC: 0 % (ref 0.0–0.2)

## 2017-12-05 LAB — BPAM RBC
BLOOD PRODUCT EXPIRATION DATE: 201912092359
BLOOD PRODUCT EXPIRATION DATE: 201912092359
ISSUE DATE / TIME: 201911140905
ISSUE DATE / TIME: 201911141219
UNIT TYPE AND RH: 5100
Unit Type and Rh: 5100

## 2017-12-05 LAB — MAGNESIUM: Magnesium: 1.9 mg/dL (ref 1.7–2.4)

## 2017-12-05 MED ORDER — POTASSIUM CHLORIDE 10 MEQ/100ML IV SOLN
10.0000 meq | INTRAVENOUS | Status: AC
  Administered 2017-12-05: 10 meq via INTRAVENOUS
  Filled 2017-12-05: qty 100

## 2017-12-05 MED ORDER — POTASSIUM CHLORIDE CRYS ER 20 MEQ PO TBCR: 40.0000 meq | EXTENDED_RELEASE_TABLET | Freq: Every day | ORAL | Status: DC

## 2017-12-05 MED ORDER — POTASSIUM CHLORIDE 20 MEQ PO PACK
20.0000 meq | PACK | Freq: Once | ORAL | Status: AC
  Administered 2017-12-05: 20 meq via ORAL
  Filled 2017-12-05: qty 1

## 2017-12-05 MED ORDER — POTASSIUM CHLORIDE CRYS ER 20 MEQ PO TBCR
20.0000 meq | EXTENDED_RELEASE_TABLET | Freq: Every day | ORAL | Status: DC
  Administered 2017-12-05 – 2017-12-06 (×2): 20 meq via ORAL
  Filled 2017-12-05 (×2): qty 1

## 2017-12-05 NOTE — Progress Notes (Signed)
PROGRESS NOTE    [name]  ZOX:[id number]  DOB: 1920/02/03  DOA: 12/03/2017 PCP: Avon Gully, MD   Brief Admission Hx: 84 y.o. female with severe significant valvular heart disease and restrictive diastolic/chronic diastolic heart failure, atrial fibrillation, symptomatic bradycardia with pacemaker, CAD, advanced age presented to ED with family complaining of 1 week of progressive fatigue and black tarry stools.   MDM/Assessment & Plan:   1. Acute blood loss anemia - Pt had an acute drop in Hg in a couple of days from 10 down to 6.8.  She is hemoccult positive.  Aspirin has been discontinued.  Pt has been started on IV protonix.  GI consultation has been requested.  GI performed EGD with gastric erosions and Cameron lesions. Type and cross and transfuse 2 units PRBC 12/04/17.  Hg now is at 9.8.     2. Extertional dyspnea - likely exacerbated by GI bleed but patient has chronic dyspnea and fatigue likely related to her significant valvular heart disease and anemia.    3. Gait instability -  PT recommending Home health PT.  4. Moderate to severe Aortic stenosis - Pt is being treated medically by cardiology with diuresis. She takes lasix 40 mg twice daily with potassium supplementation.   5. Hypokalemia - IV and oral replacement ordered.  6. AKI - likely prerenal - She was treated with IV fluid hydration in ED.  creatinine improved with hydration. Elevated BUN likely from GI bleed.   7. CAD - no chest pain symptoms, holding aspirin now due to GI bleeding.   8. Symptomatic bradycardia - Pt is s/p pacemaker placement.   9. Essential hypertension - well controlled.  Following.   10. Paroxysmal Atrial Fibrillation - Pt is not a candidate for anticoagulation per cardiology team.    DVT Prophylaxis: SCDs Code Status: Full   Family Communication: at bedside   Disposition Plan: Home when medically stabilized   Consultants:  GI   Procedures:  EGD  12/04/17  Antimicrobials:  n/a  Subjective: Pt without complaints today.  Tolerated EGD well.   Objective: Vitals:   12/04/17 1735 12/04/17 2039 12/04/17 2109 12/05/17 0530  BP: 134/60  (!) 125/51 (!) 130/57  Pulse: 64  (!) 53 62  Resp:   18 18  Temp:   (!) 97.4 F (36.3 C) 98.7 F (37.1 C)  TempSrc:   Oral Oral  SpO2: 97% 99% 100% 100%  Weight:      Height:        Intake/Output Summary (Last 24 hours) at 12/05/2017 1147 Last data filed at 12/04/2017 1648 Gross per 24 hour  Intake 1534.02 ml  Output -  Net 1534.02 ml   Filed Weights   12/03/17 0905 12/04/17 1453  Weight: 66.6 kg 66.6 kg     REVIEW OF SYSTEMS  As per history otherwise all reviewed and reported negative  Exam:  General exam: elderly female awake, alert, NAD. Cooperative.  Respiratory system: Clear. No increased work of breathing. Cardiovascular system: normal S1 & S2 heard.  Gastrointestinal system: Abdomen is nondistended, soft and nontender. Normal bowel sounds heard. Central nervous system: Alert and oriented. No focal neurological deficits. Extremities: no cyanosis or clubbing.  Data Reviewed: Basic Metabolic Panel: Recent Labs  Lab 11/28/17 1707 12/03/17 0940 12/04/17 0527 12/04/17 1556 12/05/17 0609  NA 140 142 142 143 142  K 3.4* 3.7 3.2* 3.4* 3.0*  CL 104 107 109 107 109  CO2 26 26 26   --  26  GLUCOSE 190* 124*  91 101* 98  BUN 23 38* 32* 28* 29*  CREATININE 1.07* 1.17* 1.00 1.10* 0.97  CALCIUM 9.2 9.2 8.1*  --  8.1*  MG 2.1  --  2.1  --  1.9   Liver Function Tests: Recent Labs  Lab 11/28/17 1707 12/03/17 0940  AST 24 22  ALT 13 12  ALKPHOS 75 64  BILITOT 0.8 0.6  PROT 7.9 7.2  ALBUMIN 4.0 3.7   No results for input(s): LIPASE, AMYLASE in the last 168 hours. No results for input(s): AMMONIA in the last 168 hours. CBC: Recent Labs  Lab 11/28/17 1707 12/03/17 0940 12/03/17 1811 12/04/17 0527 12/04/17 1556 12/04/17 1858 12/05/17 0609  WBC 9.6 6.7  --  5.0   --   --  5.9  NEUTROABS 8.3* 5.0  --   --   --   --   --   HGB 10.3* 8.2* 7.9* 6.8* 11.6* 10.6* 9.8*  HCT 34.0* 26.8* 26.0* 21.8* 34.0* 33.5* 30.3*  MCV 101.8* 100.4*  --  100.0  --   --  95.3  PLT 187 185  --  159  --   --  147*   Cardiac Enzymes: Recent Labs  Lab 11/28/17 1707 12/03/17 0940  TROPONINI 0.03* 0.03*   CBG (last 3)  No results for input(s): GLUCAP in the last 72 hours. Recent Results (from the past 240 hour(s))  Urine culture     Status: None   Collection Time: 12/03/17  9:20 AM  Result Value Ref Range Status   Specimen Description   Final    URINE, CATHETERIZED Performed at [facility], [address] [place], [place] [address]    Special Requests   Final    NONE Performed at [facility], [address], [place], [place] [address]    Culture   Final    NO GROWTH Performed at [facility], [address], [place], [place] [address]    Report Status 12/04/2017 FINAL  Final     Studies: No results found.   Scheduled Meds: . amLODipine  5 mg Oral Daily  . fluticasone  2 spray Each Nare Daily  . furosemide  40 mg Oral BID  . loratadine  10 mg Oral Daily  . pantoprazole (PROTONIX) IV  40 mg Intravenous Q12H  . potassium chloride  40 mEq Oral Daily  . sodium chloride flush  3 mL Intravenous Q12H   Continuous Infusions: . sodium chloride Stopped (12/04/17 1121)  . potassium chloride      Principal Problem:   Generalized weakness Active Problems:   Essential hypertension, benign   Bradycardia   Cardiac pacemaker in situ   CAD (coronary artery disease)   CHF (congestive heart failure) (HCC)   Severe tricuspid regurgitation   Atrial fibrillation (HCC)   Melena   Acute GI bleeding   Dysphagia   GERD (gastroesophageal reflux disease)   Heme positive stool   Acute blood loss anemia   Time spent:   Standley Dakins, MD, FAAFP [facility] Pager [phone]  If 7PM-7AM, please contact night-coverage [email or web address] Password  [email or web address] 12/05/2017, 11:47 AM    LOS: 2 days

## 2017-12-05 NOTE — Progress Notes (Signed)
Physical Therapy Treatment Patient Details Name: [name] MRN: [id number] DOB: 17-Apr-1920 Today's Date: 12/05/2017    History of Present Illness [name] is a 84 y.o. female with severe significant valvular heart disease and restrictive diastolic/chronic diastolic heart failure, atrial fibrillation, symptomatic bradycardia with pacemaker, CAD, advanced age presented to ED with family complaining of 1 week of progressive fatigue and black tarry stools.  Pt does take iron supplements and has black stools regularly but family reports that her stools have recently become more dark.  The patient had been seen in ED a few days ago with chest discomfort and fatigue and sent home.  Pt had a hg of 10 on 11/28/17 and it has dropped down to 8 today.  The patient's fatigue has only continued to get worse.  The patient was hemoccult test positive in the ED with black tarry stool seen.  Pt is being admitted for GI bleed and for GI consultation.  Pt had negative orthostatic vitals.      PT Comments    Patient demonstrates much improvement in functional mobility and gait requiring less assistance.  Patient able to transfer to [facility] prior to gait training without loss of balance, demonstrates slightly labored cadence with only occasional Min guard assist, limited mostly due to fatigue.  Patient instructed in and given written instructions for HEP with fair/good return demonstrated (patient's daughter's present).  Patient tolerated sitting up in chair after therapy.  Patient will benefit from continued physical therapy in hospital and recommended venue below to increase strength, balance, endurance for safe ADLs and gait.   Follow Up Recommendations  Home health PT;Supervision/Assistance - 24 hour;Supervision for mobility/OOB     Equipment Recommendations  None recommended by PT    Recommendations for Other Services       Precautions / Restrictions Precautions Precautions: Fall Restrictions Weight  Bearing Restrictions: No    Mobility  Bed Mobility Overal bed mobility: Needs Assistance Bed Mobility: Supine to Sit     Supine to sit: Supervision     General bed mobility comments: slightly labored movement  Transfers Overall transfer level: Needs assistance Equipment used: Rolling walker (2 wheeled) Transfers: Sit to/from [employer] Sit to Stand: Min guard Stand pivot transfers: Min guard       General transfer comment: labored movement with less assistance  Ambulation/Gait Ambulation/Gait assistance: Min guard Gait Distance (Feet): 65 Feet Assistive device: Rolling walker (2 wheeled) Gait Pattern/deviations: Decreased step length - right;Decreased step length - left;Decreased stride length Gait velocity: decreased   General Gait Details: increased endurance/distance for ambulation with slightly labored cadence without loss of balance   Stairs             Wheelchair Mobility    Modified Rankin (Stroke Patients Only)       Balance Overall balance assessment: Needs assistance Sitting-balance support: Feet supported;No upper extremity supported Sitting balance-Leahy Scale: Fair     Standing balance support: During functional activity;Bilateral upper extremity supported Standing balance-Leahy Scale: Fair Standing balance comment: fair with RW                            Cognition Arousal/Alertness: Awake/alert Behavior During Therapy: WFL for tasks assessed/performed Overall Cognitive Status: Within Functional Limits for tasks assessed  Exercises General Exercises - Lower Extremity Ankle Circles/Pumps: Seated;AROM;Strengthening;Both;10 reps Long Arc Quad: Seated;AROM;Strengthening;Both;10 reps Hip Flexion/Marching: Seated;AROM;Strengthening;Both;10 reps    General Comments        Pertinent Vitals/Pain Pain Assessment: No/denies pain    Home Living                       Prior Function            PT Goals (current goals can now be found in the care plan section) Acute Rehab PT Goals Patient Stated Goal: return home with family to assist PT Goal Formulation: With patient/family Time For Goal Achievement: 12/11/17 Potential to Achieve Goals: Good Progress towards PT goals: Progressing toward goals    Frequency    Min 3X/week      PT Plan Current plan remains appropriate    Co-evaluation              AM-PAC PT "6 Clicks" Daily Activity  Outcome Measure  Difficulty turning over in bed (including adjusting bedclothes, sheets and blankets)?: None Difficulty moving from lying on back to sitting on the side of the bed? : A Little Difficulty sitting down on and standing up from a chair with arms (e.g., wheelchair, bedside commode, etc,.)?: Unable Help needed moving to and from a bed to chair (including a wheelchair)?: A Little Help needed walking in hospital room?: A Little Help needed climbing 3-5 steps with a railing? : A Lot 6 Click Score: 16    End of Session   Activity Tolerance: Patient tolerated treatment well;Patient limited by fatigue Patient left: in chair;with call bell/phone within reach;with family/visitor present Nurse Communication: Mobility status PT Visit Diagnosis: Unsteadiness on feet (R26.81);Other abnormalities of gait and mobility (R26.89);Muscle weakness (generalized) (M62.81)     Time: 6440-3474 PT Time Calculation (min) (ACUTE ONLY): 25 min  Charges:  $Gait Training: 8-22 mins $Therapeutic Exercise: 8-22 mins                     2:58 PM, 12/05/17 Ocie Bob, MPT Physical Therapist with [facility] [phone] office [phone] mobile phone

## 2017-12-05 NOTE — Plan of Care (Signed)

## 2017-12-05 NOTE — Care Management Important Message (Signed)
Important Message  Patient Details  Name: [name] MRN: [id number] Date of Birth: [birth year]   Medicare Important Message Given:  Yes    Renie Ora 12/05/2017, 10:43 AM

## 2017-12-05 NOTE — Progress Notes (Signed)
Subjective: Doing ok this morning. Denies abdominal pain, N/V, further GI bleed. No further GI complaints at this time. States she is only on daily ASA for antiplatelet therapy.  Objective: Vital signs in last 24 hours: Temp:  [97.4 F (36.3 C)-98.7 F (37.1 C)] 98.7 F (37.1 C) (11/15 0530) Pulse Rate:  [53-64] 62 (11/15 0530) Resp:  [16-21] 18 (11/15 0530) BP: (93-136)/(51-83) 130/57 (11/15 0530) SpO2:  [97 %-100 %] 100 % (11/15 0530) Weight:  [66.6 kg] 66.6 kg (11/14 1453) Last BM Date: 12/04/17 General:   Alert and oriented, pleasant Head:  Normocephalic and atraumatic. Eyes:  No icterus, sclera clear. Conjuctiva pink.  Heart:  S1, S2 present, no murmurs noted.  Lungs: Clear to auscultation bilaterally, without wheezing, rales, or rhonchi.  Abdomen:  Bowel sounds present, soft, non-tender, non-distended. No HSM or hernias noted. No rebound or guarding. No masses appreciated  Msk:  Symmetrical without gross deformities. Pulses:  Normal bilateral DP pulses noted. Extremities:  Without clubbing or edema. Neurologic:  Alert and  oriented x4;  grossly normal neurologically. Psych:  Alert and cooperative. Normal mood and affect.  Intake/Output from previous day: 11/14 0701 - 11/15 0700 In: 1882 [I.V.:253.8; Blood:1352.5; IV Piggyback:275.7] Out: 400 [Urine:400] Intake/Output this shift: No intake/output data recorded.  Lab Results: Recent Labs    12/03/17 0940  12/04/17 0527 12/04/17 1556 12/04/17 1858 12/05/17 0609  WBC 6.7  --  5.0  --   --  5.9  HGB 8.2*   < > 6.8* 11.6* 10.6* 9.8*  HCT 26.8*   < > 21.8* 34.0* 33.5* 30.3*  PLT 185  --  159  --   --  147*   < > = values in this interval not displayed.   BMET Recent Labs    12/03/17 0940 12/04/17 0527 12/04/17 1556 12/05/17 0609  NA 142 142 143 142  K 3.7 3.2* 3.4* 3.0*  CL 107 109 107 109  CO2 26 26  --  26  GLUCOSE 124* 91 101* 98  BUN 38* 32* 28* 29*  CREATININE 1.17* 1.00 1.10* 0.97  CALCIUM 9.2  8.1*  --  8.1*   LFT Recent Labs    12/03/17 0940  PROT 7.2  ALBUMIN 3.7  AST 22  ALT 12  ALKPHOS 64  BILITOT 0.6   PT/INR Recent Labs    12/03/17 0940  LABPROT 14.3  INR 1.12   Hepatitis Panel No results for input(s): HEPBSAG, HCVAB, HEPAIGM, HEPBIGM in the last 72 hours.   Studies/Results: No results found.  Assessment: Pleasent 84 year old female with known hiatal hernia as well as history of hemorrhagic gastritis in the setting of anticoagulant about 2-1/2 years ago who now presents with progressive weakness and noted to have 2 g drop in her hemoglobin from a baseline of 10.3 g five days prior.  She had melena and her stool is guaiac positive.  Patient is on low-dose aspirin for antiplatelet therapy. No history of prior colonoscopy in at least 15 years.  She she has been on chronic PPI therapy and now on pantoprazole 40 mg IV every 12 hours. She was transfused 2 units PRBC amd her hgb inproved from 6.8 to 11.6. Further decline yesterday to 10.6.  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 Likely coffee-ground emesis from [facility] lesions. Recommended stopping all antiplatelet fur to benefits likely not worth the risks. Patient not likely fit for further endoscopic evaluation.  Today she feels better, no abdominal pain. No further N/V or coffee-ground emesis. No melena. Discussed stopping antiplatelet therapy and the patient and her daughter verbalized understanding.  Plan: 1. Hold anti-plattelet therapy - will message cardiology with recommendations 2. Monitor hgb 3. Monitor for any further GI bleed 4. Continued PPI bid for at least 3 months 5. Outpatient GI follow-up  6. Anticipate discharge in the next 24-48 hours if no further changes 7. Supportive measures   Thank you for allowing [place] to participate in the care of Otelia Limes, DNP, AGNP-C Adult &  Gerontological Nurse Practitioner [facility]     LOS: 2 days    12/05/2017, 1:52 PM

## 2017-12-06 DIAGNOSIS — R001 Bradycardia, unspecified: Secondary | ICD-10-CM

## 2017-12-06 DIAGNOSIS — K253 Acute gastric ulcer without hemorrhage or perforation: Secondary | ICD-10-CM

## 2017-12-06 LAB — CBC
HCT: 29 % — ABNORMAL LOW (ref 36.0–46.0)
Hemoglobin: 9.2 g/dL — ABNORMAL LOW (ref 12.0–15.0)
MCH: 30.8 pg (ref 26.0–34.0)
MCHC: 31.7 g/dL (ref 30.0–36.0)
MCV: 97 fL (ref 80.0–100.0)
PLATELETS: 150 10*3/uL (ref 150–400)
RBC: 2.99 MIL/uL — AB (ref 3.87–5.11)
RDW: 14.7 % (ref 11.5–15.5)
WBC: 6.1 10*3/uL (ref 4.0–10.5)
nRBC: 0 % (ref 0.0–0.2)

## 2017-12-06 LAB — BASIC METABOLIC PANEL
Anion gap: 7 (ref 5–15)
BUN: 31 mg/dL — ABNORMAL HIGH (ref 8–23)
CALCIUM: 8.2 mg/dL — AB (ref 8.9–10.3)
CO2: 25 mmol/L (ref 22–32)
CREATININE: 1.11 mg/dL — AB (ref 0.44–1.00)
Chloride: 107 mmol/L (ref 98–111)
GFR calc Af Amer: 47 mL/min — ABNORMAL LOW (ref >60)
GFR, EST NON AFRICAN AMERICAN: 41 mL/min — AB (ref >60)
GLUCOSE: 92 mg/dL (ref 70–99)
Potassium: 3.7 mmol/L (ref 3.5–5.1)
Sodium: 139 mmol/L (ref 135–145)

## 2017-12-06 MED ORDER — FUROSEMIDE 40 MG PO TABS: 40.0000 mg | ORAL_TABLET | Freq: Two times a day (BID) | ORAL | 6 refills | Status: DC

## 2017-12-06 MED ORDER — PANTOPRAZOLE SODIUM 40 MG PO TBEC: 40.0000 mg | DELAYED_RELEASE_TABLET | Freq: Two times a day (BID) | ORAL | Status: AC

## 2017-12-06 MED ORDER — LISINOPRIL 40 MG PO TABS: 40.0000 mg | ORAL_TABLET | Freq: Every day | ORAL | 0 refills | Status: AC

## 2017-12-06 MED ORDER — POTASSIUM CHLORIDE CRYS ER 20 MEQ PO TBCR: 20.0000 meq | EXTENDED_RELEASE_TABLET | Freq: Every day | ORAL | 2 refills | Status: AC

## 2017-12-06 NOTE — Discharge Instructions (Signed)
Please stop taking the aspirin.    Please follow up with cardiology in 2 weeks for recheck.  Please follow up with primary care provider in 1 week for recheck of CBC.     Gastrointestinal Bleeding Gastrointestinal (GI) bleeding is bleeding somewhere along the digestive tract, between the mouth and anus. This can be caused by various problems. The severity of these problems can range from mild to serious or even life-threatening. If you have GI bleeding, you may find blood in your stools (feces), you may have black stools, or you may vomit blood. If there is a lot of bleeding, you may need to stay in the hospital. What are the causes? This condition may be caused by:  Esophagitis. This is inflammation, irritation, or swelling of the esophagus.  Hemorrhoids.These are swollen veins in the rectum.  Anal fissures.These are areas of painful tearing that are often caused by passing hard stool.  Diverticulosis.These are pouches that form on the colon over time, with age, and may bleed a lot.  Diverticulitis.This is inflammation in areas with diverticulosis. It can cause pain, fever, and bloody stools, although bleeding may be mild.  Polyps and cancer. Colon cancer often starts out as precancerous polyps.  Gastritis and ulcers. With these, bleeding may come from the upper GI tract, near the stomach.  What are the signs or symptoms? Symptoms of this condition may include:  Bright red blood in your vomit, or vomit that looks like coffee grounds.  Bloody, black, or tarry stools. ? Bleeding from the lower GI tract will usually cause red or maroon blood in the stools. ? Bleeding from the upper GI tract may cause black, tarry, often bad-smelling stools. ? In certain cases, if the bleeding is fast enough, the stools may be red.  Pain or cramping in the abdomen.  How is this diagnosed? This condition may be diagnosed based on:  Medical history and physical exam.  Various tests, such  as: ? Blood tests. ? X-rays and other imaging tests. ? Esophagogastroduodenoscopy (EGD). In this test, a flexible, lighted tube is used to look at your esophagus, stomach, and small intestine. ? Colonoscopy. In this test, a flexible, lighted tube is used to look at your colon.  How is this treated? Treatment for this condition depends on the cause of the bleeding. For example:  For bleeding from the esophagus, stomach, small intestine, or colon, the health care provider doing your EGD or colonoscopy may be able to stop the bleeding as part of the procedure.  Inflammation or infection of the colon can be treated with medicines.  Certain rectal problems can be treated with creams, suppositories, or warm baths.  Surgery is sometimes needed.  Blood transfusions are sometimes needed if a lot of blood has been lost.  If bleeding is slow, you may be allowed to go home. If there is a lot of bleeding, you will need to stay in the hospital for observation. Follow these instructions at home:  Take over-the-counter and prescription medicines only as told by your health care provider.  Eat foods that are high in fiber. This will help to keep your stools soft. These foods include whole grains, legumes, fruits, and vegetables. Eating 1-3 prunes each day works well for many people.  Drink enough fluid to keep your urine clear or pale yellow.  Keep all follow-up visits as told by your health care provider. This is important. Contact a health care provider if:  Your symptoms do not improve. Get help  right away if:  Your bleeding increases.  You feel light-headed or you faint.  You feel weak.  You have severe cramps in your back or abdomen.  You pass large blood clots in your stool.  Your symptoms are getting worse. This information is not intended to replace advice given to you by your health care provider. Make sure you discuss any questions you have with your health care  provider. Document Released: 01/05/2000 Document Revised: 06/07/2015 Document Reviewed: 06/27/2014 Elsevier Interactive Patient Education  2018 [employer].    Anemia Anemia is a condition in which you do not have enough red blood cells or hemoglobin. Hemoglobin is a substance in red blood cells that carries oxygen. When you do not have enough red blood cells or hemoglobin (are anemic), your body cannot get enough oxygen and your organs may not work properly. As a result, you may feel very tired or have other problems. What are the causes? Common causes of anemia include:  Excessive bleeding. Anemia can be caused by excessive bleeding inside or outside the body, including bleeding from the intestine or from periods in women.  Poor nutrition.  Long-lasting (chronic) kidney, thyroid, and liver disease.  Bone marrow disorders.  Cancer and treatments for cancer.  HIV (human immunodeficiency virus) and AIDS (acquired immunodeficiency syndrome).  Treatments for HIV and AIDS.  Spleen problems.  Blood disorders.  Infections, medicines, and autoimmune disorders that destroy red blood cells.  What are the signs or symptoms? Symptoms of this condition include:  Minor weakness.  Dizziness.  Headache.  Feeling heartbeats that are irregular or faster than normal (palpitations).  Shortness of breath, especially with exercise.  Paleness.  Cold sensitivity.  Indigestion.  Nausea.  Difficulty sleeping.  Difficulty concentrating.  Symptoms may occur suddenly or develop slowly. If your anemia is mild, you may not have symptoms. How is this diagnosed? This condition is diagnosed based on:  Blood tests.  Your medical history.  A physical exam.  Bone marrow biopsy.  Your health care provider may also check your stool (feces) for blood and may do additional testing to look for the cause of your bleeding. You may also have other tests, including:  Imaging tests, such  as a CT scan or MRI.  Endoscopy.  Colonoscopy.  How is this treated? Treatment for this condition depends on the cause. If you continue to lose a lot of blood, you may need to be treated at a hospital. Treatment may include:  Taking supplements of iron, vitamin Q30, or folic acid.  Taking a hormone medicine (erythropoietin) that can help to stimulate red blood cell growth.  Having a blood transfusion. This may be needed if you lose a lot of blood.  Making changes to your diet.  Having surgery to remove your spleen.  Follow these instructions at home:  Take over-the-counter and prescription medicines only as told by your health care provider.  Take supplements only as told by your health care provider.  Follow any diet instructions that you were given.  Keep all follow-up visits as told by your health care provider. This is important. Contact a health care provider if:  You develop new bleeding anywhere in the body. Get help right away if:  You are very weak.  You are short of breath.  You have pain in your abdomen or chest.  You are dizzy or feel faint.  You have trouble concentrating.  You have bloody or black, tarry stools.  You vomit repeatedly or you vomit  up blood. Summary  Anemia is a condition in which you do not have enough red blood cells or enough of a substance in your red blood cells that carries oxygen (hemoglobin).  Symptoms may occur suddenly or develop slowly.  If your anemia is mild, you may not have symptoms.  This condition is diagnosed with blood tests as well as a medical history and physical exam. Other tests may be needed.  Treatment for this condition depends on the cause of the anemia. This information is not intended to replace advice given to you by your health care provider. Make sure you discuss any questions you have with your health care provider. Document Released: 02/15/2004 Document Revised: 02/09/2016 Document Reviewed:  02/09/2016 Elsevier Interactive Patient Education  2018 [place].   Blood Transfusion, Care After This sheet gives you information about how to care for yourself after your procedure. Your doctor may also give you more specific instructions. If you have problems or questions, contact your doctor. Follow these instructions at home:  Take over-the-counter and prescription medicines only as told by your doctor.  Go back to your normal activities as told by your doctor.  Follow instructions from your doctor about how to take care of the area where an IV tube was put into your vein (insertion site). Make sure you: ? Wash your hands with soap and water before you change your bandage (dressing). If there is no soap and water, use hand sanitizer. ? Change your bandage as told by your doctor.  Check your IV insertion site every day for signs of infection. Check for: ? More redness, swelling, or pain. ? More fluid or blood. ? Warmth. ? Pus or a bad smell. Contact a doctor if:  You have more redness, swelling, or pain around the IV insertion site..  You have more fluid or blood coming from the IV insertion site.  Your IV insertion site feels warm to the touch.  You have pus or a bad smell coming from the IV insertion site.  Your pee (urine) turns pink, red, or brown.  You feel weak after doing your normal activities. Get help right away if:  You have signs of a serious allergic or body defense (immune) system reaction, including: ? Itchiness. ? Hives. ? Trouble breathing. ? Anxiety. ? Pain in your chest or lower back. ? Fever, flushing, and chills. ? Fast pulse. ? Rash. ? Watery poop (diarrhea). ? Throwing up (vomiting). ? Dark pee. ? Serious headache. ? Dizziness. ? Stiff neck. ? Yellow color in your face or the white parts of your eyes (jaundice). Summary  After a blood transfusion, return to your normal activities as told by your doctor.  Every day, check for  signs of infection where the IV tube was put into your vein.  Some signs of infection are warm skin, more redness and pain, more fluid or blood, and pus or a bad smell where the needle went in.  Contact your doctor if you feel weak or have any unusual symptoms. This information is not intended to replace advice given to you by your health care provider. Make sure you discuss any questions you have with your health care provider. Document Released: 01/28/2014 Document Revised: 09/01/2015 Document Reviewed: 09/01/2015 Elsevier Interactive Patient Education  2017 [employer].

## 2017-12-06 NOTE — Plan of Care (Signed)
  Problem: Education: Goal: Knowledge of General Education information will improve Description Including pain rating scale, medication(s)/side effects and non-pharmacologic comfort measures 12/06/2017 1313 by Darreld Mclean, RN Outcome: Adequate for Discharge 12/06/2017 0943 by Darreld Mclean, RN Outcome: Progressing   Problem: Health Behavior/Discharge Planning: Goal: Ability to manage health-related needs will improve 12/06/2017 1313 by Darreld Mclean, RN Outcome: Adequate for Discharge 12/06/2017 0943 by Darreld Mclean, RN Outcome: Progressing   Problem: Clinical Measurements: Goal: Ability to maintain clinical measurements within normal limits will improve 12/06/2017 1313 by Darreld Mclean, RN Outcome: Adequate for Discharge 12/06/2017 0943 by Darreld Mclean, RN Outcome: Progressing Goal: Will remain free from infection 12/06/2017 1313 by Darreld Mclean, RN Outcome: Adequate for Discharge 12/06/2017 0943 by Darreld Mclean, RN Outcome: Progressing Goal: Diagnostic test results will improve 12/06/2017 1313 by Darreld Mclean, RN Outcome: Adequate for Discharge 12/06/2017 0943 by Darreld Mclean, RN Outcome: Progressing Goal: Respiratory complications will improve 12/06/2017 1313 by Darreld Mclean, RN Outcome: Adequate for Discharge 12/06/2017 0943 by Darreld Mclean, RN Outcome: Progressing Goal: Cardiovascular complication will be avoided 12/06/2017 1313 by Darreld Mclean, RN Outcome: Adequate for Discharge 12/06/2017 0943 by Darreld Mclean, RN Outcome: Progressing   Problem: Activity: Goal: Risk for activity intolerance will decrease 12/06/2017 1313 by Darreld Mclean, RN Outcome: Adequate for Discharge 12/06/2017 0943 by Darreld Mclean, RN Outcome: Progressing   Problem: Nutrition: Goal: Adequate nutrition will be maintained 12/06/2017 1313 by Darreld Mclean, RN Outcome: Adequate for Discharge 12/06/2017 0943 by Darreld Mclean, RN Outcome: Progressing

## 2017-12-06 NOTE — Discharge Summary (Signed)
Physician Discharge Summary  [name] ZOX:[id number] DOB: [birth year] DOA: 12/03/2017  PCP: Avon Gully, MD Cardiologist: Purvis Sheffield GIKendell Bane  Admit date: 12/03/2017 Discharge date: 12/06/2017  Admitted From: Home  Disposition: Home  Recommendations for Outpatient Follow-up:  1. Follow up with PCP in 1 weeks 2. Please obtain BMP/CBC in 1 week 3. Follow up with cardiology in 2 weeks to discuss aspirin being discontinued:  Home Health: Pt and family declined home health services  Discharge Condition: STABLE   CODE STATUS: FULL    Brief Hospitalization Summary: Please see all hospital notes, images, labs for full details of the hospitalization. HPI: [name] is a 84 y.o. female with severe significant valvular heart disease and restrictive diastolic/chronic diastolic heart failure, atrial fibrillation, symptomatic bradycardia with pacemaker, CAD, advanced age presented to ED with family complaining of 1 week of progressive fatigue and black tarry stools.  Pt does take iron supplements and has black stools regularly but family reports that her stools have recently become more dark.  The patient had been seen in ED a few days ago with chest discomfort and fatigue and sent home.  Pt had a hg of 10 on 11/28/17 and it has dropped down to 8 today.  The patient's fatigue has only continued to get worse.  The patient was hemoccult test positive in the ED with black tarry stool seen.  Pt is being admitted for GI bleed and for GI consultation.  Pt had negative orthostatic vitals.    Brief Admission Hx: 84 y.o.femalewith severe significant valvular heart disease and restrictive diastolic/chronic diastolic heart failure, atrial fibrillation, symptomatic bradycardia with pacemaker, CAD, advanced age presented to ED with family complaining of 1 week of progressive fatigue and black tarry stools.   MDM/Assessment & Plan:   1. Acute blood loss anemia - Pt had an acute drop in Hg in a couple  of days from 10 down to 6.8. She is hemoccult positive. Aspirin has been discontinued. Pt was started on IV protonix. GI consultation has been requested. GI performed EGD with gastric erosions and Cameron lesions. Type and cross and transfused 2 units PRBC 12/04/17.  Hg has remained stable at 9.2.  Aspirin has been discontinued per GI recs.     2. Extertional dyspnea - likely exacerbated by GI bleed but patient has chronic dyspnea and fatigue likely related to her significant valvular heart disease and anemia.   Pt feels back to her baseline at this time.   3. Gait instability -  PT recommending Home health PT but patient and family have declined.  4. Moderate to severe Aortic stenosis - Pt is being treated medically by cardiology with diuresis. She takes lasix 40 mg twice daily with potassium supplementation.  5. Hypokalemia - Repleted.  6. AKI - likely prerenal - She was treated with IV fluid hydration in ED. creatinine improved with hydration. Elevated BUN likely from GI bleed.  7. CAD - no chest pain symptoms.  Aspirin has been discontinued by GI.  Pt to follow up and discuss options with cardiology.  GI recommending holding all antiplatelets as risk may not be worth benefits.    8. Symptomatic bradycardia - Pt is s/p pacemaker placement.  9. Essential hypertension - well controlled. Following.  10. Paroxysmal Atrial Fibrillation - Pt is not a candidate for anticoagulation per cardiology team.  DVT Prophylaxis:SCDs Code Status:Full Family Communication:at bedside Disposition Plan:Home   Consultants:  GI   Procedures:  EGD 12/04/17 - Normal esophagus. - Large hiatal  hernia. Cameron lesions - likely source of coffee-ground emesis. Findings may not explain all of her anemia. - Erosive gastropathy. Gastric polyps. Recommendation: - Return patient to hospital ward for ongoing care. Continue PPI. Consider stopping all antiplatelet therapy as the benefits may not be  worth the risks. Dysphagia 2 diet. Do not feel patient is fit for further evaluation i.e. colonoscopy, etc. Follow H&H.  Antimicrobials:  n/a Discharge Diagnoses:  Principal Problem:   Generalized weakness Active Problems:   Essential hypertension, benign   Bradycardia   Cardiac pacemaker in situ   CAD (coronary artery disease)   CHF (congestive heart failure) (HCC)   Severe tricuspid regurgitation   Atrial fibrillation (HCC)   Melena   Acute GI bleeding   Dysphagia   GERD (gastroesophageal reflux disease)   Heme positive stool   Acute blood loss anemia   Cameron lesion, acute   Gastric erosion    Discharge Instructions: Discharge Instructions    Call MD for:  difficulty breathing, headache or visual disturbances   Complete by:  As directed    Call MD for:  extreme fatigue   Complete by:  As directed    Call MD for:  persistant dizziness or light-headedness   Complete by:  As directed    Call MD for:  persistant nausea and vomiting   Complete by:  As directed    Increase activity slowly   Complete by:  As directed      Allergies as of 12/06/2017   No Known Allergies     Medication List    STOP taking these medications   aspirin EC 81 MG tablet     TAKE these medications   acetaminophen 500 MG tablet Commonly known as:  TYLENOL Take 500 mg by mouth at bedtime.   amLODipine 5 MG tablet Commonly known as:  NORVASC Take 1 tablet (5 mg total) by mouth daily.   ammonium lactate 12 % lotion Commonly known as:  LAC-HYDRIN Apply 1 application topically daily as needed.   ferrous sulfate 325 (65 FE) MG EC tablet Take 325 mg by mouth daily.   fluticasone 50 MCG/ACT nasal spray Commonly known as:  FLONASE Place 2 sprays into both nostrils daily.   furosemide 40 MG tablet Commonly known as:  LASIX Take 1 tablet (40 mg total) by mouth 2 (two) times daily. Start taking on:  12/08/2017 What changed:  These instructions start on 12/08/2017. If you are unsure  what to do until then, ask your doctor or other care provider.   lisinopril 40 MG tablet Commonly known as:  PRINIVIL,ZESTRIL Take 1 tablet (40 mg total) by mouth daily. Start taking on:  12/08/2017 What changed:  These instructions start on 12/08/2017. If you are unsure what to do until then, ask your doctor or other care provider.   loratadine 10 MG tablet Commonly known as:  CLARITIN Take 1 tablet by mouth daily   pantoprazole 40 MG tablet Commonly known as:  PROTONIX Take 1 tablet (40 mg total) by mouth 2 (two) times daily. What changed:  when to take this   potassium chloride SA 20 MEQ tablet Commonly known as:  K-DUR,KLOR-CON Take 1 tablet (20 mEq total) by mouth daily. What changed:  when to take this      Follow-up Information    Laqueta Linden, MD. Schedule an appointment as soon as possible for a visit in 2 week(s).   Specialty:  Cardiology Why:  Hospital Follow Up  Contact information: [address] [place] [address] [phone]          No Known Allergies Allergies as of 12/06/2017   No Known Allergies     Medication List    STOP taking these medications   aspirin EC 81 MG tablet     TAKE these medications   acetaminophen 500 MG tablet Commonly known as:  TYLENOL Take 500 mg by mouth at bedtime.   amLODipine 5 MG tablet Commonly known as:  NORVASC Take 1 tablet (5 mg total) by mouth daily.   ammonium lactate 12 % lotion Commonly known as:  LAC-HYDRIN Apply 1 application topically daily as needed.   ferrous sulfate 325 (65 FE) MG EC tablet Take 325 mg by mouth daily.   fluticasone 50 MCG/ACT nasal spray Commonly known as:  FLONASE Place 2 sprays into both nostrils daily.   furosemide 40 MG tablet Commonly known as:  LASIX Take 1 tablet (40 mg total) by mouth 2 (two) times daily. Start taking on:  12/08/2017 What changed:  These instructions start on 12/08/2017. If you are unsure what to do until then, ask your doctor or  other care provider.   lisinopril 40 MG tablet Commonly known as:  PRINIVIL,ZESTRIL Take 1 tablet (40 mg total) by mouth daily. Start taking on:  12/08/2017 What changed:  These instructions start on 12/08/2017. If you are unsure what to do until then, ask your doctor or other care provider.   loratadine 10 MG tablet Commonly known as:  CLARITIN Take 1 tablet by mouth daily   pantoprazole 40 MG tablet Commonly known as:  PROTONIX Take 1 tablet (40 mg total) by mouth 2 (two) times daily. What changed:  when to take this   potassium chloride SA 20 MEQ tablet Commonly known as:  K-DUR,KLOR-CON Take 1 tablet (20 mEq total) by mouth daily. What changed:  when to take this       Procedures/Studies: Dg Chest Port 1 View  Result Date: 12/03/2017 CLINICAL DATA:  Weakness EXAM: PORTABLE CHEST 1 VIEW COMPARISON:  11/28/2017 FINDINGS: Left pacer remains in place, unchanged. Cardiomegaly. Large hiatal hernia. Minimal bibasilar atelectasis. No overt edema. Possible small effusions. No acute bony abnormality. IMPRESSION: Cardiomegaly with bibasilar atelectasis and probable small effusions. Large hiatal hernia. Electronically Signed   By: Charlett Nose M.D.   On: 12/03/2017 09:38   Dg Chest Portable 1 View  Result Date: 11/28/2017 CLINICAL DATA:  Chest pain EXAM: PORTABLE CHEST 1 VIEW COMPARISON:  07/03/2017 FINDINGS: Marked cardiopericardial enlargement. There is also a superimposed sizable hiatal hernia. Dual-chamber pacer leads from the left in stable position. There is no edema, consolidation, effusion, or pneumothorax. IMPRESSION: No acute finding. Cardiomegaly and large hiatal hernia. Electronically Signed   By: Marnee Spring M.D.   On: 11/28/2017 16:52      Subjective: Pt says that she feels great today.  No complaints at all and eating and drinking well.    Discharge Exam: Vitals:   12/05/17 2107 12/06/17 0528  BP: 120/68 (!) 104/48  Pulse: 60 (!) 59  Resp: 18 16  Temp: 97.9 F  (36.6 C) 98.5 F (36.9 C)  SpO2: 100% 98%   Vitals:   12/05/17 0530 12/05/17 1414 12/05/17 2107 12/06/17 0528  BP: (!) 130/57 (!) 120/56 120/68 (!) 104/48  Pulse: 62 (!) 57 60 (!) 59  Resp: 18 17 18 16   Temp: 98.7 F (37.1 C) 97.8 F (36.6 C) 97.9 F (36.6 C) 98.5 F (36.9 C)  TempSrc: Oral Oral  Oral Oral  SpO2: 100% 93% 100% 98%  Weight:      Height:        General: Pt is alert, awake, not in acute distress Cardiovascular: RRR, S1/S2 +, no rubs, no gallops Respiratory: CTA bilaterally, no wheezing, no rhonchi Abdominal: Soft, NT, ND, bowel sounds + Extremities: no edema, no cyanosis   The results of significant diagnostics from this hospitalization (including imaging, microbiology, ancillary and laboratory) are listed below for reference.     Microbiology: Recent Results (from the past 240 hour(s))  Urine culture     Status: None   Collection Time: 12/03/17  9:20 AM  Result Value Ref Range Status   Specimen Description   Final    URINE, CATHETERIZED Performed at [facility], [address], [place], [place] [address]    Special Requests   Final    NONE Performed at [facility], [address] [place], [place] [address]    Culture   Final    NO GROWTH Performed at [facility], [address] [place], [place] [address]    Report Status 12/04/2017 FINAL  Final     Labs: BNP (last 3 results) Recent Labs    07/03/17 1336 11/28/17 1707  BNP 280.0* 244.0*   Basic Metabolic Panel: Recent Labs  Lab 12/03/17 0940 12/04/17 0527 12/04/17 1556 12/05/17 0609 12/06/17 0620  NA 142 142 143 142 139  K 3.7 3.2* 3.4* 3.0* 3.7  CL 107 109 107 109 107  CO2 26 26  --  26 25  GLUCOSE 124* 91 101* 98 92  BUN 38* 32* 28* 29* 31*  CREATININE 1.17* 1.00 1.10* 0.97 1.11*  CALCIUM 9.2 8.1*  --  8.1* 8.2*  MG  --  2.1  --  1.9  --    Liver Function Tests: Recent Labs  Lab 12/03/17 0940  AST 22  ALT 12  ALKPHOS 64  BILITOT 0.6  PROT 7.2  ALBUMIN 3.7    No results for input(s): LIPASE, AMYLASE in the last 168 hours. No results for input(s): AMMONIA in the last 168 hours. CBC: Recent Labs  Lab 12/03/17 0940  12/04/17 0527 12/04/17 1556 12/04/17 1858 12/05/17 0609 12/06/17 0620  WBC 6.7  --  5.0  --   --  5.9 6.1  NEUTROABS 5.0  --   --   --   --   --   --   HGB 8.2*   < > 6.8* 11.6* 10.6* 9.8* 9.2*  HCT 26.8*   < > 21.8* 34.0* 33.5* 30.3* 29.0*  MCV 100.4*  --  100.0  --   --  95.3 97.0  PLT 185  --  159  --   --  147* 150   < > = values in this interval not displayed.   Cardiac Enzymes: Recent Labs  Lab 12/03/17 0940  TROPONINI 0.03*   BNP: Invalid input(s): POCBNP CBG: No results for input(s): GLUCAP in the last 168 hours. D-Dimer No results for input(s): DDIMER in the last 72 hours. Hgb A1c No results for input(s): HGBA1C in the last 72 hours. Lipid Profile No results for input(s): CHOL, HDL, LDLCALC, TRIG, CHOLHDL, LDLDIRECT in the last 72 hours. Thyroid function studies No results for input(s): TSH, T4TOTAL, T3FREE, THYROIDAB in the last 72 hours.  Invalid input(s): FREET3 Anemia work up Recent Labs    12/03/17 1408 12/03/17 1619  VITAMINB12 323  --   FOLATE  --  20.5  FERRITIN 37  --  TIBC 392  --   IRON 27*  --   RETICCTPCT 3.3*  --    Urinalysis    Component Value Date/Time   COLORURINE YELLOW 12/03/2017 0920   APPEARANCEUR CLEAR 12/03/2017 0920   LABSPEC 1.020 12/03/2017 0920   PHURINE 5.0 12/03/2017 0920   GLUCOSEU NEGATIVE 12/03/2017 0920   HGBUR NEGATIVE 12/03/2017 0920   BILIRUBINUR NEGATIVE 12/03/2017 0920   KETONESUR NEGATIVE 12/03/2017 0920   PROTEINUR NEGATIVE 12/03/2017 0920   UROBILINOGEN 0.2 11/10/2006 1331   NITRITE NEGATIVE 12/03/2017 0920   LEUKOCYTESUR NEGATIVE 12/03/2017 0920   Sepsis Labs Invalid input(s): PROCALCITONIN,  WBC,  LACTICIDVEN Microbiology Recent Results (from the past 240 hour(s))  Urine culture     Status: None   Collection Time: 12/03/17  9:20 AM   Result Value Ref Range Status   Specimen Description   Final    URINE, CATHETERIZED Performed at [facility], [address] [place], [place] [address]    Special Requests   Final    NONE Performed at [facility], [address] [place], [place] [address]    Culture   Final    NO GROWTH Performed at [facility], [address], [place], [place] [address]    Report Status 12/04/2017 FINAL  Final   Time coordinating discharge: 33 minutes   SIGNED:  Standley Dakins, MD  [facility] 12/06/2017, 11:34 AM Pager [phone]  If 7PM-7AM, please contact night-coverage [email or web address] Password [email or web address]

## 2017-12-06 NOTE — Plan of Care (Signed)

## 2017-12-08 ENCOUNTER — Encounter: Payer: Self-pay | Admitting: Internal Medicine

## 2017-12-12 ENCOUNTER — Other Ambulatory Visit: Payer: Self-pay | Admitting: *Deleted

## 2017-12-12 DIAGNOSIS — I10 Essential (primary) hypertension: Secondary | ICD-10-CM | POA: Diagnosis not present

## 2017-12-12 DIAGNOSIS — D649 Anemia, unspecified: Secondary | ICD-10-CM | POA: Diagnosis not present

## 2017-12-12 DIAGNOSIS — K922 Gastrointestinal hemorrhage, unspecified: Secondary | ICD-10-CM | POA: Diagnosis not present

## 2017-12-12 DIAGNOSIS — Z0000 Encounter for general adult medical examination without abnormal findings: Secondary | ICD-10-CM | POA: Diagnosis not present

## 2017-12-12 DIAGNOSIS — I2510 Atherosclerotic heart disease of native coronary artery without angina pectoris: Secondary | ICD-10-CM | POA: Diagnosis not present

## 2017-12-12 NOTE — Patient Outreach (Signed)
[name]) Care Management  12/12/2017  [name] 1921/01/02 [id number]   EMMI-general discharge, RED ON EMMI ALERT Day # 1 Date: Thursday 12/11/17 1011 Red Alert Reason: Sad/hopeless/anxious/empty? yes  Insurance: Medicare and medicaid [place] access [facility] admissions x 1 ED visits x 1 in the last 6 months    Outreach attempt # 1, successful outreach to her home number  Patient along with her daughter [name] is able to verify HIPAA [name] Care Management RN reviewed and addressed red alert with patient [name] states the answer yes was incorrect  She and [name] confirms that [name] is not Sad/hopeless/anxious/empty [name] states sometimes she gets confused  They reports she is doing very well at home and denies any medical concerns at this time [name] wishes to share [name] information with her sister [name] and CM provided information for [name] to write down   Social: [name] is widowed and has good support from her children and private duty sitters for care and medical appointments She is needing assist with all care She is mobile with a rolling walker  Conditions: Melena. HTN,  HCAP, GI bleed, GERD, dysphagia, HDL, systolic murmur,  hiatal hernia, severe anemia  severe significant valvular heart disease and restrictive diastolic/chronic diastolic heart failure, atrial fibrillation, symptomatic bradycardia with pacemaker, CAD  DME RW, scales, [name] bed, shower chair with back, w/c Medications: denies concerns with taking medications as prescribed, affording medications, side effects of medications and questions about medications    Appointments: she has follow up with Dr [name] within the next 1-2 weeks and with her cardiologist in 2020   Advance Directives:Denies need for assist with advance directives    Consent: [facility] RN CM reviewed [name] services with patient. Patient gave verbal consent for services.   Advised patient that there will be further  automated EMMI- post discharge calls to assess how the patient is doing following the recent hospitalization Advised the patient that another call may be received from a nurse if any of their responses were abnormal. Patient voiced understanding and was appreciative of f/u call.   Plan: [facility] RN CM will close case at this time as patient has been assessed and no needs identified.   [facility] RN CM sent a successful outreach letter as discussed with [name] brochure enclosed for review  Pt encouraged to return a call to [name] RN CM prn    Ashley Simmon L. Noelle Penner, RN, BSN, CCM [name] Telephonic Care Management Care Coordinator Office number [phone] Mobile number [phone]  Main [facility] number [phone] Fax number [phone]

## 2017-12-16 ENCOUNTER — Encounter: Payer: Self-pay | Admitting: Nurse Practitioner

## 2017-12-16 ENCOUNTER — Encounter

## 2017-12-16 ENCOUNTER — Ambulatory Visit (INDEPENDENT_AMBULATORY_CARE_PROVIDER_SITE_OTHER): Payer: Medicare Other | Admitting: Nurse Practitioner

## 2017-12-16 VITALS — BP 116/76 | HR 83 | Temp 97.1°F | Ht 63.0 in | Wt 144.2 lb

## 2017-12-16 DIAGNOSIS — I25118 Atherosclerotic heart disease of native coronary artery with other forms of angina pectoris: Secondary | ICD-10-CM

## 2017-12-16 DIAGNOSIS — K922 Gastrointestinal hemorrhage, unspecified: Secondary | ICD-10-CM

## 2017-12-16 DIAGNOSIS — D62 Acute posthemorrhagic anemia: Secondary | ICD-10-CM

## 2017-12-16 DIAGNOSIS — K219 Gastro-esophageal reflux disease without esophagitis: Secondary | ICD-10-CM

## 2017-12-16 NOTE — Assessment & Plan Note (Signed)
Asymptomatic from a GERD standpoint on twice daily PPI.  Recommend she continue this for a minimum of 3 months at which point we can consider backing off to once daily.  Follow-up as already scheduled.

## 2017-12-16 NOTE — Progress Notes (Signed)
Referring Provider: Avon Gully, MD Primary Care Physician:  Avon Gully, MD Primary GI:  Dr. Jena Gauss  Chief Complaint  Patient presents with  . GI Bleeding    f/u  . Gastroesophageal Reflux    HPI:   [name] is a 84 y.o. female who presents for follow-up on GERD and GI bleed.  The patient has a history of GI bleed with hospitalization in 2017 for severe anemia and melena with a hemoglobin of 5.3 status post transfusion with EGD showing no acute bleeding.  Noted to "get strangled easily" but typically with thick mucus.  Dark stools on iron.  History of severe diffuse esophageal dysmotility.  Hemoglobin on 11/22/2016 was 11.1.  The patient was admitted to the hospital from 12/03/2017 through 12/06/2017 for GI bleed.  She has a history of significant valvular heart disease and restrictive diastolic/chronic diastolic heart failure, A. fib, symptomatic bradycardia with a pacemaker, CAD.  She presented complaining of fatigue and black, tarry stools.  Hemoglobin of 10 on 11/28/2017 which dropped to 8 on 12/03/2017.  Worsening fatigue.  Heme positive in the ER.  Her hemoglobin bottomed out at 6.8.  Aspirin was discontinued and she was started on IV Protonix.  EGD was performed showed gastric erosions and Cameron lesions.  The patient was given 2 units of PRBCs and hemoglobin stabilized at 9.2.  Aspirin was formally discontinued based on recommendations.  At discharge it was recommended she complete repeat blood work in 1 week.  This does not appear to have been completed.  Today she is accompanied by her daughter. Today she states she's doing well since hospitalization, is taking Protonix twice daily (morning and after lunch). Still with a lot of fatigue. She states they went to Dr. Felecia Shelling (PCP) who did CBC on Friday (drawn in their office). No longer taking ASA. Denies abdominal pain, N/V, hematochezia. Has dark stools on iron. Still with weakness and fatigue. Denies chest pain, dyspnea,  dizziness, lightheadedness, syncope, near syncope. Denies any other upper or lower GI symptoms.  Past Medical History:  Diagnosis Date  . Anemia   . Anxiety   . Atrial fibrillation (HCC)   . DJD (degenerative joint disease)   . Gastritis   . GI bleed   . Hypertension   . Osteoporosis   . Seasonal allergies   . Sleep apnea     Past Surgical History:  Procedure Laterality Date  . CHOLECYSTECTOMY    . ESOPHAGOGASTRODUODENOSCOPY N/A 05/16/2015   Procedure: ESOPHAGOGASTRODUODENOSCOPY (EGD);  Surgeon: West Bali, MD;  [facility];  Service: Endoscopy;  Laterality: N/A;  . ESOPHAGOGASTRODUODENOSCOPY (EGD) WITH PROPOFOL N/A 12/04/2017   Procedure: ESOPHAGOGASTRODUODENOSCOPY (EGD) WITH PROPOFOL;  Surgeon: Corbin Ade, MD;  Location: [facility];  Service: Endoscopy;  Laterality: N/A;  . HEMORRHOID SURGERY    . PACEMAKER INSERTION    . PPM GENERATOR CHANGEOUT N/A 08/02/2016   Procedure: PPM Generator Changeout;  Surgeon: Marinus Maw, MD;  Location: [facility] INVASIVE CV LAB;  Service: Cardiovascular;  Laterality: N/A;  . TOTAL HIP ARTHROPLASTY      Current Outpatient Medications  Medication Sig Dispense Refill  . acetaminophen (TYLENOL) 500 MG tablet Take 500 mg by mouth at bedtime.    Marland Kitchen amLODipine (NORVASC) 5 MG tablet Take 1 tablet (5 mg total) by mouth daily. 90 tablet 3  . ammonium lactate (LAC-HYDRIN) 12 % lotion Apply 1 application topically daily as needed.  0  . ferrous sulfate 325 (65 FE) MG  EC tablet Take 325 mg by mouth daily.     . fluticasone (FLONASE) 50 MCG/ACT nasal spray Place 2 sprays into both nostrils daily.    . furosemide (LASIX) 40 MG tablet Take 1 tablet (40 mg total) by mouth 2 (two) times daily. 60 tablet 6  . lisinopril (PRINIVIL,ZESTRIL) 40 MG tablet Take 1 tablet (40 mg total) by mouth daily. 90 tablet 0  . loratadine (CLARITIN) 10 MG tablet Take 1 tablet by mouth daily  0  . pantoprazole (PROTONIX) 40 MG tablet Take 1 tablet (40 mg total)  by mouth 2 (two) times daily.    . potassium chloride SA (K-DUR,KLOR-CON) 20 MEQ tablet Take 1 tablet (20 mEq total) by mouth daily. 90 tablet 2   No current facility-administered medications for this visit.     Allergies as of 12/16/2017  . (No Known Allergies)    Family History  Problem Relation Age of Onset  . Colon cancer Father   . Colon cancer Paternal Aunt   . Crohn's disease Son     Social History   Socioeconomic History  . Marital status: Widowed    Spouse name: Not on file  . Number of children: Not on file  . Years of education: Not on file  . Highest education level: Not on file  Occupational History  . Not on file  Social Needs  . Financial resource strain: Not on file  . Food insecurity:    Worry: Not on file    Inability: Not on file  . Transportation needs:    Medical: Not on file    Non-medical: Not on file  Tobacco Use  . Smoking status: Never Smoker  . Smokeless tobacco: Never Used  Substance and Sexual Activity  . Alcohol use: No    Alcohol/week: 0.0 standard drinks  . Drug use: No  . Sexual activity: Not on file  Lifestyle  . Physical activity:    Days per week: Not on file    Minutes per session: Not on file  . Stress: Not on file  Relationships  . Social connections:    Talks on phone: Not on file    Gets together: Not on file    Attends religious service: Not on file    Active member of club or organization: Not on file    Attends meetings of clubs or organizations: Not on file    Relationship status: Not on file  Other Topics Concern  . Not on file  Social History Narrative   Widowed   Daughter    Review of Systems: General: Negative for anorexia, weight loss, fever, chills, fatigue, weakness. ENT: Negative for hoarseness, difficulty swallowing , nasal congestion. CV: Negative for chest pain, angina, palpitations, peripheral edema.  Respiratory: Negative for dyspnea at rest, cough, sputum, wheezing.  GI: See history of  present illness. Endo: Negative for unusual weight change.  Heme: Negative for bruising or bleeding.   Physical Exam: BP 116/76   Pulse 83   Temp (!) 97.1 F (36.2 C) (Oral)   Ht 5\' 3"  (1.6 m)   Wt 144 lb 3.2 oz (65.4 kg)   BMI 25.54 kg/m  General:   Alert and oriented. Pleasant and cooperative. Well-nourished and well-developed.  Eyes:  Without icterus, sclera clear and conjunctiva pink.  Ears:  Normal auditory acuity. Cardiovascular:  S1, S2 present without murmurs appreciated. Extremities without clubbing or edema. Respiratory:  Clear to auscultation bilaterally. No wheezes, rales, or rhonchi. No distress.  Gastrointestinal:  +  BS, soft, non-tender and non-distended. No HSM noted. No guarding or rebound. No masses appreciated.  Rectal:  Deferred  Musculoskalatal:  Symmetrical without gross deformities. Neurologic:  Alert and oriented x4;  grossly normal neurologically. Psych:  Alert and cooperative. Normal mood and affect. Heme/Lymph/Immune: No excessive bruising noted.    12/16/2017 11:07 AM   Disclaimer: This note was dictated with voice recognition software. Similar sounding words can inadvertently be transcribed and may not be corrected upon review.

## 2017-12-16 NOTE — Patient Instructions (Signed)
1. We will request your blood results from Dr. Felecia Shelling. 2. Follow-up at your appointment already scheduled for the end of January.  We can repeat your blood work at this time. 3. As we discussed, call us if you see any worsening bleeding or have significant worsening fatigue or weakness. 4. Call us if you have any questions or concerns.  At [facility] we value your feedback. You may receive a survey about your visit today. Please share your experience as we strive to create trusting relationships with our patients to provide genuine, compassionate, quality care.  We appreciate your understanding and patience as we review any laboratory studies, imaging, and other diagnostic tests that are ordered as we care for you. Our office policy is 5 business days for review of these results, and any emergent or urgent results are addressed in a timely manner for your best interest. If you do not hear from our office in 1 week, please contact us.   We also encourage the use of MyChart, which contains your medical information for your review as well. If you are not enrolled in this feature, an access code is on this after visit summary for your convenience. Thank you for allowing [place] to be involved in your care.  It was great to see you today!  I hope you have a Happy Thanksgiving!!

## 2017-12-16 NOTE — Progress Notes (Signed)
cc'ed to pcp °

## 2017-12-16 NOTE — Assessment & Plan Note (Deleted)
Status post hospitalization for acute GI bleed due to duodenal erosions and Cameron lesions.  She was recommended to stop taking aspirin.  She has no longer taking aspirin.  She remains on PPI twice daily.  Recommend she continue this for at least 3 months at which point we can consider backing off.  She just had blood work done last week with her primary care and we will request the CBC results.  Follow-up as already scheduled in about 1-1/2 months.  Recommended she call our office or present to the emergency department for any worsening bleeding.

## 2017-12-16 NOTE — Assessment & Plan Note (Signed)
Acute blood loss anemia on presentation to the emergency department for worsening fatigue and weakness with a hemoglobin minimum of 6.8.  On discharge she was in the 9 range after transfusion of 2 units.  EGD as per HPI.  Recommend twice daily PPI for minimum 3 months.  We can consider backing off to once daily at that point depending on her clinical progression.  She does had CBC drawn and we will request these results.  Assuming stability we will plan for repeat CBC at her follow-up in about 1-1/2 months.  Call for any worsening bleeding.  Overall she is still fatigued.  Her daughter states she is much stronger than she was in the hospital, but her mother feels she should be stronger despite her age of 58.  Her daughter overall feels she is much improved.

## 2017-12-16 NOTE — Assessment & Plan Note (Signed)
Status post hospitalization for acute GI bleed due to duodenal erosions and Cameron lesions.  She was recommended to stop taking aspirin.  She has no longer taking aspirin.  She remains on PPI twice daily.  Recommend she continue this for at least 3 months at which point we can consider backing off.  She just had blood work done last week with her primary care and we will request the CBC results.  Follow-up as already scheduled in about 1-1/2 months.  Recommended she call our office or present to the emergency department for any worsening bleeding. 

## 2018-01-05 DIAGNOSIS — I5032 Chronic diastolic (congestive) heart failure: Secondary | ICD-10-CM | POA: Diagnosis not present

## 2018-01-05 DIAGNOSIS — I480 Paroxysmal atrial fibrillation: Secondary | ICD-10-CM | POA: Diagnosis not present

## 2018-01-05 DIAGNOSIS — Z1389 Encounter for screening for other disorder: Secondary | ICD-10-CM | POA: Diagnosis not present

## 2018-01-05 DIAGNOSIS — Z1331 Encounter for screening for depression: Secondary | ICD-10-CM | POA: Diagnosis not present

## 2018-01-05 DIAGNOSIS — I2510 Atherosclerotic heart disease of native coronary artery without angina pectoris: Secondary | ICD-10-CM | POA: Diagnosis not present

## 2018-01-05 DIAGNOSIS — I10 Essential (primary) hypertension: Secondary | ICD-10-CM | POA: Diagnosis not present

## 2018-01-09 LAB — CUP PACEART REMOTE DEVICE CHECK
Battery Voltage: 2.78 V
Brady Statistic AP VS Percent: 3 %
Brady Statistic AS VP Percent: 22 %
Date Time Interrogation Session: 20191104130742
Implantable Lead Implant Date: 20060926
Implantable Lead Location: 753859
Implantable Lead Model: 5076
Lead Channel Pacing Threshold Amplitude: 0.625 V
Lead Channel Pacing Threshold Amplitude: 0.75 V
Lead Channel Pacing Threshold Pulse Width: 0.4 ms
Lead Channel Pacing Threshold Pulse Width: 0.4 ms
Lead Channel Setting Pacing Amplitude: 1.5 V
Lead Channel Setting Pacing Pulse Width: 0.4 ms
MDC IDC LEAD IMPLANT DT: 20060926
MDC IDC LEAD LOCATION: 753860
MDC IDC MSMT BATTERY IMPEDANCE: 111 Ohm
MDC IDC MSMT BATTERY REMAINING LONGEVITY: 127 mo
MDC IDC MSMT LEADCHNL RA IMPEDANCE VALUE: 426 Ohm
MDC IDC MSMT LEADCHNL RV IMPEDANCE VALUE: 415 Ohm
MDC IDC PG IMPLANT DT: 20180713
MDC IDC SET LEADCHNL RV PACING AMPLITUDE: 2.5 V
MDC IDC SET LEADCHNL RV SENSING SENSITIVITY: 5.6 mV
MDC IDC STAT BRADY AP VP PERCENT: 73 %
MDC IDC STAT BRADY AS VS PERCENT: 2 %

## 2018-02-02 ENCOUNTER — Encounter: Payer: Self-pay | Admitting: Cardiovascular Disease

## 2018-02-02 ENCOUNTER — Ambulatory Visit (INDEPENDENT_AMBULATORY_CARE_PROVIDER_SITE_OTHER): Payer: Medicare Other | Admitting: Cardiovascular Disease

## 2018-02-02 VITALS — BP 140/80 | HR 90 | Ht 63.0 in | Wt 146.0 lb

## 2018-02-02 DIAGNOSIS — R0602 Shortness of breath: Secondary | ICD-10-CM | POA: Diagnosis not present

## 2018-02-02 DIAGNOSIS — R5383 Other fatigue: Secondary | ICD-10-CM | POA: Diagnosis not present

## 2018-02-02 DIAGNOSIS — I442 Atrioventricular block, complete: Secondary | ICD-10-CM

## 2018-02-02 DIAGNOSIS — I25118 Atherosclerotic heart disease of native coronary artery with other forms of angina pectoris: Secondary | ICD-10-CM

## 2018-02-02 DIAGNOSIS — I350 Nonrheumatic aortic (valve) stenosis: Secondary | ICD-10-CM

## 2018-02-02 DIAGNOSIS — I10 Essential (primary) hypertension: Secondary | ICD-10-CM

## 2018-02-02 DIAGNOSIS — I480 Paroxysmal atrial fibrillation: Secondary | ICD-10-CM | POA: Diagnosis not present

## 2018-02-02 DIAGNOSIS — Z950 Presence of cardiac pacemaker: Secondary | ICD-10-CM | POA: Diagnosis not present

## 2018-02-02 DIAGNOSIS — I38 Endocarditis, valve unspecified: Secondary | ICD-10-CM

## 2018-02-02 DIAGNOSIS — I5032 Chronic diastolic (congestive) heart failure: Secondary | ICD-10-CM

## 2018-02-02 NOTE — Progress Notes (Signed)
SUBJECTIVE: The patient presents for routine follow-up.  She was hospitalized for a GI bleed in November 2019 and aspirin was discontinued.  EGD demonstrated gastric erosions and Cameron lesions.  She had to be transfused 2 units of packed red blood cells.  She had an exacerbation of chronic exertional dyspnea which improved with blood transfusion.  Echocardiogram 07/03/2017 demonstrated normal left ventricular systolic function, LVEF 65 to 38%. There was normal regional wall motion. There was restrictive diastolic physiology with increased filling pressures. There is moderate to severe aortic valve stenosis and mild to moderate aortic regurgitation. There was moderate mitral regurgitation and severe tricuspid regurgitation. There was severe pulmonary hypertension.  She also has chronic diastolic heart failure, coronary artery disease, and paroxysmal atrial fibrillation. She is not an anticoagulation candidate.  She is here with her daughter, [name].  She said she is not in any pain.  Exertional dyspnea which is chronic is stable.  She wishes she had more energy.  She said her appetite is good.  Glenda sister recently had a heart attack and was hospitalized for 2 weeks at [facility] but is now at the [facility].   Review of Systems: As per "subjective", otherwise negative.  No Known Allergies  Current Outpatient Medications  Medication Sig Dispense Refill  . acetaminophen (TYLENOL) 500 MG tablet Take 500 mg by mouth at bedtime.    Marland Kitchen amLODipine (NORVASC) 5 MG tablet Take 1 tablet (5 mg total) by mouth daily. 90 tablet 3  . ammonium lactate (LAC-HYDRIN) 12 % lotion Apply 1 application topically daily as needed.  0  . ferrous sulfate 325 (65 FE) MG EC tablet Take 325 mg by mouth daily.     . fluticasone (FLONASE) 50 MCG/ACT nasal spray Place 2 sprays into both nostrils daily.    . furosemide (LASIX) 40 MG tablet Take 1 tablet (40 mg total) by mouth 2 (two) times daily. 60 tablet  6  . lisinopril (PRINIVIL,ZESTRIL) 40 MG tablet Take 1 tablet (40 mg total) by mouth daily. 90 tablet 0  . loratadine (CLARITIN) 10 MG tablet Take 1 tablet by mouth daily  0  . pantoprazole (PROTONIX) 40 MG tablet Take 1 tablet (40 mg total) by mouth 2 (two) times daily.    . potassium chloride SA (K-DUR,KLOR-CON) 20 MEQ tablet Take 1 tablet (20 mEq total) by mouth daily. 90 tablet 2   No current facility-administered medications for this visit.     Past Medical History:  Diagnosis Date  . Anemia   . Anxiety   . Atrial fibrillation (HCC)   . DJD (degenerative joint disease)   . Gastritis   . GI bleed   . Hypertension   . Osteoporosis   . Seasonal allergies   . Sleep apnea     Past Surgical History:  Procedure Laterality Date  . CHOLECYSTECTOMY    . ESOPHAGOGASTRODUODENOSCOPY N/A 05/16/2015   Procedure: ESOPHAGOGASTRODUODENOSCOPY (EGD);  Surgeon: West Bali, MD;  [facility];  Service: Endoscopy;  Laterality: N/A;  . ESOPHAGOGASTRODUODENOSCOPY (EGD) WITH PROPOFOL N/A 12/04/2017   Procedure: ESOPHAGOGASTRODUODENOSCOPY (EGD) WITH PROPOFOL;  Surgeon: Corbin Ade, MD;  [facility];  Service: Endoscopy;  Laterality: N/A;  . HEMORRHOID SURGERY    . PACEMAKER INSERTION    . PPM GENERATOR CHANGEOUT N/A 08/02/2016   Procedure: PPM Generator Changeout;  Surgeon: Marinus Maw, MD;  Location: [facility] INVASIVE CV LAB;  Service: Cardiovascular;  Laterality: N/A;  . TOTAL HIP ARTHROPLASTY  Social History   Socioeconomic History  . Marital status: Widowed    Spouse name: Not on file  . Number of children: Not on file  . Years of education: Not on file  . Highest education level: Not on file  Occupational History  . Not on file  Social Needs  . Financial resource strain: Not on file  . Food insecurity:    Worry: Not on file    Inability: Not on file  . Transportation needs:    Medical: Not on file    Non-medical: Not on file  Tobacco Use  .  Smoking status: Never Smoker  . Smokeless tobacco: Never Used  Substance and Sexual Activity  . Alcohol use: No    Alcohol/week: 0.0 standard drinks  . Drug use: No  . Sexual activity: Not on file  Lifestyle  . Physical activity:    Days per week: Not on file    Minutes per session: Not on file  . Stress: Not on file  Relationships  . Social connections:    Talks on phone: Not on file    Gets together: Not on file    Attends religious service: Not on file    Active member of club or organization: Not on file    Attends meetings of clubs or organizations: Not on file    Relationship status: Not on file  . Intimate partner violence:    Fear of current or ex partner: Not on file    Emotionally abused: Not on file    Physically abused: Not on file    Forced sexual activity: Not on file  Other Topics Concern  . Not on file  Social History Narrative   Widowed   Daughter     Vitals:   02/02/18 1347  BP: 140/80  Pulse: 90  SpO2: 95%  Weight: 146 lb (66.2 kg)  Height: 5\' 3"  (1.6 m)    Wt Readings from Last 3 Encounters:  02/02/18 146 lb (66.2 kg)  12/16/17 144 lb 3.2 oz (65.4 kg)  12/04/17 146 lb 13.2 oz (66.6 kg)     PHYSICAL EXAM General: NAD HEENT: Normal. Neck: No JVD, no thyromegaly. Lungs: Clear to auscultation bilaterally with normal respiratory effort. CV: Regular rate and irregular rhythm, normal S1/S2, no S3, 3/6 systolic murmur loudest over RUSB, but heard along left sternal border as well (unchanged). No pretibial or periankle edema.   Abdomen: Soft, nontender, no distention.  Neurologic: Alert and oriented.  Psych: Normal affect. Skin: Normal. Musculoskeletal: No gross deformities.    ECG: Reviewed above under Subjective   Labs: Lab Results  Component Value Date/Time   K 3.7 12/06/2017 06:20 AM   BUN 31 (H) 12/06/2017 06:20 AM   BUN 18 07/30/2016 12:39 PM   CREATININE 1.11 (H) 12/06/2017 06:20 AM   ALT 12 12/03/2017 09:40 AM   TSH 2.676  11/22/2016 10:40 AM   HGB 9.2 (L) 12/06/2017 06:20 AM   HGB 13.1 07/30/2016 12:39 PM     Lipids: No results found for: LDLCALC, LDLDIRECT, CHOL, TRIG, HDL     ASSESSMENT AND PLAN:  1. Exertional dyspnea and fatigue: She has significant valvular heart disease and restrictive diastolic dysfunction/chronic diastolic heart failure, but overall symptoms are stable.    Symptoms are multifactorial in etiology.Most recent echocardiogram reviewed above with restrictive diastolic physiology and elevated filling pressures, moderate mitral, mild to moderate aortic, and severe tricuspid regurgitation. There was moderate to severe aortic stenosis.  Continue Lasix 40 mg twice  daily with supplemental potassium.  She also wears compression stockings. She would be a suboptimal candidate for valve replacement percutaneously given the fact that she is not a candidate for antiplatelet therapy.  2. Coronary disease:Symptomatically stable. Not a candidate for antiplatelet therapy.  3. Symptomatic bradycardia status post pacemaker:Normal device function when last checked.  4. Hypertension: BP is reasonably controlled.  No changes to therapy.  5. Paroxysmal atrial fibrillation: No longer on amiodarone due to concerns for early pulmonary toxicity. Not an anticoagulation candidate.   Disposition: Follow up 6 months   Prentice Docker, M.D., F.A.C.C.

## 2018-02-02 NOTE — Patient Instructions (Signed)
Medication Instructions:  Your physician recommends that you continue on your current medications as directed. Please refer to the Current Medication list given to you today.  If you need a refill on your cardiac medications before your next appointment, please call your pharmacy.   Lab work: None today If you have labs (blood work) drawn today and your tests are completely normal, you will receive your results only by: Marland Kitchen MyChart Message (if you have MyChart) OR . A paper copy in the mail If you have any lab test that is abnormal or we need to change your treatment, we will call you to review the results.  Testing/Procedures: None today  Follow-Up: At [facility], you and your health needs are our priority.  As part of our continuing mission to provide you with exceptional heart care, we have created designated Provider Care Teams.  These Care Teams include your primary Cardiologist (physician) and Advanced Practice Providers (APPs -  Physician Assistants and Nurse Practitioners) who all work together to provide you with the care you need, when you need it. You will need a follow up appointment in 6 months.  Please call our office 2 months in advance to schedule this appointment.  You may see Dr.Koneswaran or one of the following Advanced Practice Providers on your designated Care Team:   [place], PA-C [facility] Office) . Jacolyn Reedy, PA-C [facility] Office)  Any Other Special Instructions Will Be Listed Below (If Applicable). None

## 2018-02-04 DIAGNOSIS — B351 Tinea unguium: Secondary | ICD-10-CM | POA: Diagnosis not present

## 2018-02-04 DIAGNOSIS — M79674 Pain in right toe(s): Secondary | ICD-10-CM | POA: Diagnosis not present

## 2018-02-04 DIAGNOSIS — M79675 Pain in left toe(s): Secondary | ICD-10-CM | POA: Diagnosis not present

## 2018-02-04 DIAGNOSIS — I739 Peripheral vascular disease, unspecified: Secondary | ICD-10-CM | POA: Diagnosis not present

## 2018-02-12 ENCOUNTER — Ambulatory Visit: Payer: Medicare Other | Admitting: Nurse Practitioner

## 2018-02-23 ENCOUNTER — Ambulatory Visit: Payer: Medicare Other

## 2018-02-24 ENCOUNTER — Other Ambulatory Visit: Payer: Self-pay | Admitting: Internal Medicine

## 2018-02-25 ENCOUNTER — Ambulatory Visit (INDEPENDENT_AMBULATORY_CARE_PROVIDER_SITE_OTHER): Payer: Medicare Other

## 2018-02-25 DIAGNOSIS — I442 Atrioventricular block, complete: Secondary | ICD-10-CM

## 2018-02-26 ENCOUNTER — Ambulatory Visit (INDEPENDENT_AMBULATORY_CARE_PROVIDER_SITE_OTHER): Payer: Medicare Other | Admitting: Internal Medicine

## 2018-02-26 ENCOUNTER — Encounter: Payer: Self-pay | Admitting: Internal Medicine

## 2018-02-26 VITALS — BP 120/50 | HR 44 | Ht 63.0 in | Wt 151.0 lb

## 2018-02-26 DIAGNOSIS — I25118 Atherosclerotic heart disease of native coronary artery with other forms of angina pectoris: Secondary | ICD-10-CM

## 2018-02-26 DIAGNOSIS — I5032 Chronic diastolic (congestive) heart failure: Secondary | ICD-10-CM

## 2018-02-26 DIAGNOSIS — I480 Paroxysmal atrial fibrillation: Secondary | ICD-10-CM

## 2018-02-26 DIAGNOSIS — I38 Endocarditis, valve unspecified: Secondary | ICD-10-CM | POA: Diagnosis not present

## 2018-02-26 DIAGNOSIS — R001 Bradycardia, unspecified: Secondary | ICD-10-CM | POA: Diagnosis not present

## 2018-02-26 LAB — CUP PACEART INCLINIC DEVICE CHECK
Battery Impedance: 135 Ohm
Battery Remaining Longevity: 123 mo
Battery Voltage: 2.78 V
Brady Statistic AP VP Percent: 60 %
Brady Statistic AS VS Percent: 4 %
Date Time Interrogation Session: 20200206125335
Implantable Lead Implant Date: 20060926
Implantable Lead Location: 753859
Implantable Lead Location: 753860
Implantable Lead Model: 5076
Implantable Pulse Generator Implant Date: 20180713
Lead Channel Impedance Value: 452 Ohm
Lead Channel Pacing Threshold Pulse Width: 0.4 ms
Lead Channel Pacing Threshold Pulse Width: 0.4 ms
Lead Channel Pacing Threshold Pulse Width: 0.4 ms
Lead Channel Setting Pacing Amplitude: 2.5 V
Lead Channel Setting Sensing Sensitivity: 5.6 mV
MDC IDC LEAD IMPLANT DT: 20060926
MDC IDC MSMT LEADCHNL RA IMPEDANCE VALUE: 450 Ohm
MDC IDC MSMT LEADCHNL RA PACING THRESHOLD AMPLITUDE: 0.5 V
MDC IDC MSMT LEADCHNL RA PACING THRESHOLD AMPLITUDE: 1 V
MDC IDC MSMT LEADCHNL RA SENSING INTR AMPL: 1.4 mV
MDC IDC MSMT LEADCHNL RV PACING THRESHOLD AMPLITUDE: 0.75 V
MDC IDC MSMT LEADCHNL RV PACING THRESHOLD AMPLITUDE: 0.875 V
MDC IDC MSMT LEADCHNL RV PACING THRESHOLD PULSEWIDTH: 0.4 ms
MDC IDC MSMT LEADCHNL RV SENSING INTR AMPL: 15.67 mV
MDC IDC SET LEADCHNL RA PACING AMPLITUDE: 1.5 V
MDC IDC SET LEADCHNL RV PACING PULSEWIDTH: 0.4 ms
MDC IDC STAT BRADY AP VS PERCENT: 3 %
MDC IDC STAT BRADY AS VP PERCENT: 33 %

## 2018-02-26 NOTE — Progress Notes (Signed)
HPI [name] returns today for ongoing evaluation and management of her permanent pacemaker in the setting of complete heart block, in the setting of chronic diastolic heart failure. In the interim she hasbeen treated with some oral lasix and her sob has improved. She has severe pulmonary HTN and valvular heart disease and is not a candidate for valve replacement. No Known Allergies   Current Outpatient Medications  Medication Sig Dispense Refill  . acetaminophen (TYLENOL) 500 MG tablet Take 500 mg by mouth at bedtime.    Marland Kitchen amLODipine (NORVASC) 5 MG tablet Take 1 tablet (5 mg total) by mouth daily. 90 tablet 3  . ammonium lactate (LAC-HYDRIN) 12 % lotion Apply 1 application topically daily as needed.  0  . ferrous sulfate 325 (65 FE) MG EC tablet Take 325 mg by mouth daily.     . fluticasone (FLONASE) 50 MCG/ACT nasal spray Place 2 sprays into both nostrils daily.    . furosemide (LASIX) 40 MG tablet TAKE 1 TABLET(40 MG) BY MOUTH TWICE DAILY 60 tablet 6  . lisinopril (PRINIVIL,ZESTRIL) 40 MG tablet Take 1 tablet (40 mg total) by mouth daily. 90 tablet 0  . loratadine (CLARITIN) 10 MG tablet Take 1 tablet by mouth daily  0  . pantoprazole (PROTONIX) 40 MG tablet Take 1 tablet (40 mg total) by mouth 2 (two) times daily.    . potassium chloride SA (K-DUR,KLOR-CON) 20 MEQ tablet Take 1 tablet (20 mEq total) by mouth daily. 90 tablet 2   No current facility-administered medications for this visit.      Past Medical History:  Diagnosis Date  . Anemia   . Anxiety   . Atrial fibrillation (HCC)   . DJD (degenerative joint disease)   . Gastritis   . GI bleed   . Hypertension   . Osteoporosis   . Seasonal allergies   . Sleep apnea     ROS:   All systems reviewed and negative except as noted in the HPI.   Past Surgical History:  Procedure Laterality Date  . CHOLECYSTECTOMY    . ESOPHAGOGASTRODUODENOSCOPY N/A 05/16/2015   Procedure: ESOPHAGOGASTRODUODENOSCOPY (EGD);   Surgeon: West Bali, MD;  Location: [facility];  Service: Endoscopy;  Laterality: N/A;  . ESOPHAGOGASTRODUODENOSCOPY (EGD) WITH PROPOFOL N/A 12/04/2017   Procedure: ESOPHAGOGASTRODUODENOSCOPY (EGD) WITH PROPOFOL;  Surgeon: Corbin Ade, MD;  Location: [facility];  Service: Endoscopy;  Laterality: N/A;  . HEMORRHOID SURGERY    . PACEMAKER INSERTION    . PPM GENERATOR CHANGEOUT N/A 08/02/2016   Procedure: PPM Generator Changeout;  Surgeon: Marinus Maw, MD;  [facility]: [facility] INVASIVE CV LAB;  Service: Cardiovascular;  Laterality: N/A;  . TOTAL HIP ARTHROPLASTY       Family History  Problem Relation Age of Onset  . Colon cancer Father   . Colon cancer Paternal Aunt   . Crohn's disease Son      Social History   Socioeconomic History  . Marital status: Widowed    Spouse name: Not on file  . Number of children: Not on file  . Years of education: Not on file  . Highest education level: Not on file  Occupational History  . Not on file  Social Needs  . Financial resource strain: Not on file  . Food insecurity:    Worry: Not on file    Inability: Not on file  . Transportation needs:    Medical: Not on file    Non-medical: Not  on file  Tobacco Use  . Smoking status: Never Smoker  . Smokeless tobacco: Never Used  Substance and Sexual Activity  . Alcohol use: No    Alcohol/week: 0.0 standard drinks  . Drug use: No  . Sexual activity: Not on file  Lifestyle  . Physical activity:    Days per week: Not on file    Minutes per session: Not on file  . Stress: Not on file  Relationships  . Social connections:    Talks on phone: Not on file    Gets together: Not on file    Attends religious service: Not on file    Active member of club or organization: Not on file    Attends meetings of clubs or organizations: Not on file    Relationship status: Not on file  . Intimate partner violence:    Fear of current or ex partner: Not on file    Emotionally abused: Not on file      Physically abused: Not on file    Forced sexual activity: Not on file  Other Topics Concern  . Not on file  Social History Narrative   Widowed   Daughter     BP (!) 120/50   Pulse (!) 44   Ht 5\' 3"  (1.6 m)   Wt 151 lb (68.5 kg)   SpO2 99%   BMI 26.75 kg/m   Physical Exam:  Well appearing NAD HEENT: Unremarkable Neck:  No JVD, no thyromegally Lymphatics:  No adenopathy Back:  No CVA tenderness Lungs:  Clear with no wheezes HEART:  Regular rate rhythm, 3/6 systolic murmurs, no rubs, no clicks Abd:  soft, positive bowel sounds, no organomegally, no rebound, no guarding Ext:  2 plus pulses, no edema, no cyanosis, no clubbing Skin:  No rashes no nodules Neuro:  CN II through XII intact, motor grossly intact   DEVICE  Normal device function.  See PaceArt for details.   Assess/Plan: 1. Atrial fib - her VR is well controlled. She is not a candidate for systemic anti-coagulation. 2. Chronic diastolic heart failure - her symptoms are class 2B. She is instructed to take an extra lasix if her weight goes up.  3. PPM - her medtronic DDD PM is working normally.   [name]

## 2018-02-26 NOTE — Patient Instructions (Signed)
Medication Instructions:  Your physician recommends that you continue on your current medications as directed. Please refer to the Current Medication list given to you today.  You may take an extra Lasix for weight gain of 3 lbs overnight  If you need a refill on your cardiac medications before your next appointment, please call your pharmacy.   Lab work: NONE  If you have labs (blood work) drawn today and your tests are completely normal, you will receive your results only by: Marland Kitchen MyChart Message (if you have MyChart) OR . A paper copy in the mail If you have any lab test that is abnormal or we need to change your treatment, we will call you to review the results.  Testing/Procedures: NONE   Follow-Up: At [facility], you and your health needs are our priority.  As part of our continuing mission to provide you with exceptional heart care, we have created designated Provider Care Teams.  These Care Teams include your primary Cardiologist (physician) and Advanced Practice Providers (APPs -  Physician Assistants and Nurse Practitioners) who all work together to provide you with the care you need, when you need it. You will need a follow up appointment in 1 years.  Please call our office 2 months in advance to schedule this appointment.  You may see Lewayne Bunting, MD or one of the following Advanced Practice Providers on your designated Care Team:   Gypsy Balsam, NP . Francis Dowse, PA-C  Any Other Special Instructions Will Be Listed Below (If Applicable). Thank you for choosing [facility]!

## 2018-02-27 LAB — CUP PACEART REMOTE DEVICE CHECK
Battery Voltage: 2.78 V
Brady Statistic AP VS Percent: 3 %
Brady Statistic AS VP Percent: 33 %
Brady Statistic AS VS Percent: 4 %
Date Time Interrogation Session: 20200205152706
Implantable Lead Implant Date: 20060926
Implantable Lead Location: 753859
Implantable Lead Model: 5076
Implantable Lead Model: 5076
Lead Channel Pacing Threshold Amplitude: 0.5 V
Lead Channel Pacing Threshold Amplitude: 0.875 V
Lead Channel Pacing Threshold Pulse Width: 0.4 ms
Lead Channel Pacing Threshold Pulse Width: 0.4 ms
Lead Channel Setting Pacing Amplitude: 2.5 V
Lead Channel Setting Pacing Pulse Width: 0.4 ms
MDC IDC LEAD IMPLANT DT: 20060926
MDC IDC LEAD LOCATION: 753860
MDC IDC MSMT BATTERY IMPEDANCE: 135 Ohm
MDC IDC MSMT BATTERY REMAINING LONGEVITY: 123 mo
MDC IDC MSMT LEADCHNL RA IMPEDANCE VALUE: 443 Ohm
MDC IDC MSMT LEADCHNL RV IMPEDANCE VALUE: 443 Ohm
MDC IDC PG IMPLANT DT: 20180713
MDC IDC SET LEADCHNL RA PACING AMPLITUDE: 1.5 V
MDC IDC SET LEADCHNL RV SENSING SENSITIVITY: 5.6 mV
MDC IDC STAT BRADY AP VP PERCENT: 60 %

## 2018-03-06 NOTE — Progress Notes (Signed)
Remote pacemaker transmission.   

## 2018-03-29 ENCOUNTER — Other Ambulatory Visit: Payer: Self-pay

## 2018-03-29 ENCOUNTER — Encounter (HOSPITAL_COMMUNITY): Payer: Self-pay | Admitting: Emergency Medicine

## 2018-03-29 ENCOUNTER — Emergency Department (HOSPITAL_COMMUNITY)
Admission: EM | Admit: 2018-03-29 | Discharge: 2018-03-29 | Disposition: A | Discharge status: Home / Self Care | Payer: Medicare Other | Attending: Emergency Medicine | Admitting: Emergency Medicine

## 2018-03-29 ENCOUNTER — Emergency Department (HOSPITAL_COMMUNITY): Payer: Medicare Other

## 2018-03-29 DIAGNOSIS — Z96649 Presence of unspecified artificial hip joint: Secondary | ICD-10-CM | POA: Diagnosis not present

## 2018-03-29 DIAGNOSIS — I509 Heart failure, unspecified: Secondary | ICD-10-CM | POA: Diagnosis not present

## 2018-03-29 DIAGNOSIS — I2510 Atherosclerotic heart disease of native coronary artery without angina pectoris: Secondary | ICD-10-CM | POA: Insufficient documentation

## 2018-03-29 DIAGNOSIS — R059 Cough, unspecified: Secondary | ICD-10-CM

## 2018-03-29 DIAGNOSIS — I110 Hypertensive heart disease with heart failure: Secondary | ICD-10-CM | POA: Insufficient documentation

## 2018-03-29 DIAGNOSIS — R05 Cough: Secondary | ICD-10-CM | POA: Diagnosis not present

## 2018-03-29 DIAGNOSIS — Z950 Presence of cardiac pacemaker: Secondary | ICD-10-CM | POA: Diagnosis not present

## 2018-03-29 DIAGNOSIS — Z79899 Other long term (current) drug therapy: Secondary | ICD-10-CM | POA: Insufficient documentation

## 2018-03-29 MED ORDER — DOXYCYCLINE HYCLATE 100 MG PO TABS
100.0000 mg | ORAL_TABLET | Freq: Once | ORAL | Status: AC
  Administered 2018-03-29: 100 mg via ORAL
  Filled 2018-03-29: qty 1

## 2018-03-29 MED ORDER — DOXYCYCLINE HYCLATE 100 MG PO CAPS: 100.0000 mg | ORAL_CAPSULE | Freq: Two times a day (BID) | ORAL | 0 refills | Status: AC

## 2018-03-29 NOTE — Discharge Instructions (Signed)
Your testing today shows that you have a normal x-ray with no signs of infection, no signs of pneumonia however given your increase in coughing and the sound of your lungs when I listen to them I would like for you to take doxycycline, 1 tablet twice a day for the next 7 days.  You may use over-the-counter cough medicine as needed and follow-up with your doctor in 48 hours but please come back to the emergency department for any severe or worsening symptoms.

## 2018-03-29 NOTE — ED Provider Notes (Signed)
[facility] EMERGENCY DEPARTMENT Provider Note   CSN: [id number] Arrival date & time: 03/29/18  1624    History   Chief Complaint Chief Complaint  Patient presents with  . Cough    HPI [name] is a 85 y.o. female.     HPI  The patient is a pleasant 85 year old female, she has a known history of atrial fibrillation as well as placement of a pacemaker in the past, she has known congestive heart failure and is known to have a systolic murmur per the medical record.  Last echocardiogram was performed on July 03, 2017.  The patient had an ejection fraction of 65 to 70%, there was seen to be calcified with moderate to severe stenosis  She had a normal pacemaker check in the clinic, this was performed on February 26, 2018.  10 years longevity left on battery.  She presents complaining of a cough which started 3 days ago, she has no other symptoms including no edema, she does not feel short of breath, she is not having fevers or sore throat or headache.  The cough is both day and night, nothing seems to make it better or worse, she has not been seen by her family doctor, Dr. Felecia Shelling and has not been seen by her cardiologist.  She has not been taking any medications for this cough.  She feels like it is only occasionally productive but mostly dry.  She is accompanied by family member who corroborates the patient's story.  Past Medical History:  Diagnosis Date  . Anemia   . Anxiety   . Atrial fibrillation (HCC)   . DJD (degenerative joint disease)   . Gastritis   . GI bleed   . Hypertension   . Osteoporosis   . Seasonal allergies   . Sleep apnea     Patient Active Problem List   Diagnosis Date Noted  . Cameron lesion, acute   . Gastric erosion   . Generalized weakness 12/03/2017  . Heme positive stool 12/03/2017  . Acute blood loss anemia 12/03/2017  . GERD (gastroesophageal reflux disease) 05/07/2017  . Hiatal hernia 05/07/2017  . Dysphagia 02/27/2017  . Malnutrition of  moderate degree 06/03/2015  . Hospital acquired PNA 05/23/2015  . HCAP (healthcare-associated pneumonia) 05/23/2015  . Palliative care encounter   . DNR (do not resuscitate) discussion   . Acute GI bleeding   . Severe anemia 05/14/2015  . Melena 05/14/2015  . GI bleed 05/14/2015  . Atrial fibrillation (HCC) 04/26/2015  . Severe tricuspid regurgitation 03/15/2015  . CHF (congestive heart failure) (HCC) 03/08/2015  . Systolic murmur 03/08/2015  . CAD (coronary artery disease) 05/25/2014  . DYSLIPIDEMIA 03/14/2010  . Essential hypertension, benign 03/20/2009  . Bradycardia 03/20/2009  . Cardiac pacemaker in situ 03/20/2009    Past Surgical History:  Procedure Laterality Date  . CHOLECYSTECTOMY    . ESOPHAGOGASTRODUODENOSCOPY N/A 05/16/2015   Procedure: ESOPHAGOGASTRODUODENOSCOPY (EGD);  Surgeon: West Bali, MD;  Location: [facility];  Service: Endoscopy;  Laterality: N/A;  . ESOPHAGOGASTRODUODENOSCOPY (EGD) WITH PROPOFOL N/A 12/04/2017   Procedure: ESOPHAGOGASTRODUODENOSCOPY (EGD) WITH PROPOFOL;  Surgeon: Corbin Ade, MD;  Location: [facility];  Service: Endoscopy;  Laterality: N/A;  . HEMORRHOID SURGERY    . PACEMAKER INSERTION    . PPM GENERATOR CHANGEOUT N/A 08/02/2016   Procedure: PPM Generator Changeout;  Surgeon: Marinus Maw, MD;  Location: [facility] INVASIVE CV LAB;  Service: Cardiovascular;  Laterality: N/A;  . TOTAL HIP ARTHROPLASTY  OB History   No obstetric history on file.      Home Medications    Prior to Admission medications   Medication Sig Start Date End Date Taking? Authorizing Provider  acetaminophen (TYLENOL) 500 MG tablet Take 500 mg by mouth at bedtime.    [provider]  amLODipine (NORVASC) 5 MG tablet Take 1 tablet (5 mg total) by mouth daily. 05/25/14   Laqueta Linden, MD  ammonium lactate (LAC-HYDRIN) 12 % lotion Apply 1 application topically daily as needed. 11/24/17   [provider]  doxycycline (VIBRAMYCIN)  100 MG capsule Take 1 capsule (100 mg total) by mouth 2 (two) times daily for 7 days. 03/29/18 04/05/18  Eber Hong, MD  ferrous sulfate 325 (65 FE) MG EC tablet Take 325 mg by mouth daily.     [provider]  fluticasone (FLONASE) 50 MCG/ACT nasal spray Place 2 sprays into both nostrils daily.    [provider]  furosemide (LASIX) 40 MG tablet TAKE 1 TABLET(40 MG) BY MOUTH TWICE DAILY 02/24/18   Johnson, Clanford L, MD  lisinopril (PRINIVIL,ZESTRIL) 40 MG tablet Take 1 tablet (40 mg total) by mouth daily. 12/08/17   Johnson, Clanford L, MD  loratadine (CLARITIN) 10 MG tablet Take 1 tablet by mouth daily 04/09/16   [provider]  pantoprazole (PROTONIX) 40 MG tablet Take 1 tablet (40 mg total) by mouth 2 (two) times daily. 12/06/17   Johnson, Clanford L, MD  potassium chloride SA (K-DUR,KLOR-CON) 20 MEQ tablet Take 1 tablet (20 mEq total) by mouth daily. 12/06/17   Cleora Fleet, MD    Family History Family History  Problem Relation Age of Onset  . Colon cancer Father   . Colon cancer Paternal Aunt   . Crohn's disease Son     Social History Social History   Tobacco Use  . Smoking status: Never Smoker  . Smokeless tobacco: Never Used  Substance Use Topics  . Alcohol use: No    Alcohol/week: 0.0 standard drinks  . Drug use: No     Allergies   Patient has no known allergies.   Review of Systems Review of Systems  All other systems reviewed and are negative.    Physical Exam Updated Vital Signs BP 140/63 (BP Location: Right Arm)   Pulse 66   Temp 98.1 F (36.7 C) (Oral)   Resp 16   Ht 1.6 m (5\' 3" )   Wt 66.2 kg   SpO2 98%   BMI 25.86 kg/m   Physical Exam Vitals signs and nursing note reviewed.  Constitutional:      General: She is not in acute distress.    Appearance: She is well-developed.  HENT:     Head: Normocephalic and atraumatic.     Mouth/Throat:     Pharynx: No oropharyngeal exudate.  Eyes:     General: No scleral  icterus.       Right eye: No discharge.        Left eye: No discharge.     Conjunctiva/sclera: Conjunctivae normal.     Pupils: Pupils are equal, round, and reactive to light.  Neck:     Musculoskeletal: Normal range of motion and neck supple.     Thyroid: No thyromegaly.     Vascular: No JVD.  Cardiovascular:     Rate and Rhythm: Normal rate and regular rhythm.     Heart sounds: Murmur ( Loud systolic murmur) present. No friction rub. No gallop.   Pulmonary:  Effort: Pulmonary effort is normal. No respiratory distress.     Breath sounds: Rhonchi ( Rhonchi with deep breathing, no wheezing, no rales, speaks in full sentences) present. No wheezing or rales.     Comments: No tachypnea, no distress, no accessory muscle use Abdominal:     General: Bowel sounds are normal. There is no distension.     Palpations: Abdomen is soft. There is no mass.     Tenderness: There is no abdominal tenderness.  Musculoskeletal: Normal range of motion.        General: No tenderness.  Lymphadenopathy:     Cervical: No cervical adenopathy.  Skin:    General: Skin is warm and dry.     Findings: No erythema or rash.  Neurological:     Mental Status: She is alert.     Coordination: Coordination normal.  Psychiatric:        Behavior: Behavior normal.      ED Treatments / Results  Labs (all labs ordered are listed, but only abnormal results are displayed) Labs Reviewed - No data to display  EKG None  Radiology Dg Chest 2 View  Result Date: 03/29/2018 CLINICAL DATA:  Cough EXAM: CHEST - 2 VIEW COMPARISON:  12/03/2017 chest radiograph. FINDINGS: Stable configuration of 2 lead left subclavian pacemaker. Stable cardiomediastinal silhouette with marked cardiomegaly and large hiatal hernia. No pneumothorax. No pleural effusion. No pulmonary edema. Stable compressive atelectasis at the medial lung bases. No acute consolidative airspace disease. IMPRESSION: 1. No acute cardiopulmonary disease. 2. Stable  marked cardiomegaly without pulmonary edema. 3. Stable compressive atelectasis at the medial lung bases related to large hiatal hernia. Electronically Signed   By: Delbert Phenix M.D.   On: 03/29/2018 18:17    Procedures Procedures (including critical care time)  Medications Ordered in ED Medications  doxycycline (VIBRA-TABS) tablet 100 mg (has no administration in time range)     Initial Impression / Assessment and Plan / ED Course  I have reviewed the triage vital signs and the nursing notes.  Pertinent labs & imaging results that were available during my care of the patient were reviewed by me and considered in my medical decision making (see chart for details).  Clinical Course as of Mar 29 1843  Wynelle Link Mar 29, 2018  1842 No significant pulmonary edema or pneumonia, the patient will be treated for possible early bronchitis, given the rhonchi and persistent cough will try doxycycline, patient stable for discharge   [BM]    Clinical Course User Index [BM] Eber Hong, MD       The patient does not appear to be in distress, her cardiac monitoring shows that she is in atrial fibrillation with a controlled rate, she is not hypoxic, she does have rhonchi but no wheezing or rales, x-ray has been performed.  Final Clinical Impressions(s) / ED Diagnoses   Final diagnoses:  Cough    ED Discharge Orders         Ordered    doxycycline (VIBRAMYCIN) 100 MG capsule  2 times daily     03/29/18 1843           Eber Hong, MD 03/29/18 1845

## 2018-03-29 NOTE — ED Triage Notes (Signed)
Patient c/o constant cough since Friday. Per patient unable to get sputum up. Denies any fevers, body aches, nausea, or vomiting.Family states "this is not like a cold cough, family states "patient has had similar cough in past in which she had aspiration pneumonia".

## 2018-04-06 DIAGNOSIS — I5032 Chronic diastolic (congestive) heart failure: Secondary | ICD-10-CM | POA: Diagnosis not present

## 2018-04-06 DIAGNOSIS — I480 Paroxysmal atrial fibrillation: Secondary | ICD-10-CM | POA: Diagnosis not present

## 2018-04-06 DIAGNOSIS — I2510 Atherosclerotic heart disease of native coronary artery without angina pectoris: Secondary | ICD-10-CM | POA: Diagnosis not present

## 2018-04-06 DIAGNOSIS — I10 Essential (primary) hypertension: Secondary | ICD-10-CM | POA: Diagnosis not present

## 2018-06-03 ENCOUNTER — Other Ambulatory Visit: Payer: Self-pay

## 2018-06-03 ENCOUNTER — Ambulatory Visit (INDEPENDENT_AMBULATORY_CARE_PROVIDER_SITE_OTHER): Payer: Medicare Other | Admitting: *Deleted

## 2018-06-03 DIAGNOSIS — I480 Paroxysmal atrial fibrillation: Secondary | ICD-10-CM

## 2018-06-03 DIAGNOSIS — I5032 Chronic diastolic (congestive) heart failure: Secondary | ICD-10-CM | POA: Diagnosis not present

## 2018-06-03 DIAGNOSIS — I38 Endocarditis, valve unspecified: Secondary | ICD-10-CM | POA: Diagnosis not present

## 2018-06-04 LAB — CUP PACEART REMOTE DEVICE CHECK
Battery Impedance: 135 Ohm
Battery Remaining Longevity: 123 mo
Battery Voltage: 2.78 V
Brady Statistic AP VP Percent: 38 %
Brady Statistic AP VS Percent: 2 %
Brady Statistic AS VP Percent: 54 %
Brady Statistic AS VS Percent: 5 %
Date Time Interrogation Session: 20200513190755
Implantable Lead Implant Date: 20060926
Implantable Lead Implant Date: 20060926
Implantable Lead Location: 753859
Implantable Lead Location: 753860
Implantable Lead Model: 5076
Implantable Lead Model: 5076
Implantable Pulse Generator Implant Date: 20180713
Lead Channel Impedance Value: 443 Ohm
Lead Channel Impedance Value: 455 Ohm
Lead Channel Pacing Threshold Amplitude: 0.5 V
Lead Channel Pacing Threshold Amplitude: 0.625 V
Lead Channel Pacing Threshold Pulse Width: 0.4 ms
Lead Channel Pacing Threshold Pulse Width: 0.4 ms
Lead Channel Sensing Intrinsic Amplitude: 1.4 mV
Lead Channel Setting Pacing Amplitude: 1.5 V
Lead Channel Setting Pacing Amplitude: 2.5 V
Lead Channel Setting Pacing Pulse Width: 0.4 ms
Lead Channel Setting Sensing Sensitivity: 5.6 mV

## 2018-06-19 NOTE — Progress Notes (Signed)
Remote pacemaker transmission.   

## 2018-06-30 ENCOUNTER — Telehealth: Payer: Self-pay | Admitting: Cardiovascular Disease

## 2018-06-30 NOTE — Telephone Encounter (Signed)
Per phone call from pt's daughter, pt is not feeling well. Please call Lorriane Shire @ [phone]

## 2018-06-30 NOTE — Telephone Encounter (Signed)
Returned call to [name]. No answer, left msg to call back.

## 2018-07-01 DIAGNOSIS — I5032 Chronic diastolic (congestive) heart failure: Secondary | ICD-10-CM | POA: Diagnosis not present

## 2018-07-01 DIAGNOSIS — I501 Left ventricular failure: Secondary | ICD-10-CM | POA: Diagnosis not present

## 2018-07-01 DIAGNOSIS — I10 Essential (primary) hypertension: Secondary | ICD-10-CM | POA: Diagnosis not present

## 2018-07-01 DIAGNOSIS — R32 Unspecified urinary incontinence: Secondary | ICD-10-CM | POA: Diagnosis not present

## 2018-07-01 DIAGNOSIS — I2510 Atherosclerotic heart disease of native coronary artery without angina pectoris: Secondary | ICD-10-CM | POA: Diagnosis not present

## 2018-07-01 DIAGNOSIS — I480 Paroxysmal atrial fibrillation: Secondary | ICD-10-CM | POA: Diagnosis not present

## 2018-07-01 NOTE — Telephone Encounter (Signed)
Returned call. No answer. Left message for  pt to return call.  

## 2018-07-02 NOTE — Telephone Encounter (Signed)
Spoke with Lorriane Shire (pt's daughter) She states that she took her mother to see her pcp. She is waiting for lab results to come back. She would like to keep her mother's appointment scheduled for June.

## 2018-07-21 ENCOUNTER — Ambulatory Visit: Payer: Medicare Other | Admitting: Cardiovascular Disease

## 2018-07-23 ENCOUNTER — Ambulatory Visit (INDEPENDENT_AMBULATORY_CARE_PROVIDER_SITE_OTHER): Payer: Medicare Other | Admitting: Student

## 2018-07-23 ENCOUNTER — Encounter: Payer: Self-pay | Admitting: Student

## 2018-07-23 VITALS — BP 137/66 | HR 56 | Temp 98.3°F | Ht 63.0 in | Wt 149.0 lb

## 2018-07-23 DIAGNOSIS — I480 Paroxysmal atrial fibrillation: Secondary | ICD-10-CM

## 2018-07-23 DIAGNOSIS — I10 Essential (primary) hypertension: Secondary | ICD-10-CM

## 2018-07-23 DIAGNOSIS — I38 Endocarditis, valve unspecified: Secondary | ICD-10-CM

## 2018-07-23 DIAGNOSIS — I5032 Chronic diastolic (congestive) heart failure: Secondary | ICD-10-CM | POA: Diagnosis not present

## 2018-07-23 DIAGNOSIS — Z950 Presence of cardiac pacemaker: Secondary | ICD-10-CM

## 2018-07-23 DIAGNOSIS — I350 Nonrheumatic aortic (valve) stenosis: Secondary | ICD-10-CM | POA: Diagnosis not present

## 2018-07-23 DIAGNOSIS — I25118 Atherosclerotic heart disease of native coronary artery with other forms of angina pectoris: Secondary | ICD-10-CM

## 2018-07-23 DIAGNOSIS — R0609 Other forms of dyspnea: Secondary | ICD-10-CM | POA: Diagnosis not present

## 2018-07-23 DIAGNOSIS — D649 Anemia, unspecified: Secondary | ICD-10-CM | POA: Diagnosis not present

## 2018-07-23 NOTE — Patient Instructions (Signed)
Medication Instructions:  Your physician recommends that you continue on your current medications as directed. Please refer to the Current Medication list given to you today.   Labwork: I will request labs from pcp  Testing/Procedures: Your physician has requested that you have an echocardiogram. Echocardiography is a painless test that uses sound waves to create images of your heart. It provides your doctor with information about the size and shape of your heart and how well your heart's chambers and valves are working. This procedure takes approximately one hour. There are no restrictions for this procedure.    Follow-Up: Your physician recommends that you schedule a follow-up appointment in: 3 months    Any Other Special Instructions Will Be Listed Below (If Applicable).     If you need a refill on your cardiac medications before your next appointment, please call your pharmacy.

## 2018-07-23 NOTE — Progress Notes (Addendum)
Cardiology Office Note    Date:  07/23/2018   ID:  [name], DOB 1920/01/29, MRN [id number]  PCP:  Avon Gully, MD  Cardiologist: Prentice Docker, MD   EP: Dr. [name]  Chief Complaint  Patient presents with   Follow-up    Worsening Fatigue and Dyspnea on Exertion    History of Present Illness:    [name] is a 85 y.o. female with past medical history of chronic diastolic CHF, CAD, SSS (s/p PPM placement with most recent gen change in 07/2016), moderate to severe AS, PAF (not on anticoagulation given advanced age and recurrent GIB), HTN, and HLD who presents to the office today for 44-month follow-up.  She was last examined by Dr. Purvis Sheffield in 01/2018 and had been hospitalized a few months prior for a GIB and EGD showed gastric erosions and she required 2 units pRBC's. Reported having baseline dyspnea on exertion but denied any recent change in her symptoms. She was continued on Lasix 40 mg twice daily and it was felt that she would be a suboptimal candidate for valve replacement given that she was not an ideal candidate for antiplatelet therapy and in the setting of her advanced age.  In talking with the patient and her daughter today she reports a significant decline in her energy level and worsening dyspnea on exertion for the past 2-3 months. She lives by herself but the patient's son and daughters alternate staying with her during the day and at night. She was previously able to walk around the home without issues but now gets short of breath when walking from room to room. Denies any associated chest pain or palpitations. No specific orthopnea, PND, or edema. Weight has been stable on her home scales.   Her daughter manages her medications and says she takes Lasix as prescribed at 40mg  BID. She does ambulate with a cane around the house and with a walker when out and about. Has mostly been staying at home recently given the COVID-19 pandemic. She had recent labs checked by  her PCP and they report these were stable. Hgb was ~ 10.0 and will request the records. She denies any recurrent melena or hematochezia.    Past Medical History:  Diagnosis Date   Anemia    Anxiety    Aortic stenosis    Atrial fibrillation (HCC)    DJD (degenerative joint disease)    Gastritis    GI bleed    Hypertension    Osteoporosis    Seasonal allergies    Sleep apnea     Past Surgical History:  Procedure Laterality Date   CHOLECYSTECTOMY     ESOPHAGOGASTRODUODENOSCOPY N/A 05/16/2015   Procedure: ESOPHAGOGASTRODUODENOSCOPY (EGD);  Surgeon: West Bali, MD;  Location: [facility];  Service: Endoscopy;  Laterality: N/A;   ESOPHAGOGASTRODUODENOSCOPY (EGD) WITH PROPOFOL N/A 12/04/2017   Procedure: ESOPHAGOGASTRODUODENOSCOPY (EGD) WITH PROPOFOL;  Surgeon: Corbin Ade, MD;  Location: [facility];  Service: Endoscopy;  Laterality: N/A;   HEMORRHOID SURGERY     PACEMAKER INSERTION     PPM GENERATOR CHANGEOUT N/A 08/02/2016   Procedure: PPM Generator Changeout;  Surgeon: Marinus Maw, MD;  Location: [facility];  Service: Cardiovascular;  Laterality: N/A;   TOTAL HIP ARTHROPLASTY      Current Medications: Outpatient Medications Prior to Visit  Medication Sig Dispense Refill   acetaminophen (TYLENOL) 500 MG tablet Take 500 mg by mouth at bedtime.     amLODipine (NORVASC) 5 MG  tablet Take 1 tablet (5 mg total) by mouth daily. 90 tablet 3   ammonium lactate (LAC-HYDRIN) 12 % lotion Apply 1 application topically daily as needed.  0   ferrous sulfate 325 (65 FE) MG EC tablet Take 325 mg by mouth daily.      fluticasone (FLONASE) 50 MCG/ACT nasal spray Place 2 sprays into both nostrils daily.     furosemide (LASIX) 40 MG tablet TAKE 1 TABLET(40 MG) BY MOUTH TWICE DAILY 60 tablet 6   lisinopril (PRINIVIL,ZESTRIL) 40 MG tablet Take 1 tablet (40 mg total) by mouth daily. 90 tablet 0   loratadine (CLARITIN) 10 MG tablet Take 1 tablet by mouth  daily  0   pantoprazole (PROTONIX) 40 MG tablet Take 1 tablet (40 mg total) by mouth 2 (two) times daily.     potassium chloride SA (K-DUR,KLOR-CON) 20 MEQ tablet Take 1 tablet (20 mEq total) by mouth daily. 90 tablet 2   No facility-administered medications prior to visit.      Allergies:   Patient has no known allergies.   Social History   Socioeconomic History   Marital status: Widowed    Spouse name: Not on file   Number of children: Not on file   Years of education: Not on file   Highest education level: Not on file  Occupational History   Not on file  Social Needs   Financial resource strain: Not on file   Food insecurity    Worry: Not on file    Inability: Not on file   Transportation needs    Medical: Not on file    Non-medical: Not on file  Tobacco Use   Smoking status: Never Smoker   Smokeless tobacco: Never Used  Substance and Sexual Activity   Alcohol use: No    Alcohol/week: 0.0 standard drinks   Drug use: No   Sexual activity: Not on file  Lifestyle   Physical activity    Days per week: Not on file    Minutes per session: Not on file   Stress: Not on file  Relationships   Social connections    Talks on phone: Not on file    Gets together: Not on file    Attends religious service: Not on file    Active member of club or organization: Not on file    Attends meetings of clubs or organizations: Not on file    Relationship status: Not on file  Other Topics Concern   Not on file  Social History Narrative   Widowed   Daughter     Family History:  The patient's family history includes Colon cancer in her father and paternal aunt; Crohn's disease in her son.   Review of Systems:   Please see the history of present illness.     General:  No chills, fever, night sweats or weight changes. Positive for fatigue.  Cardiovascular:  No chest pain, edema, orthopnea, palpitations, paroxysmal nocturnal dyspnea. Positive for dyspnea on  exertion.  Dermatological: No rash, lesions/masses Respiratory: No cough, dyspnea Urologic: No hematuria, dysuria Abdominal:   No nausea, vomiting, diarrhea, bright red blood per rectum, melena, or hematemesis Neurologic:  No visual changes, wkns, changes in mental status. All other systems reviewed and are otherwise negative except as noted above.   Physical Exam:    VS:  BP 137/66    Pulse (!) 56    Temp 98.3 F (36.8 C)    Ht 5\' 3"  (1.6 m)    Wt  149 lb (67.6 kg)    BMI 26.39 kg/m    General: Elderly African American female appearing in no acute distress. Head: Normocephalic, atraumatic, sclera non-icteric, no xanthomas, nares are without discharge.  Neck: No carotid bruits. JVD not elevated.  Lungs: Respirations regular and unlabored, without wheezes or rales.  Heart: Regular rate and rhythm. No S3 or S4.  No rubs or gallops appreciated. 3/6 SEM along RUSB.  Abdomen: Soft, non-tender, non-distended with normoactive bowel sounds. No hepatomegaly. No rebound/guarding. No obvious abdominal masses. Msk:  Strength and tone appear normal for age. No joint deformities or effusions. Extremities: No clubbing or cyanosis. No lower extremity edema.  Distal pedal pulses are 2+ bilaterally. Neuro: Alert and oriented X 3. Moves all extremities spontaneously. No focal deficits noted. Psych:  Responds to questions appropriately with a normal affect. Skin: No rashes or lesions noted  Wt Readings from Last 3 Encounters:  07/23/18 149 lb (67.6 kg)  03/29/18 146 lb (66.2 kg)  02/26/18 151 lb (68.5 kg)    Studies/Labs Reviewed:   EKG:  EKG is not ordered today.   Recent Labs: 11/28/2017: B Natriuretic Peptide 244.0 12/03/2017: ALT 12 12/05/2017: Magnesium 1.9 12/06/2017: BUN 31; Creatinine, Ser 1.11; Hemoglobin 9.2; Platelets 150; Potassium 3.7; Sodium 139   Lipid Panel No results found for: CHOL, TRIG, HDL, CHOLHDL, VLDL, LDLCALC, LDLDIRECT  Additional studies/ records that were reviewed  today include:   Echocardiogram: 06/2017 Study Conclusions  - Left ventricle: The cavity size was normal. Systolic function was   vigorous. The estimated ejection fraction was in the range of 65%   to 70%. Wall motion was normal; there were no regional wall   motion abnormalities. Doppler parameters are consistent with   restrictive physiology, indicative of decreased left ventricular   diastolic compliance and/or increased left atrial pressure.   Doppler parameters are consistent with high ventricular filling   pressure. Moderate focal basal septal hypertrophy. - Aortic valve: Severely calcified annulus. There was moderate to   severe stenosis. There was mild to moderate regurgitation. Peak   velocity (S): 394 cm/s. Mean gradient (S): 34 mm Hg. Valve area   (VTI): 0.61 cm^2. Valve area (Vmax): 0.83 cm^2. Valve area   (Vmean): 0.76 cm^2. - Mitral valve: Moderately calcified annulus. Normal thickness   leaflets . There was moderate regurgitation. Valve area by   pressure half-time: 1.39 cm^2. Valve area by continuity equation   (using LVOT flow): 1.21 cm^2. - Left atrium: The atrium was severely dilated. - Right ventricle: Pacer wire or catheter noted in right ventricle. - Right atrium: The atrium was massively dilated. Pacer wire or   catheter noted in right atrium. - Tricuspid valve: There was severe regurgitation. - Pulmonic valve: There was mild regurgitation. - Pulmonary arteries: Systolic pressure was severely increased. - Inferior vena cava: The vessel was dilated. The respirophasic   diameter changes were blunted (< 50%), consistent with elevated   central venous pressure. Estimated CVP 15 mmHg.  Assessment:    1. Chronic diastolic heart failure due to valvular disease (HCC)   2. DOE (dyspnea on exertion)   3. Aortic valve stenosis, etiology of cardiac valve disease unspecified   4. Essential hypertension   5. Chronic anemia   6. PAF (paroxysmal atrial fibrillation)  (HCC)   7. Cardiac pacemaker in situ      Plan:   In order of problems listed above:  1. Chronic Diastolic CHF - reports progressive dyspnea on exertion as outlined above but denies any  specific orthopnea, PND, or edema. Weight has been stable on her home scales. At 149 lbs today and she appears euvolemic by examination.  - will plan to obtain a repeat echocardiogram as outlined below. Will continue Lasix at current dosing of 40mg  BID. We reviewed she could take an extra 20mg  for weight gain greater than 3 lbs overnight or 5 lbs in one week.   2. Valvular Heart Disease - most recent echocardiogram in 06/2017 showed moderate to severe AS, mild to moderate AI, moderate MR, severe TR, and mild PR. Given her progressive dyspnea on exertion and fatigue, I reviewed these results with the patient and her daughter in detail today. Reviewed the natural progression of valvular heart disease and how this is likely contributing to her progressive symptoms. Unfortunately, they did not seem aware of the severity of this and thought she had a benign murmur. Reviewed options at this time and will plan to obtain a repeat echocardiogram for reassessment. Given her advanced age and co-morbidities, she is not sure she would want to proceed with interventions and I reviewed with them she might not be a candidate for intervention such as TAVR given her co-morbidities, age, and multiple valve issues. I encouraged the patient and her daughter to talk with other family members and discuss in the interim. Once a repeat echo is obtained, would plan for referral to the Structural Heart Team to see if she would be a candidate for any interventions.   3. HTN - BP is well-controlled at 137/66 during today's visit.  - continue current medication regimen with Amlodipine 5mg  daily and Lisinopril 40mg  daily.   4. Chronic Anemia - stable at ~ 10.0 when recently checked by her PCP per the daughter's report. Will request a copy of  her most recent labs.   5. PAF - device interrogation in 05/2018 showed a 14.5% AF burden. By review of notes she is not on anticoagulation given advanced age and recurrent GIB. Denies any recent palpitations and HR is well-controlled during today's visit.   6. SSS - s/p PPM placement with most recent gen change in 07/2016. Followed by Dr. Ladona Ridgelaylor.    Medication Adjustments/Labs and Tests Ordered: Current medicines are reviewed at length with the patient today.  Concerns regarding medicines are outlined above.  Medication changes, Labs and Tests ordered today are listed in the Patient Instructions below. Patient Instructions  Medication Instructions:  Your physician recommends that you continue on your current medications as directed. Please refer to the Current Medication list given to you today.   Labwork: I will request labs from pcp  Testing/Procedures: Your physician has requested that you have an echocardiogram. Echocardiography is a painless test that uses sound waves to create images of your heart. It provides your doctor with information about the size and shape of your heart and how well your hearts chambers and valves are working. This procedure takes approximately one hour. There are no restrictions for this procedure.  Follow-Up: Your physician recommends that you schedule a follow-up appointment in: 3 months   Any Other Special Instructions Will Be Listed Below (If Applicable).  If you need a refill on your cardiac medications before your next appointment, please call your pharmacy.    Signed, Ellsworth LennoxBrittany M Shia Delaine, PA-C  07/23/2018 5:35 PM    [facility] [address] [place], [place] [address] Phone: [phone] Fax: [phone]

## 2018-07-28 ENCOUNTER — Other Ambulatory Visit: Payer: Self-pay

## 2018-07-28 ENCOUNTER — Ambulatory Visit (HOSPITAL_COMMUNITY)
Admission: RE | Admit: 2018-07-28 | Discharge: 2018-07-28 | Disposition: A | Discharge status: Home / Self Care | Payer: Medicare Other | Source: Ambulatory Visit | Attending: Cardiovascular Disease | Admitting: Cardiovascular Disease

## 2018-07-28 DIAGNOSIS — R0609 Other forms of dyspnea: Secondary | ICD-10-CM

## 2018-07-28 NOTE — Progress Notes (Signed)
*  PRELIMINARY RESULTS* Echocardiogram 2D Echocardiogram has been performed.  Leavy Cella 07/28/2018, 3:20 PM

## 2018-07-31 ENCOUNTER — Telehealth: Payer: Self-pay

## 2018-07-31 DIAGNOSIS — I10 Essential (primary) hypertension: Secondary | ICD-10-CM | POA: Diagnosis not present

## 2018-07-31 DIAGNOSIS — I2510 Atherosclerotic heart disease of native coronary artery without angina pectoris: Secondary | ICD-10-CM | POA: Diagnosis not present

## 2018-07-31 NOTE — Telephone Encounter (Signed)
Called pt's daughter. No answer. Left message for her to return call.

## 2018-07-31 NOTE — Telephone Encounter (Signed)
-----   Message from Erma Heritage, [place] sent at 07/30/2018  7:20 AM EDT ----- Please let the patient and her daughter know the echocardiogram showed normal pumping function of the heart with a preserved EF of 60-65%. She does have significant valvular heart disease with moderate to severe aortic stenosis and severe tricuspid regurgitation. At the time of her visit I reviewed the role of the Structural Heart Team given her progressive symptoms. Would still recommend they meet with them if in agreement and review options. She might not be a candidate for surgical procedures given her age but I want them to be informed of possible options.

## 2018-08-03 ENCOUNTER — Telehealth: Payer: Self-pay | Admitting: Student

## 2018-08-03 NOTE — Telephone Encounter (Signed)
Patient's daughter would like to speak with nurse regarding "test" for patient.

## 2018-08-03 NOTE — Telephone Encounter (Signed)
[id number] 

## 2018-08-05 NOTE — Telephone Encounter (Signed)
Spoke with pt's daughter Lorriane Shire) informed her of echo results. She agrees to discuss any options available with structural heart team. I staff messaged Angelena Form, PA. With pt's information. Lorriane Shire will await to hear from her.

## 2018-08-17 DIAGNOSIS — I739 Peripheral vascular disease, unspecified: Secondary | ICD-10-CM | POA: Diagnosis not present

## 2018-08-17 DIAGNOSIS — B351 Tinea unguium: Secondary | ICD-10-CM | POA: Diagnosis not present

## 2018-08-17 DIAGNOSIS — M79674 Pain in right toe(s): Secondary | ICD-10-CM | POA: Diagnosis not present

## 2018-08-17 DIAGNOSIS — M79675 Pain in left toe(s): Secondary | ICD-10-CM | POA: Diagnosis not present

## 2018-08-31 DIAGNOSIS — I10 Essential (primary) hypertension: Secondary | ICD-10-CM | POA: Diagnosis not present

## 2018-08-31 DIAGNOSIS — I2510 Atherosclerotic heart disease of native coronary artery without angina pectoris: Secondary | ICD-10-CM | POA: Diagnosis not present

## 2018-08-31 DIAGNOSIS — M160 Bilateral primary osteoarthritis of hip: Secondary | ICD-10-CM | POA: Diagnosis not present

## 2018-09-07 ENCOUNTER — Encounter: Payer: Self-pay | Admitting: Cardiovascular Disease

## 2018-09-07 ENCOUNTER — Other Ambulatory Visit: Payer: Self-pay

## 2018-09-07 ENCOUNTER — Ambulatory Visit (INDEPENDENT_AMBULATORY_CARE_PROVIDER_SITE_OTHER): Payer: Medicare Other | Admitting: Cardiovascular Disease

## 2018-09-07 VITALS — BP 126/54 | HR 66 | Ht 63.0 in | Wt 145.0 lb

## 2018-09-07 DIAGNOSIS — I350 Nonrheumatic aortic (valve) stenosis: Secondary | ICD-10-CM

## 2018-09-07 DIAGNOSIS — I38 Endocarditis, valve unspecified: Secondary | ICD-10-CM

## 2018-09-07 DIAGNOSIS — I25118 Atherosclerotic heart disease of native coronary artery with other forms of angina pectoris: Secondary | ICD-10-CM

## 2018-09-07 DIAGNOSIS — I071 Rheumatic tricuspid insufficiency: Secondary | ICD-10-CM | POA: Diagnosis not present

## 2018-09-07 DIAGNOSIS — I5032 Chronic diastolic (congestive) heart failure: Secondary | ICD-10-CM | POA: Diagnosis not present

## 2018-09-07 NOTE — Progress Notes (Signed)
Cardiology Office Note:    Date:  09/07/2018   ID:  [name], DOB 1920-03-13, MRN [id number]  PCP:  Avon Gully, MD  Cardiologist:  Prentice Docker, MD  Electrophysiologist:  Lewayne Bunting, MD   Referring MD: Avon Gully, MD   Chief Complaint  Patient presents with  . Shortness of Breath    History of Present Illness:    [name] is a 85 y.o. female referred by [place] and Dr Purvis Sheffield for evaluation of aortic stenosis.   The patient has a longstanding history of a heart murmur.  She has been noted to have moderately severe aortic stenosis over serial echo studies.  She is also followed for chronic diastolic heart failure, coronary artery disease, paroxysmal atrial fibrillation, and sick sinus syndrome status post permanent pacemaker placement.  The patient has had recurrent gastrointestinal bleeding and has been off of all antiplatelet and anticoagulant drugs.  She had a major bleed previously when on rivaroxaban.  Her last blood transfusion in the setting of GI bleed was in 2019.  The patient is here with her daughter, [name], today. The patient has been widowed for over 20 years, but she continues to live independently. She has 24/7 care between caregivers and family members.  She is largely sedentary.  She is short of breath with low-level activity.  She has occasional chest discomfort but this is been self-limited.  She has had no lightheadedness, syncope, edema, orthopnea, or PND.  Her daughter feels like she has slowed down considerably in the last 6 months.  The patient's most recent echocardiogram demonstrated findings consistent with moderately severe aortic stenosis as well as severe tricuspid regurgitation.  Details of her echo are outlined below.  She presents today to discuss potential treatment options for her valvular heart disease.  Past Medical History:  Diagnosis Date  . Anemia   . Anxiety   . Aortic stenosis   . Atrial fibrillation (HCC)    . DJD (degenerative joint disease)   . Gastritis   . GI bleed   . Hypertension   . Osteoporosis   . Seasonal allergies   . Sleep apnea     Past Surgical History:  Procedure Laterality Date  . CHOLECYSTECTOMY    . ESOPHAGOGASTRODUODENOSCOPY N/A 05/16/2015   Procedure: ESOPHAGOGASTRODUODENOSCOPY (EGD);  Surgeon: West Bali, MD;  Location: [facility];  Service: Endoscopy;  Laterality: N/A;  . ESOPHAGOGASTRODUODENOSCOPY (EGD) WITH PROPOFOL N/A 12/04/2017   Procedure: ESOPHAGOGASTRODUODENOSCOPY (EGD) WITH PROPOFOL;  Surgeon: Corbin Ade, MD;  Location: [facility];  Service: Endoscopy;  Laterality: N/A;  . HEMORRHOID SURGERY    . PACEMAKER INSERTION    . PPM GENERATOR CHANGEOUT N/A 08/02/2016   Procedure: PPM Generator Changeout;  Surgeon: Marinus Maw, MD;  Location: [facility] INVASIVE CV LAB;  Service: Cardiovascular;  Laterality: N/A;  . TOTAL HIP ARTHROPLASTY      Current Medications: Current Meds  Medication Sig  . acetaminophen (TYLENOL) 500 MG tablet Take 500 mg by mouth at bedtime.  Marland Kitchen amLODipine (NORVASC) 5 MG tablet Take 1 tablet (5 mg total) by mouth daily.  Marland Kitchen ammonium lactate (LAC-HYDRIN) 12 % lotion Apply 1 application topically daily as needed.  . ferrous sulfate 325 (65 FE) MG EC tablet Take 325 mg by mouth daily.   . fluticasone (FLONASE) 50 MCG/ACT nasal spray Place 2 sprays into both nostrils daily.  . furosemide (LASIX) 40 MG tablet TAKE 1 TABLET(40 MG) BY MOUTH TWICE DAILY  . lisinopril (PRINIVIL,ZESTRIL)  40 MG tablet Take 1 tablet (40 mg total) by mouth daily.  Marland Kitchen loratadine (CLARITIN) 10 MG tablet Take 1 tablet by mouth daily  . pantoprazole (PROTONIX) 40 MG tablet Take 1 tablet (40 mg total) by mouth 2 (two) times daily.  . potassium chloride SA (K-DUR,KLOR-CON) 20 MEQ tablet Take 1 tablet (20 mEq total) by mouth daily.     Allergies:   Patient has no known allergies.   Social History   Socioeconomic History  . Marital status: Widowed    Spouse  name: Not on file  . Number of children: Not on file  . Years of education: Not on file  . Highest education level: Not on file  Occupational History  . Not on file  Social Needs  . Financial resource strain: Not on file  . Food insecurity    Worry: Not on file    Inability: Not on file  . Transportation needs    Medical: Not on file    Non-medical: Not on file  Tobacco Use  . Smoking status: Never Smoker  . Smokeless tobacco: Never Used  Substance and Sexual Activity  . Alcohol use: No    Alcohol/week: 0.0 standard drinks  . Drug use: No  . Sexual activity: Not on file  Lifestyle  . Physical activity    Days per week: Not on file    Minutes per session: Not on file  . Stress: Not on file  Relationships  . Social Herbalist on phone: Not on file    Gets together: Not on file    Attends religious service: Not on file    Active member of club or organization: Not on file    Attends meetings of clubs or organizations: Not on file    Relationship status: Not on file  Other Topics Concern  . Not on file  Social History Narrative   Widowed   Daughter     Family History: The patient's family history includes Colon cancer in her father and paternal aunt; Crohn's disease in her son.  ROS:   Please see the history of present illness.    All other systems reviewed and are negative.  EKGs/Labs/Other Studies Reviewed:    The following studies were reviewed today: Echo 07-28-2018: IMPRESSIONS    1. The left ventricle has normal systolic function with an ejection fraction of 60-65%. The cavity size was normal. There is moderately increased left ventricular wall thickness. Left ventricular diastolic Doppler parameters are consistent with  restrictive filling. Elevated mean left atrial pressure.  2. The right ventricle has mildly reduced systolic function. The cavity was moderately enlarged. There is no increase in right ventricular wall thickness.  3. Left atrial  size was severely dilated.  4. Right atrial size was RA is massively dilated.  5. Small pericardial effusion.  6. The pericardial effusion is circumferential.  7. The aortic valve is tricuspid. Severely thickening of the aortic valve. Severe calcifcation of the aortic valve. Aortic valve regurgitation is mild by color flow Doppler. Moderate-severe stenosis of the aortic valve. Severe aortic annular  calcification noted.  8. The mitral valve is abnormal. Mild thickening of the mitral valve leaflet. Mild calcification of the mitral valve leaflet. There is severe mitral annular calcification present. Mitral valve regurgitation is mild to moderate by color flow Doppler.  Mild-moderate mitral valve stenosis.  9. The tricuspid valve is abnormal. Tricuspid valve regurgitation is severe. 10. There is hepatic vein systolic flow reversal consistent with  severe TR. 11. The aortic root is normal in size and structure. 12. Pulmonary hypertension is moderately to severel elevated, PASP is 64 mmHg. 13. The inferior vena cava was dilated in size with <50% respiratory variability.  FINDINGS  Left Ventricle: The left ventricle has normal systolic function, with an ejection fraction of 60-65%. The cavity size was normal. There is moderately increased left ventricular wall thickness. Left ventricular diastolic Doppler parameters are consistent  with restrictive filling. Elevated mean left atrial pressure  Right Ventricle: The right ventricle has mildly reduced systolic function. The cavity was moderately enlarged. There is no increase in right ventricular wall thickness. Pacing wire/catheter visualized in the right ventricle.  Left Atrium: Left atrial size was severely dilated.  Right Atrium: Right atrial size was RA is massively dilated. Right atrial pressure is estimated at 10 mmHg.  Interatrial Septum: No atrial level shunt detected by color flow Doppler.  Pericardium: A small pericardial effusion is  present. The pericardial effusion is circumferential.  Mitral Valve: The mitral valve is abnormal. Mild thickening of the mitral valve leaflet. Mild calcification of the mitral valve leaflet. There is severe mitral annular calcification present. Mitral valve regurgitation is mild to moderate by color flow  Doppler. Mild-moderate mitral valve stenosis.  Tricuspid Valve: The tricuspid valve is abnormal. Tricuspid valve regurgitation is severe by color flow Doppler. There is hepatic vein systolic flow reversal consistent with severe TR.  Aortic Valve: The aortic valve is tricuspid Severely thickening of the aortic valve. Severe calcifcation of the aortic valve. Aortic valve regurgitation is mild by color flow Doppler. There is Moderate-severe stenosis of the aortic valve, with a  calculated valve area of 0.91 cm. Severe aortic annular calcification noted.  Pulmonic Valve: The pulmonic valve was not well visualized. Pulmonic valve regurgitation is not visualized by color flow Doppler. No evidence of pulmonic stenosis.  Aorta: The aortic root is normal in size and structure.  Pulmonary Artery: Pulmonary hypertension is moderately to severel elevated, PASP is 64 mmHg.  Venous: The inferior vena cava is dilated in size with less than 50% respiratory variability.    +--------------+--------++ LEFT VENTRICLE         +----------------+---------++ +--------------+--------++ Diastology                PLAX 2D                +----------------+---------++ +--------------+--------++ LV e' lateral:  6.81 cm/s LVIDd:        4.13 cm  +----------------+---------++ +--------------+--------++ LV E/e' lateral:25.8      LVIDs:        2.36 cm  +----------------+---------++ +--------------+--------++ LV e' medial:   5.76 cm/s LV PW:        1.40 cm  +----------------+---------++ +--------------+--------++ LV E/e' medial: 30.6      LV IVS:       1.63 cm   +----------------+---------++ +--------------+--------++ LVOT diam:    2.10 cm  +--------------+--------++ LV SV:        56 ml    +--------------+--------++ LV SV Index:  32.12    +--------------+--------++ LVOT Area:    3.46 cm +--------------+--------++                        +--------------+--------++  +---------------+------++ RIGHT VENTRICLE       +---------------+------++ TAPSE (M-mode):1.4 cm +---------------+------++  +---------------+-------++-----------++ LEFT ATRIUM           Index       +---------------+-------++-----------++ LA diam:  4.60 cm2.70 cm/m  +---------------+-------++-----------++ LA Vol (A2C):  76.0 ml44.54 ml/m +---------------+-------++-----------++ LA Vol (A4C):  78.2 ml45.83 ml/m +---------------+-------++-----------++ LA Biplane Vol:77.3 ml45.30 ml/m +---------------+-------++-----------++ +------------+---------++------------++ RIGHT ATRIUM         Index        +------------+---------++------------++ RA Area:    52.00 cm             +------------+---------++------------++ RA Volume:  248.00 ml145.35 ml/m +------------+---------++------------++  +------------------+------------++ AORTIC VALVE                   +------------------+------------++ AV Area (Vmax):   0.86 cm     +------------------+------------++ AV Area (Vmean):  0.92 cm     +------------------+------------++ AV Area (VTI):    0.91 cm     +------------------+------------++ AV Vmax:          292.62 cm/s  +------------------+------------++ AV Vmean:         210.478 cm/s +------------------+------------++ AV VTI:           0.659 m      +------------------+------------++ AV Peak Grad:     34.3 mmHg    +------------------+------------++ AV Mean Grad:     20.5 mmHg    +------------------+------------++ LVOT Vmax:        73.07 cm/s    +------------------+------------++ LVOT Vmean:       55.900 cm/s  +------------------+------------++ LVOT VTI:         0.173 m      +------------------+------------++ LVOT/AV VTI ratio:0.26         +------------------+------------++ AR PHT:           589 msec     +------------------+------------++   +-------------+-------++ AORTA                +-------------+-------++ Ao Root diam:2.50 cm +-------------+-------++  +--------------+----------++    +---------------+-----------++ MITRAL VALVE                TRICUSPID VALVE            +--------------+----------++    +---------------+-----------++ MV Area (PHT):5.66 cm      TR Peak grad:  49.0 mmHg   +--------------+----------++    +---------------+-----------++ MV Peak grad: 14.6 mmHg     TR Vmax:       350.00 cm/s +--------------+----------++    +---------------+-----------++ MV Mean grad: 5.0 mmHg   +--------------+----------++    +--------------+-------+ MV Vmax:      1.91 m/s      SHUNTS                +--------------+----------++    +--------------+-------+ MV Vmean:     105.0 cm/s    Systemic VTI: 0.17 m  +--------------+----------++    +--------------+-------+ MV VTI:       0.42 m        Systemic Diam:2.10 cm +--------------+----------++    +--------------+-------+ MV PHT:       38.86 msec +--------------+----------++ MV Decel Time:134 msec   +--------------+----------++ +----------------+-----------++ MR Peak grad:   136.4 mmHg  +----------------+-----------++ MR Mean grad:   71.0 mmHg   +----------------+-----------++ MR Vmax:        584.00 cm/s +----------------+-----------++ MR Vmean:       382.0 cm/s  +----------------+-----------++ MR PISA:        3.08 cm    +----------------+-----------++ MR PISA Eff ROA:16 mm      +----------------+-----------++ MR PISA Radius: 0.70 cm      +----------------+-----------++ +--------------+-----------++ MV E velocity:176.00 cm/s +--------------+-----------++ MV A velocity:87.40 cm/s  +--------------+-----------++ MV  E/A ratio: 2.01        +--------------+-----------++   EKG:  EKG is not ordered today.   Recent Labs: 11/28/2017: B Natriuretic Peptide 244.0 12/03/2017: ALT 12 12/05/2017: Magnesium 1.9 12/06/2017: BUN 31; Creatinine, Ser 1.11; Hemoglobin 9.2; Platelets 150; Potassium 3.7; Sodium 139  Recent Lipid Panel No results found for: CHOL, TRIG, HDL, CHOLHDL, VLDL, LDLCALC, LDLDIRECT  Physical Exam:    VS:  BP (!) 126/54   Pulse 66   Ht  (1.6 m)   Wt 145 lb (65.8 kg)   SpO2 97%   BMI 25.69 kg/m     Wt Readings from Last 3 Encounters:  09/07/18 145 lb (65.8 kg)  07/23/18 149 lb (67.6 kg)  03/29/18 146 lb (66.2 kg)     GEN: Very pleasant elderly woman, in a wheelchair, in no acute distress HEENT: Normal NECK: No JVD; bilateral carotid bruits LYMPHATICS: No lymphadenopathy CARDIAC: RRR, 3/6 harsh mid peaking systolic murmur at the right upper sternal border and 3/6 holosystolic murmur at the left lower sternal border RESPIRATORY:  Clear to auscultation without rales, wheezing or rhonchi  ABDOMEN: Soft, non-tender, non-distended MUSCULOSKELETAL:  No edema; No deformity  SKIN: Warm and dry NEUROLOGIC:  Alert and oriented x 3 PSYCHIATRIC:  Normal affect   ASSESSMENT:    1. Nonrheumatic aortic valve stenosis   2. Severe tricuspid regurgitation   3. Chronic diastolic heart failure due to valvular disease (HCC)    PLAN:    In order of problems listed above:  1. I have reviewed the natural history of aortic stenosis with the patient and their family members who are present today. We have discussed the limitations of medical therapy and the poor prognosis associated with symptomatic aortic stenosis. We have reviewed potential treatment options, including palliative medical therapy,  conventional surgical aortic valve replacement, and transcatheter aortic valve replacement. We discussed treatment options in the context of the patient's specific comorbid medical conditions.  I have personally reviewed the patient's echo images.  She has calcification and restriction of all 3 aortic valve leaflets.  Peak and mean transvalvular gradients are 34 and 20 mmHg, respectively.  Calculated aortic valve area 0.9 cm.  Findings are consistent with moderately severe aortic stenosis. 2. The patient's echocardiogram demonstrates findings consistent with severe tricuspid regurgitation.  She has massive right atrial enlargement and severe TR.  She also has evidence of pulmonary hypertension. 3. The patient has New York Heart Association functional class III symptoms of chronic diastolic heart failure.  Discussion: Considering the patient's very advanced age of 85 years old, poor functional capacity, and at least mild cognitive impairment, I think she would be best treated with conservative medical therapy.  In addition, she has severe concomitant tricuspid regurgitation and I suspect the benefit of proceeding with TAVR would not outweigh the risks of further testing and treatment.  The patient clearly agrees with this approach and feels she has had a very good quality of life to date.  She is not inclined to undergo the necessary work-up for TAVR at this point.  I would be happy to see her in the future if anything changes, but I suspect a conservative approach to her care will be appropriate moving forward.   Medication Adjustments/Labs and Tests Ordered: Current medicines are reviewed at length with the patient today.  Concerns regarding medicines are outlined above.  No orders of the defined types were placed in this encounter.  No orders of the defined types were placed in this  encounter.   Patient Instructions  Please call us if you need anything!    Signed, Tonny BollmanMichael Jazyah Butsch, MD  09/07/2018  12:17 PM    [facility]

## 2018-09-07 NOTE — Patient Instructions (Signed)
Please call us if you need anything.

## 2018-09-15 ENCOUNTER — Encounter (HOSPITAL_COMMUNITY): Payer: Self-pay | Admitting: *Deleted

## 2018-09-15 ENCOUNTER — Emergency Department (HOSPITAL_COMMUNITY): Payer: Medicare Other

## 2018-09-15 ENCOUNTER — Inpatient Hospital Stay (HOSPITAL_COMMUNITY)
Admission: EM | Admit: 2018-09-15 | Discharge: 2018-09-22 | DRG: 308 | Disposition: E | Payer: Medicare Other | Attending: Family Medicine | Admitting: Family Medicine

## 2018-09-15 ENCOUNTER — Other Ambulatory Visit: Payer: Self-pay

## 2018-09-15 DIAGNOSIS — R0602 Shortness of breath: Secondary | ICD-10-CM

## 2018-09-15 DIAGNOSIS — Z20828 Contact with and (suspected) exposure to other viral communicable diseases: Secondary | ICD-10-CM | POA: Diagnosis present

## 2018-09-15 DIAGNOSIS — I462 Cardiac arrest due to underlying cardiac condition: Secondary | ICD-10-CM | POA: Diagnosis present

## 2018-09-15 DIAGNOSIS — J9601 Acute respiratory failure with hypoxia: Secondary | ICD-10-CM | POA: Diagnosis present

## 2018-09-15 DIAGNOSIS — D649 Anemia, unspecified: Secondary | ICD-10-CM | POA: Diagnosis present

## 2018-09-15 DIAGNOSIS — I350 Nonrheumatic aortic (valve) stenosis: Secondary | ICD-10-CM | POA: Diagnosis present

## 2018-09-15 DIAGNOSIS — Z79899 Other long term (current) drug therapy: Secondary | ICD-10-CM

## 2018-09-15 DIAGNOSIS — Z66 Do not resuscitate: Secondary | ICD-10-CM | POA: Diagnosis present

## 2018-09-15 DIAGNOSIS — I442 Atrioventricular block, complete: Secondary | ICD-10-CM | POA: Diagnosis present

## 2018-09-15 DIAGNOSIS — I5032 Chronic diastolic (congestive) heart failure: Secondary | ICD-10-CM | POA: Diagnosis present

## 2018-09-15 DIAGNOSIS — Z950 Presence of cardiac pacemaker: Secondary | ICD-10-CM

## 2018-09-15 DIAGNOSIS — M810 Age-related osteoporosis without current pathological fracture: Secondary | ICD-10-CM | POA: Diagnosis present

## 2018-09-15 DIAGNOSIS — Z515 Encounter for palliative care: Secondary | ICD-10-CM | POA: Diagnosis not present

## 2018-09-15 DIAGNOSIS — I110 Hypertensive heart disease with heart failure: Secondary | ICD-10-CM | POA: Diagnosis present

## 2018-09-15 DIAGNOSIS — Z800 Family history of malignant neoplasm of digestive organs: Secondary | ICD-10-CM

## 2018-09-15 DIAGNOSIS — I472 Ventricular tachycardia: Secondary | ICD-10-CM | POA: Diagnosis not present

## 2018-09-15 DIAGNOSIS — J9690 Respiratory failure, unspecified, unspecified whether with hypoxia or hypercapnia: Secondary | ICD-10-CM

## 2018-09-15 DIAGNOSIS — I4891 Unspecified atrial fibrillation: Secondary | ICD-10-CM | POA: Diagnosis present

## 2018-09-15 DIAGNOSIS — I2510 Atherosclerotic heart disease of native coronary artery without angina pectoris: Secondary | ICD-10-CM | POA: Diagnosis present

## 2018-09-15 LAB — BASIC METABOLIC PANEL
Anion gap: 18 — ABNORMAL HIGH (ref 5–15)
BUN: 22 mg/dL (ref 8–23)
CO2: 17 mmol/L — ABNORMAL LOW (ref 22–32)
Calcium: 8.6 mg/dL — ABNORMAL LOW (ref 8.9–10.3)
Chloride: 95 mmol/L — ABNORMAL LOW (ref 98–111)
Creatinine, Ser: 1.15 mg/dL — ABNORMAL HIGH (ref 0.44–1.00)
GFR calc Af Amer: 47 mL/min — ABNORMAL LOW (ref >60)
GFR calc non Af Amer: 40 mL/min — ABNORMAL LOW (ref >60)
Glucose, Bld: 184 mg/dL — ABNORMAL HIGH (ref 70–99)
Potassium: 4.5 mmol/L (ref 3.5–5.1)
Sodium: 130 mmol/L — ABNORMAL LOW (ref 135–145)

## 2018-09-15 LAB — CBC WITH DIFFERENTIAL/PLATELET
Abs Immature Granulocytes: 0.14 10*3/uL — ABNORMAL HIGH (ref 0.00–0.07)
Basophils Absolute: 0 10*3/uL (ref 0.0–0.1)
Basophils Relative: 0 %
Eosinophils Absolute: 0 10*3/uL (ref 0.0–0.5)
Eosinophils Relative: 0 %
HCT: 35.8 % — ABNORMAL LOW (ref 36.0–46.0)
Hemoglobin: 10.8 g/dL — ABNORMAL LOW (ref 12.0–15.0)
Immature Granulocytes: 1 %
Lymphocytes Relative: 8 %
Lymphs Abs: 1.4 10*3/uL (ref 0.7–4.0)
MCH: 27.1 pg (ref 26.0–34.0)
MCHC: 30.2 g/dL (ref 30.0–36.0)
MCV: 89.9 fL (ref 80.0–100.0)
Monocytes Absolute: 1.2 10*3/uL — ABNORMAL HIGH (ref 0.1–1.0)
Monocytes Relative: 7 %
Neutro Abs: 14.6 10*3/uL — ABNORMAL HIGH (ref 1.7–7.7)
Neutrophils Relative %: 84 %
Platelets: 191 10*3/uL (ref 150–400)
RBC: 3.98 MIL/uL (ref 3.87–5.11)
RDW: 14.6 % (ref 11.5–15.5)
WBC: 17.3 10*3/uL — ABNORMAL HIGH (ref 4.0–10.5)
nRBC: 0.3 % — ABNORMAL HIGH (ref 0.0–0.2)

## 2018-09-15 LAB — SARS CORONAVIRUS 2 BY RT PCR (HOSPITAL ORDER, PERFORMED IN ~~LOC~~ HOSPITAL LAB): SARS Coronavirus 2: NEGATIVE

## 2018-09-15 LAB — TROPONIN I (HIGH SENSITIVITY): Troponin I (High Sensitivity): 76 ng/L — ABNORMAL HIGH (ref <18)

## 2018-09-15 MED ORDER — MORPHINE 100MG IN NS 100ML (1MG/ML) PREMIX INFUSION
1.0000 mg/h | INTRAVENOUS | Status: DC
  Administered 2018-09-16: 1 mg/h via INTRAVENOUS
  Filled 2018-09-15 (×2): qty 100

## 2018-09-15 MED ORDER — ETOMIDATE 2 MG/ML IV SOLN
INTRAVENOUS | Status: AC | PRN
  Administered 2018-09-15: 15 mg via INTRAVENOUS

## 2018-09-15 MED ORDER — EPINEPHRINE 1 MG/10ML IJ SOSY
PREFILLED_SYRINGE | INTRAMUSCULAR | Status: AC | PRN
  Administered 2018-09-15: 1 mg via INTRAVENOUS

## 2018-09-15 MED ORDER — ATROPINE SULFATE 1 MG/ML IJ SOLN
INTRAMUSCULAR | Status: AC | PRN
  Administered 2018-09-15: 1 mg via INTRAVENOUS

## 2018-09-15 MED ORDER — FUROSEMIDE 10 MG/ML IJ SOLN: 40.0000 mg | Freq: Once | INTRAMUSCULAR | Status: DC

## 2018-09-15 MED ORDER — SUCCINYLCHOLINE CHLORIDE 20 MG/ML IJ SOLN
INTRAMUSCULAR | Status: AC | PRN
  Administered 2018-09-15: 150 mg via INTRAVENOUS

## 2018-09-15 MED ORDER — MORPHINE SULFATE (PF) 4 MG/ML IV SOLN
4.0000 mg | INTRAVENOUS | Status: DC | PRN
  Administered 2018-09-15: 23:00:00 4 mg via INTRAVENOUS

## 2018-09-15 MED ORDER — MORPHINE SULFATE (PF) 4 MG/ML IV SOLN
INTRAVENOUS | Status: AC
  Administered 2018-09-15: 23:00:00 4 mg via INTRAVENOUS
  Filled 2018-09-15: qty 1

## 2018-09-15 MED ORDER — PROPOFOL 1000 MG/100ML IV EMUL
5.0000 ug/kg/min | INTRAVENOUS | Status: DC
  Administered 2018-09-15: 22:00:00 5 ug/kg/min via INTRAVENOUS

## 2018-09-15 MED ORDER — PROPOFOL 1000 MG/100ML IV EMUL
INTRAVENOUS | Status: AC
  Administered 2018-09-15: 5 ug/kg/min via INTRAVENOUS
  Filled 2018-09-15: qty 100

## 2018-09-15 MED ORDER — SODIUM CHLORIDE 0.9 % IV BOLUS
500.0000 mL | Freq: Once | INTRAVENOUS | Status: AC
  Administered 2018-09-15: 500 mL via INTRAVENOUS

## 2018-09-15 NOTE — ED Triage Notes (Signed)
Pt's daughter states pt started to feel weak last night; pt is c/o sob and pain to bilateral ribs; pt is usually able to do her own  ADL's with assistance but daughter states pt has been having trouble today; daughter states pt has had no appetite and decrease in urine output

## 2018-09-15 NOTE — H&P (Addendum)
TRH H&P    Patient Demographics:    [name], is a 85 y.o. female  MRN: [id number]  DOB - July 23, 1920  Admit Date - 08/28/2018  Referring MD/NP/PA: Donnetta HutchingBrian Cook  Outpatient Primary MD for the patient is Avon GullyFanta, Tesfaye, MD  Patient coming from: Home  Chief complaint-shortness of breath   HPI:    Ashley Fusinnie Bert  is a 85 y.o. female with history of atrial fibrillation, hypertension, chronic diastolic CHF, status post PPM placement for complete heart block came to the ED with shortness of breath.  In the ED CODE BLUE was initiated after patient went into V. tach.  Patient underwent cardiopulmonary arrest, she was intubated and put on mechanical ventilation.  Later family decided to make her DNR and wanted withdrawal of ventilator.  Mechanical ventilation has been discontinued.  Patient is actively dying, has significant hypotension, tachypnea.  Diprivan infusion was discontinued and patient started on morphine 1 mg/h.    Review of systems:    In addition to the HPI above,  No other review of systems obtainable   Past History of the following :    Past Medical History:  Diagnosis Date  . Anemia   . Anxiety   . Aortic stenosis   . Atrial fibrillation (HCC)   . DJD (degenerative joint disease)   . Gastritis   . GI bleed   . Hypertension   . Osteoporosis   . Seasonal allergies   . Sleep apnea       Past Surgical History:  Procedure Laterality Date  . CHOLECYSTECTOMY    . ESOPHAGOGASTRODUODENOSCOPY N/A 05/16/2015   Procedure: ESOPHAGOGASTRODUODENOSCOPY (EGD);  Surgeon: West BaliSandi L Fields, MD;  [facility];  Service: Endoscopy;  Laterality: N/A;  . ESOPHAGOGASTRODUODENOSCOPY (EGD) WITH PROPOFOL N/A 12/04/2017   Procedure: ESOPHAGOGASTRODUODENOSCOPY (EGD) WITH PROPOFOL;  Surgeon: Corbin Adeourk, Robert M, MD;  Location[facility];  Service: Endoscopy;  Laterality: N/A;  . HEMORRHOID SURGERY    .  PACEMAKER INSERTION    . PPM GENERATOR CHANGEOUT N/A 08/02/2016   Procedure: PPM Generator Changeout;  Surgeon: Marinus Mawaylor, Gregg W, MD;  Location: [facility];  Service: Cardiovascular;  Laterality: N/A;  . TOTAL HIP ARTHROPLASTY        Social History:      Social History   Tobacco Use  . Smoking status: Never Smoker  . Smokeless tobacco: Never Used  Substance Use Topics  . Alcohol use: No    Alcohol/week: 0.0 standard drinks       Family History :     Family History  Problem Relation Age of Onset  . Colon cancer Father   . Colon cancer Paternal Aunt   . Crohn's disease Son       Home Medications:   Prior to Admission medications   Medication Sig Start Date End Date Taking? Authorizing Provider  acetaminophen (TYLENOL) 500 MG tablet Take 500 mg by mouth at bedtime.   Yes [provider]  amLODipine (NORVASC) 5 MG tablet Take 1 tablet (5 mg total) by mouth daily.  05/25/14  Yes Laqueta LindenKoneswaran, Suresh A, MD  ferrous sulfate 325 (65 FE) MG EC tablet Take 325 mg by mouth daily.    Yes [provider]  furosemide (LASIX) 40 MG tablet TAKE 1 TABLET(40 MG) BY MOUTH TWICE DAILY Patient taking differently: Take 40 mg by mouth 2 (two) times daily.  02/24/18  Yes Johnson, Clanford L, MD  lisinopril (PRINIVIL,ZESTRIL) 40 MG tablet Take 1 tablet (40 mg total) by mouth daily. 12/08/17  Yes Johnson, Clanford L, MD  loratadine (CLARITIN) 10 MG tablet Take 1 tablet by mouth daily 04/09/16  Yes [provider]  pantoprazole (PROTONIX) 40 MG tablet Take 1 tablet (40 mg total) by mouth 2 (two) times daily. 12/06/17  Yes Johnson, Clanford L, MD  potassium chloride SA (K-DUR,KLOR-CON) 20 MEQ tablet Take 1 tablet (20 mEq total) by mouth daily. 12/06/17  Yes Johnson, Clanford L, MD     Allergies:    No Known Allergies   Physical Exam:   Vitals  Blood pressure (!) 76/49, pulse 85, temperature 98.3 F (36.8 C), temperature source Oral, resp. rate (!) 37, height 5'  3" (1.6 m), weight 65.8 kg, SpO2 94 %.  1.  General: Appears stuporous  2. Psychiatric: Not tested  3. Neurologic: Stuporous  4. HEENMT:  Atraumatic normocephalic  5. Respiratory : Clear to auscultation bilaterally  6. Cardiovascular : S1-S2, regular, no murmur auscultated  7. Gastrointestinal:  Abdomen is soft, nontender, no organomegaly      Data Review:    CBC Recent Labs  Lab 09-30-2018 2136  WBC 17.3*  HGB 10.8*  HCT 35.8*  PLT 191  MCV 89.9  MCH 27.1  MCHC 30.2  RDW 14.6  LYMPHSABS 1.4  MONOABS 1.2*  EOSABS 0.0  BASOSABS 0.0   ------------------------------------------------------------------------------------------------------------------  Results for orders placed or performed during the hospital encounter of 09-30-2018 (from the past 48 hour(s))  SARS Coronavirus 2 [facility](Hospital order, [facility]) Nasopharyngeal Nasopharyngeal Swab     Status: None   Collection Time: 09-30-2018  9:34 PM   Specimen: Nasopharyngeal Swab  Result Value Ref Range   SARS Coronavirus 2 NEGATIVE NEGATIVE    Comment: (NOTE) If result is NEGATIVE SARS-CoV-2 target nucleic acids are NOT DETECTED. The SARS-CoV-2 RNA is generally detectable in upper and lower  respiratory specimens during the acute phase of infection. The lowest  concentration of SARS-CoV-2 viral copies this assay can detect is 250  copies / mL. A negative result does not preclude SARS-CoV-2 infection  and should not be used as the sole basis for treatment or other  patient management decisions.  A negative result may occur with  improper specimen collection / handling, submission of specimen other  than nasopharyngeal swab, presence of viral mutation(s) within the  areas targeted by this assay, and inadequate number of viral copies  (<250 copies / mL). A negative result must be combined with clinical  observations, patient history, and epidemiological information. If result is POSITIVE  SARS-CoV-2 target nucleic acids are DETECTED. The SARS-CoV-2 RNA is generally detectable in upper and lower  respiratory specimens dur ing the acute phase of infection.  Positive  results are indicative of active infection with SARS-CoV-2.  Clinical  correlation with patient history and other diagnostic information is  necessary to determine patient infection status.  Positive results do  not rule out bacterial infection or co-infection with other viruses. If result is PRESUMPTIVE POSTIVE SARS-CoV-2 nucleic acids MAY BE PRESENT.   A presumptive positive result  was obtained on the submitted specimen  and confirmed on repeat testing.  While 2019 novel coronavirus  (SARS-CoV-2) nucleic acids may be present in the submitted sample  additional confirmatory testing may be necessary for epidemiological  and / or clinical management purposes  to differentiate between  SARS-CoV-2 and other Sarbecovirus currently known to infect humans.  If clinically indicated additional testing with an alternate test  methodology [phone]) is advised. The SARS-CoV-2 RNA is generally  detectable in upper and lower respiratory sp ecimens during the acute  phase of infection. The expected result is Negative. Fact Sheet for Patients:  StrictlyIdeas.no Fact Sheet for Healthcare Providers: BankingDealers.co.za This test is not yet approved or cleared by the [place] FDA and has been authorized for detection and/or diagnosis of SARS-CoV-2 by FDA under an Emergency Use Authorization (EUA).  This EUA will remain in effect (meaning this test can be used) for the duration of the COVID-19 declaration under Section 564(b)(1) of the Act, 21 U.S.C. section 360bbb-3(b)(1), unless the authorization is terminated or revoked sooner. Performed at [facility], [address] [place], [place] [address]   CBC with Differential     Status: Abnormal   Collection Time: 08/30/2018   9:36 PM  Result Value Ref Range   WBC 17.3 (H) 4.0 - 10.5 K/uL   RBC 3.98 3.87 - 5.11 MIL/uL   Hemoglobin 10.8 (L) 12.0 - 15.0 g/dL   HCT 35.8 (L) 36.0 - 46.0 %   MCV 89.9 80.0 - 100.0 fL   MCH 27.1 26.0 - 34.0 pg   MCHC 30.2 30.0 - 36.0 g/dL   RDW 14.6 11.5 - 15.5 %   Platelets 191 150 - 400 K/uL   nRBC 0.3 (H) 0.0 - 0.2 %   Neutrophils Relative % 84 %   Neutro Abs 14.6 (H) 1.7 - 7.7 K/uL   Lymphocytes Relative 8 %   Lymphs Abs 1.4 0.7 - 4.0 K/uL   Monocytes Relative 7 %   Monocytes Absolute 1.2 (H) 0.1 - 1.0 K/uL   Eosinophils Relative 0 %   Eosinophils Absolute 0.0 0.0 - 0.5 K/uL   Basophils Relative 0 %   Basophils Absolute 0.0 0.0 - 0.1 K/uL   Immature Granulocytes 1 %   Abs Immature Granulocytes 0.14 (H) 0.00 - 0.07 K/uL    Comment: Performed at [facility], [address], [place], [place] [address]  Basic metabolic panel     Status: Abnormal   Collection Time: 09/07/2018  9:36 PM  Result Value Ref Range   Sodium 130 (L) 135 - 145 mmol/L   Potassium 4.5 3.5 - 5.1 mmol/L   Chloride 95 (L) 98 - 111 mmol/L   CO2 17 (L) 22 - 32 mmol/L   Glucose, Bld 184 (H) 70 - 99 mg/dL   BUN 22 8 - 23 mg/dL   Creatinine, Ser 1.15 (H) 0.44 - 1.00 mg/dL   Calcium 8.6 (L) 8.9 - 10.3 mg/dL   GFR calc non Af Amer 40 (L) >60 mL/min   GFR calc Af Amer 47 (L) >60 mL/min   Anion gap 18 (H) 5 - 15    Comment: Performed at [facility], [address], [place], [place] [address]  Troponin I (High Sensitivity)     Status: Abnormal   Collection Time: 09/12/2018  9:36 PM  Result Value Ref Range   Troponin I (High Sensitivity) 76 (H) <18 ng/L    Comment: (NOTE) Elevated high sensitivity troponin I (hsTnI) values and significant  changes  across serial measurements may suggest ACS but many other  chronic and acute conditions are known to elevate hsTnI results.  Refer to the "Links" section for chest pain algorithms and additional  guidance. Performed at [facility], [address], [place],  [place] [address]     Chemistries  Recent Labs  Lab 10-13-2018 2136  NA 130*  K 4.5  CL 95*  CO2 17*  GLUCOSE 184*  BUN 22  CREATININE 1.15*  CALCIUM 8.6*   ------------------------------------------------------------------------------------------------------------------  - --------------------------------------------------------------------------------------------------------------- Urine analysis:    Component Value Date/Time   COLORURINE YELLOW 12/03/2017 0920   APPEARANCEUR CLEAR 12/03/2017 0920   LABSPEC 1.020 12/03/2017 0920   PHURINE 5.0 12/03/2017 0920   GLUCOSEU NEGATIVE 12/03/2017 0920   HGBUR NEGATIVE 12/03/2017 0920   BILIRUBINUR NEGATIVE 12/03/2017 0920   KETONESUR NEGATIVE 12/03/2017 0920   PROTEINUR NEGATIVE 12/03/2017 0920   UROBILINOGEN 0.2 11/10/2006 1331   NITRITE NEGATIVE 12/03/2017 0920   LEUKOCYTESUR NEGATIVE 12/03/2017 0920      Imaging Results:    Dg Chest Port 1 View  Result Date: 10/13/2018 CLINICAL DATA:  Shortness of breath. EXAM: PORTABLE CHEST 1 VIEW COMPARISON:  Radiographs of March 29, 2018. FINDINGS: Stable cardiomegaly. Atherosclerosis of thoracic aorta is noted. Left-sided pacemaker is unchanged in position. Endotracheal tube is seen with distal tip 8 cm above the carina. No pneumothorax or pleural effusion is noted. No acute pulmonary disease is noted. Bony thorax is unremarkable. IMPRESSION: No acute abnormality is noted. Aortic Atherosclerosis (ICD10-I70.0). Electronically Signed   By: Lupita Raider M.D.   On: 10/13/2018 21:41      Assessment & Plan:    Active Problems:   End of life care   1. End-of-life care-patient is actively dying, started on morphine 1 mg/h , titrate to 5 mg/h for comfort, Zofran PRN for nausea, vomiting.  Will start Robinul 1 mg 3 times daily as needed for secretions.  Morphine 2 mg IV every 2 hours as needed for breakthrough pain/dyspnea.  2. Status post PPM-patient has permanent pacemaker for complete heart  block.  [employer] was called by the ED nurse, and as per [employer] they will not turn of the pacemaker level to turn off ICD.   3. Goals of care-discussed with family in detail, patient is actively dying.  Anticipate hospital death,  prognosis is hours to days.   DVT Prophylaxis-   none  AM Labs Ordered, also please review Full Orders  Family Communication: Discussed with family at bedside.  Patient is actively dying.  Code Status: DNR  Admission status: Inpatient: Based on patients clinical presentation and evaluation of above clinical data, I have made determination that patient meets Inpatient criteria at this time.  Time spent in minutes : 60 minutes   Meredeth Ide M.D on October 13, 2018 at 11:49 PM

## 2018-09-15 NOTE — ED Notes (Signed)
This RN spoke with, Robbie Lis ([employer] representative), he stated that they do not deactivate pacemakers.

## 2018-09-15 NOTE — ED Notes (Signed)
Patient extubated at this time.

## 2018-09-15 NOTE — ED Notes (Signed)
This RN spoke Loa Socks, Music therapist, stated that she would send out a local representative to deactivate pacemaker

## 2018-09-15 NOTE — ED Provider Notes (Addendum)
[facility] EMERGENCY DEPARTMENT Provider Note   CSN: [id number] Arrival date & time: 09/02/2018  1943     History   Chief Complaint Chief Complaint  Patient presents with  . Shortness of Breath    HPI [name] is a 85 y.o. female.     Level 5 caveat for acuity of condition.  Patient presents with dyspnea followed by ventricular tachycardia.  Patient was initially assigned to our PA, but I intervened acutely.  Past medical history includes aortic stenosis, atrial fibrillation, hypertension, pacemaker insertion, many others.  Daughter reports patient was weak and dyspneic last night, and was having trouble doing her ADLs today.  Decreased appetite and urinary output were noted.     Past Medical History:  Diagnosis Date  . Anemia   . Anxiety   . Aortic stenosis   . Atrial fibrillation ([place])   . DJD (degenerative joint disease)   . Gastritis   . GI bleed   . Hypertension   . Osteoporosis   . Seasonal allergies   . Sleep apnea     Patient Active Problem List   Diagnosis Date Noted  . Cameron lesion, acute   . Gastric erosion   . Generalized weakness 12/03/2017  . Heme positive stool 12/03/2017  . Acute blood loss anemia 12/03/2017  . GERD (gastroesophageal reflux disease) 05/07/2017  . Hiatal hernia 05/07/2017  . Dysphagia 02/27/2017  . Malnutrition of moderate degree 06/03/2015  . Hospital acquired PNA 05/23/2015  . HCAP (healthcare-associated pneumonia) 05/23/2015  . Palliative care encounter   . DNR (do not resuscitate) discussion   . Acute GI bleeding   . Severe anemia 05/14/2015  . Melena 05/14/2015  . GI bleed 05/14/2015  . Atrial fibrillation ([place]) 04/26/2015  . Severe tricuspid regurgitation 03/15/2015  . CHF (congestive heart failure) ([place]) 03/08/2015  . Systolic murmur 68/34/1962  . CAD (coronary artery disease) 05/25/2014  . DYSLIPIDEMIA 03/14/2010  . Essential hypertension, benign 03/20/2009  . Bradycardia 03/20/2009  . Cardiac pacemaker in  situ 03/20/2009    Past Surgical History:  Procedure Laterality Date  . CHOLECYSTECTOMY    . ESOPHAGOGASTRODUODENOSCOPY N/A 05/16/2015   Procedure: ESOPHAGOGASTRODUODENOSCOPY (EGD);  Surgeon: Danie Binder, MD;  [facility];  Service: Endoscopy;  Laterality: N/A;  . ESOPHAGOGASTRODUODENOSCOPY (EGD) WITH PROPOFOL N/A 12/04/2017   Procedure: ESOPHAGOGASTRODUODENOSCOPY (EGD) WITH PROPOFOL;  Surgeon: Daneil Dolin, MD;  [facility];  Service: Endoscopy;  Laterality: N/A;  . HEMORRHOID SURGERY    . PACEMAKER INSERTION    . PPM GENERATOR CHANGEOUT N/A 08/02/2016   Procedure: PPM Generator Changeout;  Surgeon: Evans Lance, MD;  Location: [place] CV LAB;  Service: Cardiovascular;  Laterality: N/A;  . TOTAL HIP ARTHROPLASTY       OB History   No obstetric history on file.      Home Medications    Prior to Admission medications   Medication Sig Start Date End Date Taking? Authorizing Provider  acetaminophen (TYLENOL) 500 MG tablet Take 500 mg by mouth at bedtime.   Yes [provider]  amLODipine (NORVASC) 5 MG tablet Take 1 tablet (5 mg total) by mouth daily. 05/25/14  Yes Herminio Commons, MD  ferrous sulfate 325 (65 FE) MG EC tablet Take 325 mg by mouth daily.    Yes [provider]  furosemide (LASIX) 40 MG tablet TAKE 1 TABLET(40 MG) BY MOUTH TWICE DAILY Patient taking differently: Take 40 mg by mouth 2 (two) times daily.  02/24/18  Yes Johnson, Clanford L, MD  lisinopril (PRINIVIL,ZESTRIL) 40 MG tablet Take 1 tablet (40 mg total) by mouth daily. 12/08/17  Yes Johnson, Clanford L, MD  loratadine (CLARITIN) 10 MG tablet Take 1 tablet by mouth daily 04/09/16  Yes [provider]  pantoprazole (PROTONIX) 40 MG tablet Take 1 tablet (40 mg total) by mouth 2 (two) times daily. 12/06/17  Yes Johnson, Clanford L, MD  potassium chloride SA (K-DUR,KLOR-CON) 20 MEQ tablet Take 1 tablet (20 mEq total) by mouth daily. 12/06/17  Yes Cleora FleetJohnson,  Clanford L, MD    Family History Family History  Problem Relation Age of Onset  . Colon cancer Father   . Colon cancer Paternal Aunt   . Crohn's disease Son     Social History Social History   Tobacco Use  . Smoking status: Never Smoker  . Smokeless tobacco: Never Used  Substance Use Topics  . Alcohol use: No    Alcohol/week: 0.0 standard drinks  . Drug use: No     Allergies   Patient has no known allergies.   Review of Systems Review of Systems  Unable to perform ROS: Acuity of condition     Physical Exam Updated Vital Signs BP (!) 61/43   Pulse 74   Temp 98.3 F (36.8 C) (Oral)   Resp 17   Ht 5\' 3"  (1.6 m)   Wt 65.8 kg   SpO2 99%   BMI 25.69 kg/m   Physical Exam Vitals signs and nursing note reviewed.  Constitutional:      Comments: Obtunded, ventricular tachycardia on monitor  HENT:     Head: Normocephalic and atraumatic.  Eyes:     Conjunctiva/sclera: Conjunctivae normal.  Neck:     Musculoskeletal: Neck supple.  Cardiovascular:     Rate and Rhythm: Normal rate and regular rhythm.  Pulmonary:     Effort: Pulmonary effort is normal.     Breath sounds: Normal breath sounds.  Abdominal:     General: Bowel sounds are normal.     Palpations: Abdomen is soft.  Musculoskeletal:     Comments: Unable  Skin:    General: Skin is warm and dry.  Neurological:     Comments: Unable  Psychiatric:     Comments: Unable      ED Treatments / Results  Labs (all labs ordered are listed, but only abnormal results are displayed) Labs Reviewed  CBC WITH DIFFERENTIAL/PLATELET - Abnormal; Notable for the following components:      Result Value   WBC 17.3 (*)    Hemoglobin 10.8 (*)    HCT 35.8 (*)    nRBC 0.3 (*)    Neutro Abs 14.6 (*)    Monocytes Absolute 1.2 (*)    Abs Immature Granulocytes 0.14 (*)    All other components within normal limits  BASIC METABOLIC PANEL - Abnormal; Notable for the following components:   Sodium 130 (*)    Chloride  95 (*)    CO2 17 (*)    Glucose, Bld 184 (*)    Creatinine, Ser 1.15 (*)    Calcium 8.6 (*)    GFR calc non Af Amer 40 (*)    GFR calc Af Amer 47 (*)    Anion gap 18 (*)    All other components within normal limits  TROPONIN I (HIGH SENSITIVITY) - Abnormal; Notable for the following components:   Troponin I (High Sensitivity) 76 (*)    All other components within normal limits  SARS  CORONAVIRUS 2 (HOSPITAL ORDER, [facility]FOR[facility])  TROPONIN I (HIGH SENSITIVITY)    EKG None  Radiology Dg Chest Port 1 View  Result Date: 2019/01/01 CLINICAL DATA:  Shortness of breath. EXAM: PORTABLE CHEST 1 VIEW COMPARISON:  Radiographs of March 29, 2018. FINDINGS: Stable cardiomegaly. Atherosclerosis of thoracic aorta is noted. Left-sided pacemaker is unchanged in position. Endotracheal tube is seen with distal tip 8 cm above the carina. No pneumothorax or pleural effusion is noted. No acute pulmonary disease is noted. Bony thorax is unremarkable. IMPRESSION: No acute abnormality is noted. Aortic Atherosclerosis (ICD10-I70.0). Electronically Signed   By: Lupita RaiderJames  Green Jr M.D.   On: 02020/12/11 21:41    Procedures Procedure Name: Intubation Date/Time: 2019/01/01 9:45 PM Performed by: Donnetta Hutchingook, Hjalmar Ballengee, MD Pre-anesthesia Checklist: Suction available, Emergency Drugs available and Patient being monitored Oxygen Delivery Method: Ambu bag Preoxygenation: Pre-oxygenation with 100% oxygen Induction Type: Rapid sequence Ventilation: Mask ventilation without difficulty Laryngoscope Size: Glidescope and 3 Grade View: Grade I Tube size: 7.5 mm Number of attempts: 1 Airway Equipment and Method: Video-laryngoscopy and Stylet Placement Confirmation: ETT inserted through vocal cords under direct vision Tube secured with: ETT holder Comments: Patient intubated without difficulty.      (including critical care time)  Medications Ordered in ED Medications  furosemide (LASIX) injection 40 mg  (40 mg Intravenous Not Given 01-09-2019 2155)  propofol (DIPRIVAN) 1000 MG/100ML infusion (has no administration in time range)  morphine 4 MG/ML injection 4 mg (4 mg Intravenous Given 01-09-2019 2238)  EPINEPHrine (ADRENALIN) 1 MG/10ML injection (1 mg Intravenous Given 01-09-2019 2113)  atropine injection (1 mg Intravenous Given 01-09-2019 2114)  etomidate (AMIDATE) injection (15 mg Intravenous Given 01-09-2019 2118)  succinylcholine (ANECTINE) injection (150 mg Intravenous Given 01-09-2019 2118)  sodium chloride 0.9 % bolus 500 mL (500 mLs Intravenous New Bag/Given 01-09-2019 2156)     Initial Impression / Assessment and Plan / ED Course  I have reviewed the triage vital signs and the nursing notes.  Pertinent labs & imaging results that were available during my care of the patient were reviewed by me and considered in my medical decision making (see chart for details).       Code blue was initiated.  CPR performed by RN.  Electric shock given to patient.  IV epinephrine and atropine administered..  Patient intubated with a 7.5 endotracheal tube without difficulty.  Blood pressure remains soft.  I discussed the care with the daughter.  She chose the option of no further aggressive interventions.  Patient was then extubated and she was breathing spontaneously.  Blood pressure remained low.  Patient admitted to hospitalist via Dr. Sharl MaLama.   Rhythm converted to a paced rhythm.  Patient was extubated in family's request.  She continued to have spontaneous respirations with a pulse ox in the mid 90s.  This was discussed with the hospitalist. CRITICAL CARE Performed by: Donnetta HutchingBrian Eiley Mcginnity  ?  Total critical care time: 95 minutes  Critical care time was exclusive of separately billable procedures and treating other patients.  Critical care was necessary to treat or prevent imminent or life-threatening deterioration.  Critical care was time spent personally by me on the following activities: development of treatment  plan with patient and/or surrogate as well as nursing, discussions with consultants, evaluation of patient's response to treatment, examination of patient, obtaining history from patient or surrogate, ordering and performing treatments and interventions, ordering and review of laboratory studies, ordering and review of radiographic studies, pulse oximetry and re-evaluation  of patient's condition. Service.   Final Clinical Impressions(s) / ED Diagnoses   Final diagnoses:  Respiratory failure, unspecified chronicity, unspecified whether with hypoxia or hypercapnia [facility])    ED Discharge Orders    None       Donnetta Hutching, MD Sep 25, 2018 2316    Donnetta Hutching, MD 09-25-18 2320

## 2018-09-15 NOTE — ED Notes (Signed)
Family requests to d/c vent, discussed with EDP, verbal order given to d/c vent, respiratory called

## 2018-09-15 NOTE — ED Notes (Signed)
Pt rhythm vtach, shocked at 150 joules per Dr Lacinda Axon with return of pulses and rhythm change to sinus tach, Dr Lacinda Axon remains at bedside,

## 2018-09-16 ENCOUNTER — Other Ambulatory Visit: Payer: Self-pay

## 2018-09-16 DIAGNOSIS — R0602 Shortness of breath: Secondary | ICD-10-CM | POA: Diagnosis not present

## 2018-09-16 DIAGNOSIS — I462 Cardiac arrest due to underlying cardiac condition: Secondary | ICD-10-CM | POA: Diagnosis present

## 2018-09-16 DIAGNOSIS — I472 Ventricular tachycardia: Secondary | ICD-10-CM | POA: Diagnosis present

## 2018-09-16 DIAGNOSIS — I350 Nonrheumatic aortic (valve) stenosis: Secondary | ICD-10-CM | POA: Diagnosis present

## 2018-09-16 DIAGNOSIS — I110 Hypertensive heart disease with heart failure: Secondary | ICD-10-CM | POA: Diagnosis present

## 2018-09-16 DIAGNOSIS — Z79899 Other long term (current) drug therapy: Secondary | ICD-10-CM | POA: Diagnosis not present

## 2018-09-16 DIAGNOSIS — Z800 Family history of malignant neoplasm of digestive organs: Secondary | ICD-10-CM | POA: Diagnosis not present

## 2018-09-16 DIAGNOSIS — D649 Anemia, unspecified: Secondary | ICD-10-CM | POA: Diagnosis present

## 2018-09-16 DIAGNOSIS — Z950 Presence of cardiac pacemaker: Secondary | ICD-10-CM | POA: Diagnosis not present

## 2018-09-16 DIAGNOSIS — I442 Atrioventricular block, complete: Secondary | ICD-10-CM | POA: Diagnosis present

## 2018-09-16 DIAGNOSIS — Z515 Encounter for palliative care: Secondary | ICD-10-CM

## 2018-09-16 DIAGNOSIS — J9601 Acute respiratory failure with hypoxia: Secondary | ICD-10-CM | POA: Diagnosis present

## 2018-09-16 DIAGNOSIS — I5032 Chronic diastolic (congestive) heart failure: Secondary | ICD-10-CM | POA: Diagnosis present

## 2018-09-16 DIAGNOSIS — I2510 Atherosclerotic heart disease of native coronary artery without angina pectoris: Secondary | ICD-10-CM | POA: Diagnosis present

## 2018-09-16 DIAGNOSIS — M810 Age-related osteoporosis without current pathological fracture: Secondary | ICD-10-CM | POA: Diagnosis present

## 2018-09-16 DIAGNOSIS — Z66 Do not resuscitate: Secondary | ICD-10-CM | POA: Diagnosis present

## 2018-09-16 DIAGNOSIS — I4891 Unspecified atrial fibrillation: Secondary | ICD-10-CM | POA: Diagnosis present

## 2018-09-16 DIAGNOSIS — Z20828 Contact with and (suspected) exposure to other viral communicable diseases: Secondary | ICD-10-CM | POA: Diagnosis present

## 2018-09-16 MED ORDER — ACETAMINOPHEN 650 MG RE SUPP: 650.0000 mg | Freq: Four times a day (QID) | RECTAL | Status: DC | PRN

## 2018-09-16 MED ORDER — MORPHINE SULFATE (PF) 2 MG/ML IV SOLN: 2.0000 mg | INTRAVENOUS | Status: DC | PRN

## 2018-09-16 MED ORDER — ONDANSETRON HCL 4 MG PO TABS: 4.0000 mg | ORAL_TABLET | Freq: Four times a day (QID) | ORAL | Status: DC | PRN

## 2018-09-16 MED ORDER — GLYCOPYRROLATE 1 MG PO TABS: 1.0000 mg | ORAL_TABLET | Freq: Three times a day (TID) | ORAL | Status: DC | PRN

## 2018-09-16 MED ORDER — ACETAMINOPHEN 325 MG PO TABS: 650.0000 mg | ORAL_TABLET | Freq: Four times a day (QID) | ORAL | Status: DC | PRN

## 2018-09-16 MED ORDER — LORAZEPAM 2 MG/ML IJ SOLN
2.0000 mg | INTRAMUSCULAR | Status: DC | PRN
  Administered 2018-09-16: 04:00:00 2 mg via INTRAVENOUS
  Filled 2018-09-16: qty 1

## 2018-09-16 MED ORDER — ONDANSETRON HCL 4 MG/2ML IJ SOLN: 4.0000 mg | Freq: Four times a day (QID) | INTRAMUSCULAR | Status: DC | PRN

## 2018-09-16 MED ORDER — SODIUM CHLORIDE 0.9 % IV SOLN: INTRAVENOUS | Status: DC

## 2018-09-17 MED FILL — Medication: Qty: 1 | Status: AC

## 2018-09-22 NOTE — Discharge Summary (Signed)
[name] DOB: 10/12/1920 DOA: October 10, 2018  PCP: Rosita Fire, MD PCP/Office notified:  Admit date: 10-Oct-2018 Date of Death: Oct 11, 2018  Final Diagnoses:  Principal Problem:   End of life care Active Problems:   CAD (coronary artery disease)   History of present illness:  HPI on Admission as documented by Admitting Physician:- - [name]  is a 85 y.o. female with history of atrial fibrillation, hypertension, chronic diastolic CHF, status post PPM placement for complete heart block came to the ED with shortness of breath.  In the ED CODE BLUE was initiated after patient went into V. tach.  Patient underwent cardiopulmonary arrest, she was intubated and put on mechanical ventilation.  Later family decided to make her DNR and wanted withdrawal of ventilator.  Mechanical ventilation has been discontinued.  Patient is actively dying, has significant hypotension, tachypnea.  Diprivan infusion was discontinued and patient started on morphine 1 mg/h.  Hospital Course:   1)End-of-life care----patient is status post CODE BLUE resuscitation on admission, per family request extubated, and made complete comfort measures -comfort/palliative care protocol with morphine and Robinul and Zofran initiated  2) history of cardiac arrhythmia -status post PPM-patient has permanent pacemaker for complete heart block.  [employer] was called by the ED nurse,   3)CAD--patient with history of underlying coronary artery disease  Date and time of death : Patient expired on 10-11-2018 at 0819 am with family at bedside  Signed:  Maximos Zayas  [facility] 11-Oct-2018, 10:15 AM

## 2018-09-22 NOTE — Progress Notes (Signed)
MORPHINE DRIP DISCONTINUED, WASTED IN SINK 70CC, WITNESSED BY Leila Schuff PARKER-DARLING RN & TIM GOINS RN SUPERVISOR.

## 2018-09-22 NOTE — Progress Notes (Signed)
0816-ENTERED ROOM TO ASSESS PT., FAMILY STATES THAT PT HAD JUST PASSED, PT ASSESSED, NO RESPIRATIONS OR HR NOTED. NURSING SUPERVISOR & PHYSICAN NOTIFIED. IV REMOVED, ANGIO INTACT, MULTIPLE FAMILY MEMBERS REMAIN AT THE BEDSIDE, REQUESTING THAT PT REMAIN UNTIL THEIR SISTER ARRIVES. CONDOLENCES OFFERED.

## 2018-09-22 NOTE — Plan of Care (Signed)
Patient is unresponsive. END of LIFE care given. Family at the bedside

## 2018-09-22 NOTE — Progress Notes (Signed)
Nutrition Brief Note  Chart reviewed. Pt now transitioning to comfort care.  No further nutrition interventions warranted at this time.  Please re-consult as needed.   [name] A. Mekiyah Gladwell, RD, LDN, CDCES Registered Dietitian II Certified Diabetes Care and Education Specialist Pager: [phone] After hours Pager: [phone]  

## 2018-09-22 DEATH — deceased

## 2018-10-12 ENCOUNTER — Ambulatory Visit: Payer: Medicare Other | Admitting: Cardiovascular Disease

## 2018-10-30 ENCOUNTER — Ambulatory Visit: Payer: Medicare Other | Admitting: Cardiovascular Disease

## 2019-08-19 IMAGING — CR DG CHEST 1V PORT
1 series · 1 of 1 positions shown · non-contrast
Comparison: 07/03/2017

CLINICAL DATA: Chest pain

EXAM:
PORTABLE CHEST 1 VIEW

[portable]
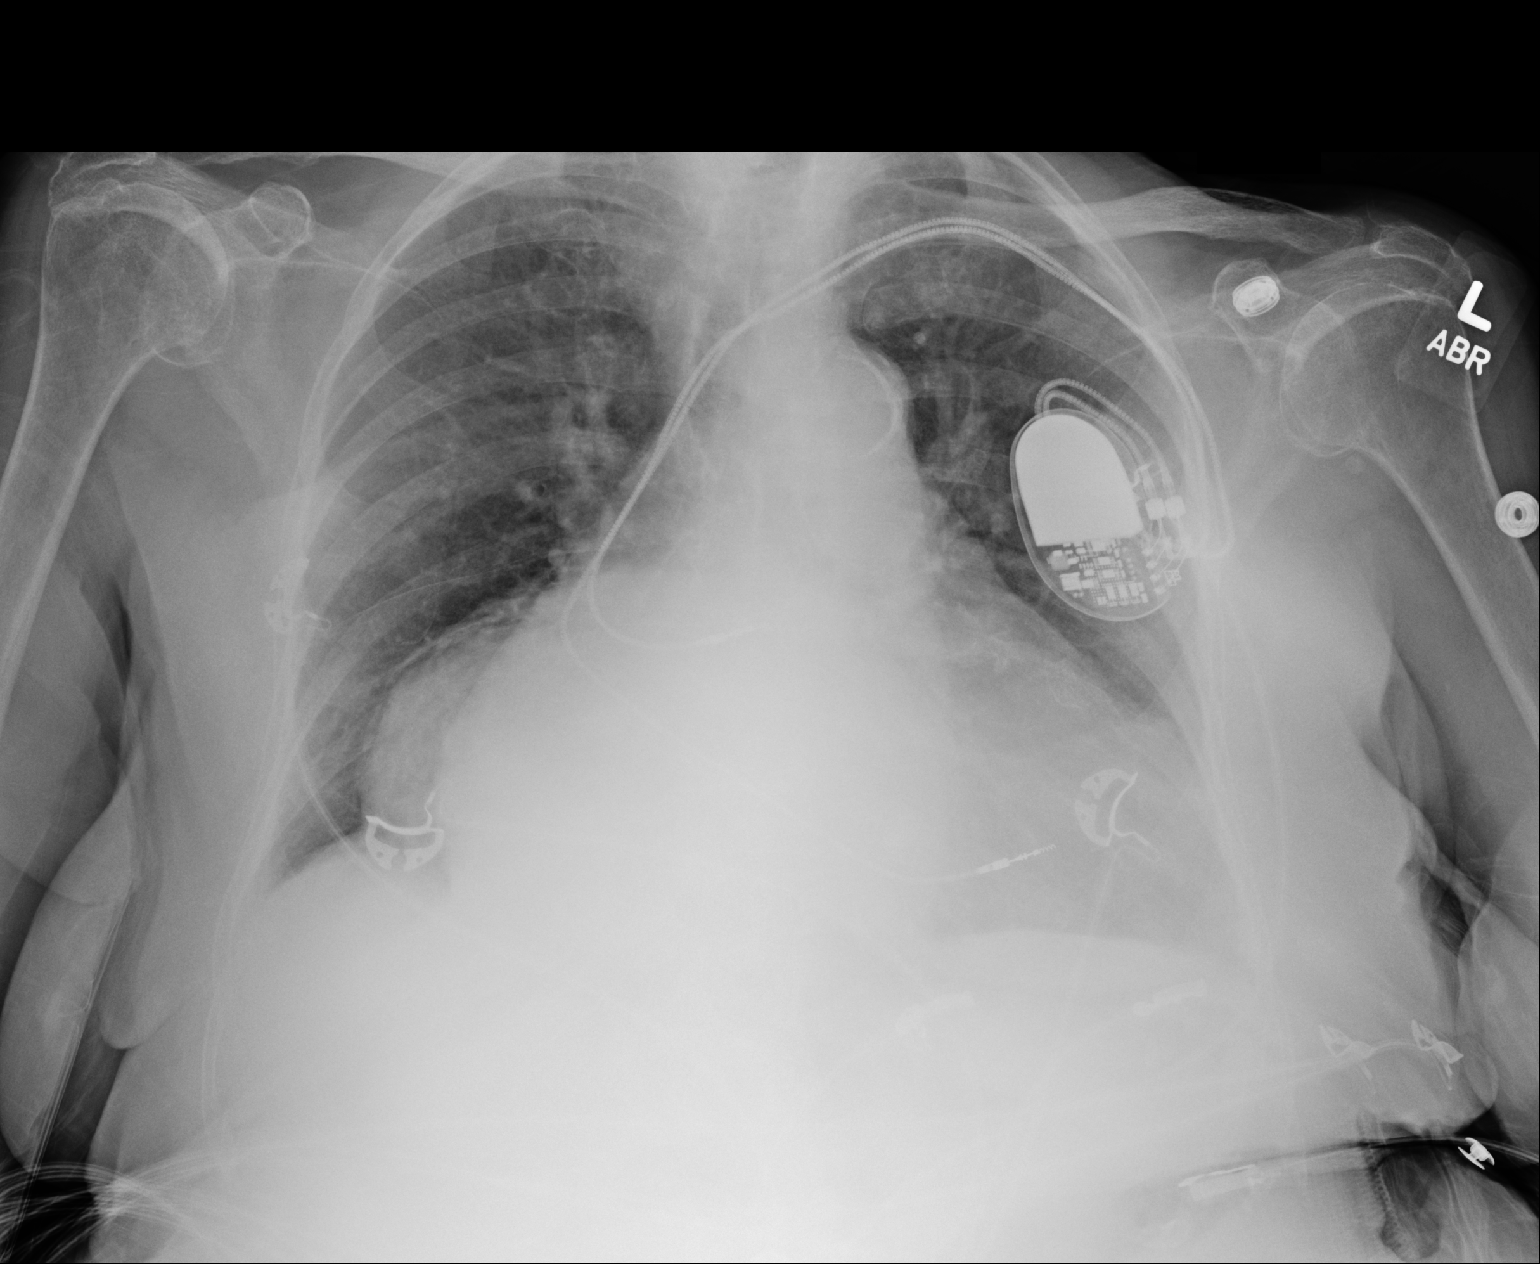

[1 of 1 positions shown; findings below may reference images not displayed]

FINDINGS: Marked cardiopericardial enlargement. There is also a superimposed
sizable hiatal hernia. Dual-chamber pacer leads from the left in
stable position. There is no edema, consolidation, effusion, or
pneumothorax.
IMPRESSION: No acute finding.

Cardiomegaly and large hiatal hernia.
